# Patient Record
Sex: Female | Born: 1960 | Race: Black or African American | Hispanic: No | Marital: Married | Smoking: Never smoker
Health system: Southern US, Community
[De-identification: ages and names within clinical notes are randomized; demographics above are authoritative.]

## PROBLEM LIST (undated history)

## (undated) DIAGNOSIS — I10 Essential (primary) hypertension: Secondary | ICD-10-CM

## (undated) DIAGNOSIS — Z86010 Personal history of colonic polyps: Secondary | ICD-10-CM

## (undated) DIAGNOSIS — IMO0002 Reserved for concepts with insufficient information to code with codable children: Secondary | ICD-10-CM

## (undated) DIAGNOSIS — G43909 Migraine, unspecified, not intractable, without status migrainosus: Secondary | ICD-10-CM

## (undated) DIAGNOSIS — F419 Anxiety disorder, unspecified: Secondary | ICD-10-CM

## (undated) DIAGNOSIS — D649 Anemia, unspecified: Secondary | ICD-10-CM

## (undated) DIAGNOSIS — I499 Cardiac arrhythmia, unspecified: Secondary | ICD-10-CM

## (undated) DIAGNOSIS — R87619 Unspecified abnormal cytological findings in specimens from cervix uteri: Secondary | ICD-10-CM

## (undated) DIAGNOSIS — E785 Hyperlipidemia, unspecified: Secondary | ICD-10-CM

## (undated) DIAGNOSIS — T7840XA Allergy, unspecified, initial encounter: Secondary | ICD-10-CM

## (undated) DIAGNOSIS — Z8619 Personal history of other infectious and parasitic diseases: Secondary | ICD-10-CM

## (undated) DIAGNOSIS — Z9889 Other specified postprocedural states: Secondary | ICD-10-CM

## (undated) DIAGNOSIS — G629 Polyneuropathy, unspecified: Secondary | ICD-10-CM

## (undated) DIAGNOSIS — K219 Gastro-esophageal reflux disease without esophagitis: Secondary | ICD-10-CM

## (undated) DIAGNOSIS — M545 Low back pain, unspecified: Secondary | ICD-10-CM

## (undated) DIAGNOSIS — I341 Nonrheumatic mitral (valve) prolapse: Secondary | ICD-10-CM

## (undated) DIAGNOSIS — Z9289 Personal history of other medical treatment: Secondary | ICD-10-CM

## (undated) DIAGNOSIS — R112 Nausea with vomiting, unspecified: Secondary | ICD-10-CM

## (undated) DIAGNOSIS — I2510 Atherosclerotic heart disease of native coronary artery without angina pectoris: Secondary | ICD-10-CM

## (undated) HISTORY — DX: Personal history of other infectious and parasitic diseases: Z86.19

## (undated) HISTORY — PX: COLONOSCOPY: SHX174

## (undated) HISTORY — DX: Low back pain: M54.5

## (undated) HISTORY — DX: Nonrheumatic mitral (valve) prolapse: I34.1

## (undated) HISTORY — DX: Anemia, unspecified: D64.9

## (undated) HISTORY — DX: Gastro-esophageal reflux disease without esophagitis: K21.9

## (undated) HISTORY — DX: Unspecified abnormal cytological findings in specimens from cervix uteri: R87.619

## (undated) HISTORY — PX: CRYOABLATION: SHX1415

## (undated) HISTORY — DX: Other specified postprocedural states: R11.2

## (undated) HISTORY — DX: Atherosclerotic heart disease of native coronary artery without angina pectoris: I25.10

## (undated) HISTORY — PX: WRIST SURGERY: SHX841

## (undated) HISTORY — DX: Anxiety disorder, unspecified: F41.9

## (undated) HISTORY — PX: CATARACT EXTRACTION, BILATERAL: SHX1313

## (undated) HISTORY — DX: Other specified postprocedural states: Z98.890

## (undated) HISTORY — DX: Polyneuropathy, unspecified: G62.9

## (undated) HISTORY — DX: Essential (primary) hypertension: I10

## (undated) HISTORY — DX: Personal history of other medical treatment: Z92.89

## (undated) HISTORY — DX: Nausea with vomiting, unspecified: R11.2

## (undated) HISTORY — DX: Low back pain, unspecified: M54.50

## (undated) HISTORY — DX: Reserved for concepts with insufficient information to code with codable children: IMO0002

## (undated) HISTORY — PX: ESOPHAGOGASTRODUODENOSCOPY: SHX1529

## (undated) HISTORY — DX: Migraine, unspecified, not intractable, without status migrainosus: G43.909

## (undated) HISTORY — DX: Hyperlipidemia, unspecified: E78.5

## (undated) HISTORY — DX: Allergy, unspecified, initial encounter: T78.40XA

## (undated) HISTORY — DX: Personal history of colonic polyps: Z86.010

## (undated) HISTORY — PX: HYSTEROSCOPY: SHX211

---

## 1997-11-30 ENCOUNTER — Other Ambulatory Visit: Admission: RE | Admit: 1997-11-30 | Discharge: 1997-11-30 | Payer: Self-pay | Admitting: Obstetrics and Gynecology

## 2004-07-02 ENCOUNTER — Ambulatory Visit: Payer: Self-pay | Admitting: Pulmonary Disease

## 2005-03-31 ENCOUNTER — Ambulatory Visit: Payer: Self-pay | Admitting: Pulmonary Disease

## 2007-04-29 DIAGNOSIS — Z9289 Personal history of other medical treatment: Secondary | ICD-10-CM

## 2007-04-29 HISTORY — DX: Personal history of other medical treatment: Z92.89

## 2008-01-24 ENCOUNTER — Telehealth: Payer: Self-pay | Admitting: Pulmonary Disease

## 2008-01-24 DIAGNOSIS — T7840XA Allergy, unspecified, initial encounter: Secondary | ICD-10-CM | POA: Insufficient documentation

## 2008-01-24 DIAGNOSIS — M545 Low back pain: Secondary | ICD-10-CM

## 2008-01-24 DIAGNOSIS — E7800 Pure hypercholesterolemia, unspecified: Secondary | ICD-10-CM

## 2008-01-24 DIAGNOSIS — I10 Essential (primary) hypertension: Secondary | ICD-10-CM | POA: Insufficient documentation

## 2008-01-24 DIAGNOSIS — K219 Gastro-esophageal reflux disease without esophagitis: Secondary | ICD-10-CM | POA: Insufficient documentation

## 2008-01-25 ENCOUNTER — Ambulatory Visit: Payer: Self-pay | Admitting: Pulmonary Disease

## 2008-01-25 DIAGNOSIS — J329 Chronic sinusitis, unspecified: Secondary | ICD-10-CM | POA: Insufficient documentation

## 2008-01-25 DIAGNOSIS — J0100 Acute maxillary sinusitis, unspecified: Secondary | ICD-10-CM

## 2008-01-25 DIAGNOSIS — J019 Acute sinusitis, unspecified: Secondary | ICD-10-CM | POA: Insufficient documentation

## 2008-01-25 DIAGNOSIS — F411 Generalized anxiety disorder: Secondary | ICD-10-CM | POA: Insufficient documentation

## 2008-01-25 DIAGNOSIS — I059 Rheumatic mitral valve disease, unspecified: Secondary | ICD-10-CM | POA: Insufficient documentation

## 2008-04-11 ENCOUNTER — Telehealth (INDEPENDENT_AMBULATORY_CARE_PROVIDER_SITE_OTHER): Payer: Self-pay | Admitting: *Deleted

## 2008-04-27 ENCOUNTER — Telehealth (INDEPENDENT_AMBULATORY_CARE_PROVIDER_SITE_OTHER): Payer: Self-pay | Admitting: *Deleted

## 2008-06-23 ENCOUNTER — Telehealth (INDEPENDENT_AMBULATORY_CARE_PROVIDER_SITE_OTHER): Payer: Self-pay | Admitting: *Deleted

## 2008-06-26 ENCOUNTER — Telehealth (INDEPENDENT_AMBULATORY_CARE_PROVIDER_SITE_OTHER): Payer: Self-pay | Admitting: *Deleted

## 2008-08-04 ENCOUNTER — Encounter: Payer: Self-pay | Admitting: Pulmonary Disease

## 2009-01-04 ENCOUNTER — Ambulatory Visit: Payer: Self-pay | Admitting: Pulmonary Disease

## 2009-01-04 DIAGNOSIS — R0609 Other forms of dyspnea: Secondary | ICD-10-CM

## 2009-01-04 DIAGNOSIS — R0989 Other specified symptoms and signs involving the circulatory and respiratory systems: Secondary | ICD-10-CM

## 2009-01-05 ENCOUNTER — Telehealth: Payer: Self-pay | Admitting: Pulmonary Disease

## 2009-01-05 ENCOUNTER — Telehealth (INDEPENDENT_AMBULATORY_CARE_PROVIDER_SITE_OTHER): Payer: Self-pay | Admitting: *Deleted

## 2009-01-05 DIAGNOSIS — J309 Allergic rhinitis, unspecified: Secondary | ICD-10-CM | POA: Insufficient documentation

## 2009-01-12 ENCOUNTER — Telehealth: Payer: Self-pay | Admitting: Adult Health

## 2009-01-12 ENCOUNTER — Ambulatory Visit: Payer: Self-pay | Admitting: Pulmonary Disease

## 2009-01-12 ENCOUNTER — Encounter: Payer: Self-pay | Admitting: Adult Health

## 2009-02-19 ENCOUNTER — Telehealth (INDEPENDENT_AMBULATORY_CARE_PROVIDER_SITE_OTHER): Payer: Self-pay | Admitting: *Deleted

## 2009-04-19 ENCOUNTER — Telehealth: Payer: Self-pay | Admitting: Pulmonary Disease

## 2009-05-15 ENCOUNTER — Ambulatory Visit: Payer: Self-pay | Admitting: Pulmonary Disease

## 2009-07-27 ENCOUNTER — Telehealth (INDEPENDENT_AMBULATORY_CARE_PROVIDER_SITE_OTHER): Payer: Self-pay | Admitting: *Deleted

## 2009-08-02 ENCOUNTER — Telehealth: Payer: Self-pay | Admitting: Pulmonary Disease

## 2009-08-07 ENCOUNTER — Encounter: Payer: Self-pay | Admitting: Pulmonary Disease

## 2009-08-22 ENCOUNTER — Encounter: Payer: Self-pay | Admitting: Pulmonary Disease

## 2009-08-28 ENCOUNTER — Encounter: Payer: Self-pay | Admitting: Pulmonary Disease

## 2009-08-31 ENCOUNTER — Encounter: Payer: Self-pay | Admitting: Pulmonary Disease

## 2010-02-19 ENCOUNTER — Encounter (INDEPENDENT_AMBULATORY_CARE_PROVIDER_SITE_OTHER): Payer: Self-pay | Admitting: *Deleted

## 2010-05-28 NOTE — Letter (Signed)
Summary: Pre Visit Letter Revised  [facility]  [address] [place], [place] [address]   Phone: [phone]  Fax: [phone]        02/19/2010 MRN: [id number] [facility] [address] [place], [place]  [address]             Procedure Date:  04/02/2010   Welcome to the Gastroenterology Division at [facility].    You are scheduled to see a nurse for your pre-procedure visit on 03/26/2010 at 8:00AM on the 3rd floor at [facility], [address].  We ask that you try to arrive at our office 15 minutes prior to your appointment time to allow for check-in.  Please take a minute to review the attached form.  If you answer "Yes" to one or more of the questions on the first page, we ask that you call the person listed at your earliest opportunity.  If you answer "No" to all of the questions, please complete the rest of the form and bring it to your appointment.    Your nurse visit will consist of discussing your medical and surgical history, your immediate family medical history, and your medications.   If you are unable to list all of your medications on the form, please bring the medication bottles to your appointment and we will list them.  We will need to be aware of both prescribed and over the counter drugs.  We will need to know exact dosage information as well.    Please be prepared to read and sign documents such as consent forms, a financial agreement, and acknowledgement forms.  If necessary, and with your consent, a friend or relative is welcome to sit-in on the nurse visit with you.  Please bring your insurance card so that we may make a copy of it.  If your insurance requires a referral to see a specialist, please bring your referral form from your primary care physician.  No co-pay is required for this nurse visit.     If you cannot keep your appointment, please call [phone] to cancel or reschedule prior to your appointment date.  This  allows [name] the opportunity to schedule an appointment for another patient in need of care.    Thank you for choosing [facility] for your medical needs.  We appreciate the opportunity to care for you.  Please visit [name] at our website  to learn more about our practice.  Sincerely, The Gastroenterology Division

## 2010-05-28 NOTE — Assessment & Plan Note (Signed)
Summary: ROV ///kp   Primary Care Provider:  Dr. Alroy Dust  CC:  16 month ROV & review of mult medical problems....  History of Present Illness: 50 y/o BF here for a follow up visit... she has hx of HBP & Hyperchol followed regularly by [facility] and she says that he does all her lab work... she also has Allergies (DrSharma), and GERD/ LER prev eval by DrByers on Protonix...   ~  January 25, 2008:  she presents today after a 2.5 yr hiatus because her new insurance plan requires a $62 co-pay for the specialist, and I am her primary... c/o sinus infection w/ headache, facial pain and pressure, congestion, drainage, sl sore throat... she denies fever, cough, discolored phlegm... she states she knows it's her allergies because she is forced to walk past the smokers outside at work and this always bothers her allergies...    ~  May 15, 2009:  returns for routine ROV w/ no new complaints or concerns... she needs new Rx for her Allegra & Protonix... she continues to see [facility] yearly for her MVP, HBP, dyslipidemia, & +FamHx CAD (last seen 4/10)- she tells me that he does all of her blood work & she doesn't want to have any labs repeated here... she saw TP 9/10 for work-in appt due to allergic rhinitis requiring Pred & Nasacort to resolve...  DrMcPhail is her GYN... she will be 49 this month & wants to proceed w/ colonoscopy.    Current Problem List:  ALLERGY (ICD-995.3) - followed by DrSharma on ALLEGRA 180mg /d... hx pos allergy testing over the yrs w/ + reactions to pollen, tress, grasses, ragweed, molds, dust, dogs/cats, & some foods peanuts & spinach were the worst for her... she also uses NASACORT AQ Prn.  HYPERTENSION (ICD-401.9) - followed by Howell Rucks on TENORMIN 50mg /d...  BP= 122/68 here & similar at home, takes med regularly 7 tol well... denies HA, visual changes, CP, palipit, dizziness, syncope, dyspnea, edema, etc...   ~  2DEcho 2/06 showed norm LVwall thickness & LVF, borderline MVP/  LAE/ tr MR...  ~  Cardiolite 12/07 showed norm perfusion and no ischemia...  Hx of MITRAL VALVE PROLAPSE (ICD-424.0) - followed by Howell Rucks.  HYPERCHOLESTEROLEMIA (ICD-272.0) - also followed by [facility] on ZOCOR 40mg /d... he does all her lab work...  ~  4/10: note from drKelly indicates FLP- TChol 169, TG 66, HDL 79, LDL 83  GERD (ICD-530.81) & LER - prev eval by DrByers for ENT and well controlled on PROTONIX 40mg /d (she prefers this to Nexium)...  BACK PAIN, LUMBAR (ICD-724.2) - she treats this w/ OTC Advil/ Tylenol which works well...  ANXIETY (ICD-300.00) - on XANAX 0.5mg  Prn... she's been under mod stress (works for a [employer])...  Health Maintenance - she sees [facility]Phail in [facility] for [facility]YN (last 10/10 & she reports all neg)... Mammograms at [facility]... she will be 49 this month & wants to proceed w/ her routine colonoscopy...    Allergies: 1)  ! Sulfa  Comments:  Nurse/Medical Assistant: The patient's medications and allergies were reviewed with the patient and were updated in the Medication and Allergy Lists.  Past History:  Past Medical History:  ALLERGIC RHINITIS (ICD-477.9) HYPERTENSION (ICD-401.9) MITRAL VALVE PROLAPSE (ICD-424.0) HYPERCHOLESTEROLEMIA (ICD-272.0) GERD (ICD-530.81) BACK PAIN, LUMBAR (ICD-724.2) ANXIETY (ICD-300.00)  Past Surgical History: Hysteroscopy Wrist surgery C-Section x 2  Family History: Reviewed history from 01/25/2008 and no changes required. Mother passed at age 13 from breast ca, hx of heart dz & DM. Father has  prostate cancer Sister has DM  Social History: Reviewed history from 01/25/2008 and no changes required. Never smoker No ETOH Married 2 Investment banker, operational  Review of Systems      See HPI  The patient denies anorexia, fever, weight loss, weight gain, vision loss, decreased hearing, hoarseness, chest pain, syncope, dyspnea on exertion, peripheral edema, prolonged cough, headaches, hemoptysis,  abdominal pain, melena, hematochezia, severe indigestion/heartburn, hematuria, incontinence, muscle weakness, suspicious skin lesions, transient blindness, difficulty walking, depression, unusual weight change, abnormal bleeding, enlarged lymph nodes, and angioedema.    Vital Signs:  Patient profile:   50 year old female Height:      63 inches Weight:      149 pounds BMI:     26.49 O2 Sat:      100 % on Room air Temp:     97.3 degrees F oral Pulse rate:   58 / minute BP sitting:   122 / 68  (left arm) Cuff size:   regular  Vitals Entered By: Randell Loop CMA (May 15, 2009 3:01 PM)  O2 Sat at Rest %:  100 O2 Flow:  Room air CC: 16 month ROV & review of mult medical problems... Is Patient Diabetic? No Pain Assessment Patient in pain? no      Comments meds updated today   Physical Exam  Additional Exam:  WD, WN, 50 y/o BF in NAD... GENERAL:  Alert & oriented; pleasant & cooperative... HEENT:  /AT, EOM-wnl, PERRLA, EACs-clear, TMs-wnl, NOSE-clear, THROAT-clear & wnl. NECK:  Supple w/ full ROM; no JVD; normal carotid impulses w/o bruits; no thyromegaly or nodules palpated; no lymphadenopathy. CHEST:  Clear to P & A; no wheezes, rales, or rhonchi heard... HEART:  Regular Rhythm; without murmurs/ rubs/ or gallops detected. ABDOMEN:  Soft & nontender; normal bowel sounds; no organomegaly or masses palpated. EXT: without deformities or arthritic changes; no varicose veins/ venous insuffic/ or edema. NEURO:  CN's intact; no focal neuro deficits... DERM:  No lesions noted; no rash etc...     Impression & Recommendations:  Problem # 1:  ALLERGIC RHINITIS (ICD-477.9) Chr problem... she takes allegra daily & requests refill... OK. Her updated medication list for this problem includes:    Allegra 180 Mg Tabs (Fexofenadine hcl) .[name]... Take 1 tablet by mouth once a day  Problem # 2:  HYPERTENSION (ICD-401.9) Controlled-  same Rx. Her updated medication list for this problem  includes:    Tenormin 50 Mg Tabs (Atenolol) .[name]... 1 once daily  Problem # 3:  MITRAL VALVE PROLAPSE (ICD-424.0) Stable-  she is currently asymptomatic... followed by Howell Rucks who does all her labs. Her updated medication list for this problem includes:    Tenormin 50 Mg Tabs (Atenolol) .[name]... 1 once daily  Problem # 4:  HYPERCHOLESTEROLEMIA (ICD-272.0) Followed by [facility] & he writes the Zocor... Her updated medication list for this problem includes:    Zocor 40 Mg Tabs (Simvastatin) .[name]... Take 1 tablet by mouth once a day  Problem # 5:  GERD (ICD-530.81) Stable on the Protonix-  refilled today. Her updated medication list for this problem includes:    Protonix 40 Mg Tbec (Pantoprazole sodium) .[name]... Take 1 tab by mouth once daily (30 min before the 1st meal of the day)  Orders: Gastroenterology Referral (GI)  Problem # 6:  ANXIETY (ICD-300.00) Stable-  she uses the Xanax sparingly & manages well... Her updated medication list for this problem includes:    Xanax 0.5 Mg Tabs (Alprazolam) .[name]... 1/2 to  1 by mouth three times a day as needed for nerves  Complete Medication List: 1)  Allegra 180 Mg Tabs (Fexofenadine hcl) .... Take 1 tablet by mouth once a day 2)  Tenormin 50 Mg Tabs (Atenolol) .[name].. 1 once daily 3)  Zocor 40 Mg Tabs (Simvastatin) .... Take 1 tablet by mouth once a day 4)  Protonix 40 Mg Tbec (Pantoprazole sodium) .... Take 1 tab by mouth once daily (30 min before the 1st meal of the day) 5)  Bcp  .... As directed 6)  Xanax 0.5 Mg Tabs (Alprazolam) .... 1/2 to 1 by mouth three times a day as needed for nerves  Other Orders: Prescription Created Electronically [id number])  Patient Instructions: 1)  Today we updated your med list- see below.... 2)  We refilled your Allegra & Protonix as requested... 3)  We will set up a referral to out GI Dept for a routine colonoscopy to be sched at your convenience... 4)  Call for any problems.Marland KitchenMarland Kitchen 5)  Please schedule a follow-up  appointment in 1 year, sooner as needed. Prescriptions: PROTONIX 40 MG TBEC (PANTOPRAZOLE SODIUM) take 1 tab by mouth once daily (30 min before the 1st meal of the day)  #30 x prn   Entered and Authorized by:   Michele Mcalpine MD   Signed by:   Michele Mcalpine MD on 05/15/2009   Method used:   Print then Give to Patient   RxID:   [id number] ALLEGRA 180 MG TABS (FEXOFENADINE HCL) Take 1 tablet by mouth once a day  #30 x prn   Entered and Authorized by:   Michele Mcalpine MD   Signed by:   Michele Mcalpine MD on 05/15/2009   Method used:   Print then Give to Patient   RxID:   [id number]

## 2010-05-28 NOTE — Progress Notes (Signed)
Summary: wants stonger meds  Phone Note Call from Patient Call back at [phone]   Caller: Patient Call For: nadel Summary of Call: needs dr Kirstie Mirza nurse to call patient. was given nasonex but its not working. patient wants something stonger for allergies  Initial call taken by: Valinda Hoar,  July 27, 2009 12:22 PM  Follow-up for Phone Call        Pt is requesting another nasal spray, she states nasonex is not helping with her allergies at all. Pt is also taking allegra daily. Please advise.Carron Curie CMA  July 27, 2009 1:43 PM [facility]  Additional Follow-up for Phone Call Additional follow up Details #1::        per SN---needs allergy tests....change nasonex to veramist  2 sprays each nostril at bedtime .  thanks Randell Loop CMA  July 27, 2009 2:17 PM     Additional Follow-up for Phone Call Additional follow up Details #2::    Spoke with pt and advised of SN's recs.  Pt states that she already sees an allergist and she refuses to have allergy testing due to cost.  I advised that she should at least see her allergist to discuss allergy issues and that we sent rx for veramyst to pharm.  Pt verbalized understanding. Follow-up by: Vernie Murders,  July 27, 2009 2:34 PM  New/Updated Medications: VERAMYST 27.5 MCG/SPRAY SUSP (FLUTICASONE FUROATE) 2 sprays each nostril at bedtime Prescriptions: VERAMYST 27.5 MCG/SPRAY SUSP (FLUTICASONE FUROATE) 2 sprays each nostril at bedtime  #1 x 6   Entered by:   Vernie Murders   Authorized by:   Michele Mcalpine MD   Signed by:   Vernie Murders on 07/27/2009   Method used:   Electron[facility] to        [facility]  [phone]* (retail)       [address] [place], [place]  [address]       Ph: [phone] or [phone]       Fax: [phone]   RxID:   [id number]

## 2010-05-28 NOTE — Letter (Signed)
Summary: [facility]   Imported By: Sherian Rein 09/10/2009 11:35:38  _____________________________________________________________________  External Attachment:    Type:   Image     Comment:   External Document

## 2010-05-28 NOTE — Medication Information (Signed)
Summary: Tax adviser   Imported By: Lehman Prom 08/07/2009 16:28:58  _____________________________________________________________________  External Attachment:    Type:   Image     Comment:   External Document

## 2010-05-28 NOTE — Progress Notes (Signed)
SummaryMadilyn Hook PA  Phone Note Outgoing Call   Call placed by: Vernie Murders,  August 02, 2009 3:21 PM Call placed to: Insurer Summary of Call: Spoke with [employer] to initiate PA for veramyst.  Will await fax to be sent to triage fax. Initial call taken by: Vernie Murders,  August 02, 2009 3:21 PM  Follow-up for Phone Call        received approval for veramyst 08/07/09-05/02/2012. pharmacy, [facility],  and pt aware.  Carron Curie CMA  August 06, 2009 5:27 PM

## 2010-07-05 ENCOUNTER — Encounter (INDEPENDENT_AMBULATORY_CARE_PROVIDER_SITE_OTHER): Payer: Self-pay | Admitting: *Deleted

## 2010-07-09 NOTE — Letter (Signed)
Summary: Pre Visit Letter Revised  [facility]  [address] [place], [place] [address]   Phone: [phone]  Fax: [phone]        07/05/2010 MRN: [id number] [facility] [address] [place], [place]  [address]             Procedure Date:  August 15, 2010   dir col Dr Leone Payor   Welcome to the Gastroenterology Division at [facility].    You are scheduled to see a nurse for your pre-procedure visit on August 02, 2010 at 10:00am on the 3rd floor at [facility], [address].  We ask that you try to arrive at our office 15 minutes prior to your appointment time to allow for check-in.  Please take a minute to review the attached form.  If you answer "Yes" to one or more of the questions on the first page, we ask that you call the person listed at your earliest opportunity.  If you answer "No" to all of the questions, please complete the rest of the form and bring it to your appointment.    Your nurse visit will consist of discussing your medical and surgical history, your immediate family medical history, and your medications.   If you are unable to list all of your medications on the form, please bring the medication bottles to your appointment and we will list them.  We will need to be aware of both prescribed and over the counter drugs.  We will need to know exact dosage information as well.    Please be prepared to read and sign documents such as consent forms, a financial agreement, and acknowledgement forms.  If necessary, and with your consent, a friend or relative is welcome to sit-in on the nurse visit with you.  Please bring your insurance card so that we may make a copy of it.  If your insurance requires a referral to see a specialist, please bring your referral form from your primary care physician.  No co-pay is required for this nurse visit.     If you cannot keep your appointment, please call [phone] to cancel or reschedule prior to  your appointment date.  This allows [name] the opportunity to schedule an appointment for another patient in need of care.    Thank you for choosing [facility] for your medical needs.  We appreciate the opportunity to care for you.  Please visit [name] at our website  to learn more about our practice.  Sincerely, The Gastroenterology Division

## 2010-08-01 ENCOUNTER — Ambulatory Visit (AMBULATORY_SURGERY_CENTER): Payer: BC Managed Care – PPO | Admitting: *Deleted

## 2010-08-01 VITALS — Ht 63.0 in | Wt 155.1 lb

## 2010-08-01 DIAGNOSIS — Z1211 Encounter for screening for malignant neoplasm of colon: Secondary | ICD-10-CM

## 2010-08-01 MED ORDER — PEG-KCL-NACL-NASULF-NA ASC-C 100 G PO SOLR: 1.0000 | Freq: Once | ORAL | Status: AC

## 2010-08-01 NOTE — Progress Notes (Signed)
Pt says that she gets nauseated w/ anesthesia. Ezra Sites

## 2010-08-14 ENCOUNTER — Encounter: Payer: Self-pay | Admitting: Internal Medicine

## 2010-08-15 ENCOUNTER — Encounter: Payer: Self-pay | Admitting: Internal Medicine

## 2010-08-15 ENCOUNTER — Ambulatory Visit (AMBULATORY_SURGERY_CENTER): Payer: BC Managed Care – PPO | Admitting: Internal Medicine

## 2010-08-15 VITALS — BP 154/78 | HR 103 | Temp 96.9°F | Resp 20 | Ht 63.0 in | Wt 155.0 lb

## 2010-08-15 DIAGNOSIS — Z860100 Personal history of colon polyps, unspecified: Secondary | ICD-10-CM

## 2010-08-15 DIAGNOSIS — D126 Benign neoplasm of colon, unspecified: Secondary | ICD-10-CM

## 2010-08-15 DIAGNOSIS — Z1211 Encounter for screening for malignant neoplasm of colon: Secondary | ICD-10-CM

## 2010-08-15 HISTORY — DX: Personal history of colon polyps, unspecified: Z86.0100

## 2010-08-15 HISTORY — DX: Personal history of colonic polyps: Z86.010

## 2010-08-15 MED ORDER — SODIUM CHLORIDE 0.9 % IV SOLN: 500.0000 mL | INTRAVENOUS | Status: DC

## 2010-08-15 NOTE — Patient Instructions (Signed)
Discharged  instructions given with verbal understanding. Handout on polyps given. No aspirin or aspirin products for two weeks.

## 2010-08-16 ENCOUNTER — Telehealth: Payer: Self-pay

## 2010-08-16 NOTE — Telephone Encounter (Signed)
No ID on  Answering machine. Unable to leave message.

## 2010-08-20 NOTE — Progress Notes (Signed)
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

## 2010-08-28 ENCOUNTER — Telehealth: Payer: Self-pay | Admitting: Internal Medicine

## 2010-08-28 NOTE — Telephone Encounter (Signed)
Ok Maybe not Should not be an issue until 3 years from now - it can be up to her then Letter on way benign polyp 3 year repeat

## 2010-08-28 NOTE — Telephone Encounter (Signed)
Patient advised.

## 2010-08-28 NOTE — Telephone Encounter (Signed)
Patient doesn't feel that she does need deeper sedation.  She doesn't remember a thing about the procedure.  She says her husband told her that in the recovery room sh told him she felt the entire procedure.  Patient states "I don't remember a thing".

## 2010-09-16 ENCOUNTER — Ambulatory Visit (INDEPENDENT_AMBULATORY_CARE_PROVIDER_SITE_OTHER): Payer: BC Managed Care – PPO | Admitting: Adult Health

## 2010-09-16 ENCOUNTER — Encounter: Payer: Self-pay | Admitting: Adult Health

## 2010-09-16 DIAGNOSIS — J069 Acute upper respiratory infection, unspecified: Secondary | ICD-10-CM | POA: Insufficient documentation

## 2010-09-16 MED ORDER — AZITHROMYCIN 250 MG PO TABS: 250.0000 mg | ORAL_TABLET | Freq: Every day | ORAL | Status: DC

## 2010-09-16 MED ORDER — ALBUTEROL SULFATE (2.5 MG/3ML) 0.083% IN NEBU
2.5000 mg | INHALATION_SOLUTION | Freq: Once | RESPIRATORY_TRACT | Status: AC
  Administered 2010-09-16: 2.5 mg via RESPIRATORY_TRACT

## 2010-09-16 NOTE — Assessment & Plan Note (Signed)
Zpack take as directed.  Mucinex DM Twice daily  As needed  Cough/congestion Fluids and rest.  Saline nasal rinses As needed   Afrin 2 puffs Twice daily  For 3 days Please contact office for sooner follow up if symptoms do not improve or worsen or seek emergency care  follow up Dr. Nadel  In 3 months  

## 2010-09-16 NOTE — Progress Notes (Signed)
  Subjective:    Patient ID: Grace George, female    DOB: [birth year], 50 y.o.   MRN: [id number]  HPI 50 y/o AAF with known hx of HBP & Hyperchol followed regularly by [facility] and she says that he does all her lab work... she also has Allergies (DrSharma), and GERD/ LER prev eval by DrByers on Protonix...  09/16/10 Acute OV Presents for work in visit. Complains of sinus pressure/congestion, yellow nasal drainage, PND w/ prod cough, tightness in chest, sneezing, chills x 4 days. She was on cruise last week. Exposed to smoke (cigarettes ) that have aggravated her allergies.  Has used advil without much help. Lots of congestion and drainage. Is getting worse since yesterday.     Review of Systems Constitutional:   No  weight loss, night sweats,  Fevers, chills, fatigue, or  lassitude.  HEENT:   No headaches,  Difficulty swallowing,  Tooth/dental problems, or  Sore throat,             + sneezing, itching, ear ache, nasal congestion, post nasal drip,   CV:  No chest pain,  Orthopnea, PND, swelling in lower extremities, anasarca, dizziness, palpitations, syncope.   GI  No heartburn, indigestion, abdominal pain, nausea, vomiting, diarrhea, change in bowel habits, loss of appetite, bloody stools.   Resp: No shortness of breath with exertion or at rest.   +non-productive cough,  No coughing up of blood.  + change in color of mucus.  No wheezing.  No chest wall deformity  Skin: no rash or lesions.  GU: no dysuria, change in color of urine, no urgency or frequency.  No flank pain, no hematuria   MS:  No joint pain or swelling.  No decreased range of motion.  No back pain.  Psych:  No change in mood or affect. No depression or anxiety.  No memory loss.         Objective:   Physical Exam        Assessment & Plan:

## 2010-09-16 NOTE — Progress Notes (Signed)
Addended by: Gweneth Dimitri on: 09/16/2010 12:02 PM   Modules accepted: Orders

## 2010-09-16 NOTE — Patient Instructions (Signed)
Zpack take as directed.  Mucinex DM Twice daily  As needed  Cough/congestion Fluids and rest.  Saline nasal rinses As needed   Afrin 2 puffs Twice daily  For 3 days Please contact office for sooner follow up if symptoms do not improve or worsen or seek emergency care  follow up Dr. Kriste Basque  In 3 months

## 2010-12-17 ENCOUNTER — Ambulatory Visit: Payer: BC Managed Care – PPO | Admitting: Pulmonary Disease

## 2011-06-27 DIAGNOSIS — Z9289 Personal history of other medical treatment: Secondary | ICD-10-CM

## 2011-06-27 HISTORY — DX: Personal history of other medical treatment: Z92.89

## 2011-09-01 ENCOUNTER — Ambulatory Visit (INDEPENDENT_AMBULATORY_CARE_PROVIDER_SITE_OTHER): Payer: BC Managed Care – PPO | Admitting: Adult Health

## 2011-09-01 ENCOUNTER — Encounter: Payer: Self-pay | Admitting: Adult Health

## 2011-09-01 VITALS — BP 128/80 | HR 54 | Temp 98.0°F | Ht 63.0 in | Wt 157.8 lb

## 2011-09-01 DIAGNOSIS — J069 Acute upper respiratory infection, unspecified: Secondary | ICD-10-CM

## 2011-09-01 MED ORDER — AZITHROMYCIN 250 MG PO TABS: ORAL_TABLET | ORAL | Status: AC

## 2011-09-01 MED ORDER — PANTOPRAZOLE SODIUM 40 MG PO TBEC: 40.0000 mg | DELAYED_RELEASE_TABLET | ORAL | Status: DC | PRN

## 2011-09-01 MED ORDER — HYDROCODONE-HOMATROPINE 5-1.5 MG/5ML PO SYRP: 5.0000 mL | ORAL_SOLUTION | Freq: Four times a day (QID) | ORAL | Status: AC | PRN

## 2011-09-01 NOTE — Assessment & Plan Note (Signed)
Zpack take as directed. -to have on hold if symptoms worsen with discolored mucus .  Mucinex DM Twice daily  As needed  Cough/congestion Fluids and rest.  Saline nasal rinses As needed   Hydromet 1-2 tsp every 4-6 hr As needed  Cough- may make you sleepy.  Afrin 2 puffs Twice daily  For 3 days Please contact office for sooner follow up if symptoms do not improve or worsen or seek emergency care  follow up Dr. Kriste Basque  In 3 months

## 2011-09-01 NOTE — Progress Notes (Signed)
  Subjective:    Patient ID: Grace George, female    DOB: 27-Nov-1960, 51 y.o.   MRN: [id number]  HPI 51 y/o AAF with known hx of HBP & Hyperchol followed regularly by [facility] and she says that he does all her lab work... she also has Allergies (DrSharma), and GERD/ LER prev eval by DrByers on Protonix...  09/01/2011 Acute OV  Complains of sore throat, dry cough, chest congestion, esp at ngiht  x2 days . Taking otc cold meds without much relief. Husband has same symptoms. Has a lot of drainage despite taking Allegra . No fever or chest pain . Has not seen Dr. Kriste Basque  In > 2years . Had colonoscopy last year. Advised to make routine follow up with Dr. Kriste Basque     Review of Systems Constitutional:   No  weight loss, night sweats,  Fevers, chills, fatigue, or  lassitude.  HEENT:   No headaches,  Difficulty swallowing,  Tooth/dental problems, or  Sore throat,             + sneezing, itching, ear ache, nasal congestion, post nasal drip,   CV:  No chest pain,  Orthopnea, PND, swelling in lower extremities, anasarca, dizziness, palpitations, syncope.   GI  No heartburn, indigestion, abdominal pain, nausea, vomiting, diarrhea, change in bowel habits, loss of appetite, bloody stools.   Resp: No shortness of breath with exertion or at rest.   +non-productive cough,  No coughing up of blood.  + change in color of mucus.  No wheezing.  No chest wall deformity  Skin: no rash or lesions.  GU: no dysuria, change in color of urine, no urgency or frequency.  No flank pain, no hematuria   MS:  No joint pain or swelling.  No decreased range of motion.  No back pain.  Psych:  No change in mood or affect. No depression or anxiety.  No memory loss.         Objective:   Physical Exam GEN: A/Ox3; pleasant , NAD, well nourished   HEENT:  [place]/AT,  EACs-clear, TMs-wnl, NOSE-clear drainage , THROAT-clear, no lesions, no postnasal drip or exudate noted.   NECK:  Supple w/ fair ROM; no JVD; normal carotid  impulses w/o bruits; no thyromegaly or nodules palpated; no lymphadenopathy.  RESP  Clear  P & A; w/o, wheezes/ rales/ or rhonchi.no accessory muscle use, no dullness to percussion  CARD:  RRR, no m/r/g  , no peripheral edema, pulses intact, no cyanosis or clubbing.  GI:   Soft & nt; nml bowel sounds; no organomegaly or masses detected.  Musco: Warm bil, no deformities or joint swelling noted.   Neuro: alert, no focal deficits noted.    Skin: Warm, no lesions or rashes         Assessment & Plan:

## 2011-09-01 NOTE — Patient Instructions (Addendum)
Zpack take as directed. -to have on hold if symptoms worsen with discolored mucus .  Mucinex DM Twice daily  As needed  Cough/congestion Fluids and rest.  Saline nasal rinses As needed   Hydromet 1-2 tsp every 4-6 hr As needed  Cough- may make you sleepy.  Afrin 2 puffs Twice daily  For 3 days Please contact office for sooner follow up if symptoms do not improve or worsen or seek emergency care  follow up Dr. Nadel  In 3 months   

## 2011-09-01 NOTE — Progress Notes (Deleted)
  Subjective:    Patient ID: Grace George, female    DOB: 06-11-1960, 51 y.o.   MRN: [id number]  HPI    Review of Systems     Objective:   Physical Exam        Assessment & Plan:

## 2011-11-27 ENCOUNTER — Ambulatory Visit: Payer: BC Managed Care – PPO | Admitting: Pulmonary Disease

## 2011-12-02 ENCOUNTER — Ambulatory Visit: Payer: BC Managed Care – PPO | Admitting: Pulmonary Disease

## 2011-12-10 ENCOUNTER — Encounter: Payer: Self-pay | Admitting: Pulmonary Disease

## 2011-12-10 ENCOUNTER — Ambulatory Visit (INDEPENDENT_AMBULATORY_CARE_PROVIDER_SITE_OTHER)
Admission: RE | Admit: 2011-12-10 | Discharge: 2011-12-10 | Disposition: A | Discharge status: Home / Self Care | Payer: BC Managed Care – PPO | Source: Ambulatory Visit | Attending: Pulmonary Disease | Admitting: Pulmonary Disease

## 2011-12-10 ENCOUNTER — Ambulatory Visit (INDEPENDENT_AMBULATORY_CARE_PROVIDER_SITE_OTHER): Payer: BC Managed Care – PPO | Admitting: Pulmonary Disease

## 2011-12-10 VITALS — BP 150/80 | HR 56 | Temp 97.5°F | Ht 63.0 in | Wt 157.2 lb

## 2011-12-10 DIAGNOSIS — I059 Rheumatic mitral valve disease, unspecified: Secondary | ICD-10-CM

## 2011-12-10 DIAGNOSIS — K219 Gastro-esophageal reflux disease without esophagitis: Secondary | ICD-10-CM

## 2011-12-10 DIAGNOSIS — Z0000 Encounter for general adult medical examination without abnormal findings: Secondary | ICD-10-CM

## 2011-12-10 DIAGNOSIS — E7800 Pure hypercholesterolemia, unspecified: Secondary | ICD-10-CM

## 2011-12-10 DIAGNOSIS — J309 Allergic rhinitis, unspecified: Secondary | ICD-10-CM

## 2011-12-10 DIAGNOSIS — F411 Generalized anxiety disorder: Secondary | ICD-10-CM

## 2011-12-10 DIAGNOSIS — I10 Essential (primary) hypertension: Secondary | ICD-10-CM

## 2011-12-10 MED ORDER — ALPRAZOLAM 0.5 MG PO TABS: ORAL_TABLET | ORAL | Status: DC

## 2011-12-10 NOTE — Patient Instructions (Addendum)
Today we updated your med list in our EPIC system...    Continue your current medications the same...    We refilled your Alprazolam per request...  Today we did a routine baseline CXR & we will call you w/ the results...  If possible, please request Howell Rucks to send a copy of your 2013 blood work to [name] to review...  Call for any problems.Marland KitchenMarland Kitchen

## 2011-12-10 NOTE — Progress Notes (Signed)
Subjective:     Patient ID: Grace George, female   DOB: 1960-05-17, 51 y.o.   MRN: [id number]  HPI 51 y/o BF here for a follow up visit... she has hx of HBP & Hyperchol followed regularly by [facility] and she says that he does all her lab work... she also has Allergies (DrSharma), and GERD/ LER prev eval by DrByers on Protonix...  ~  January 25, 2008:  she presents today after a 2.5 yr hiatus because her new insurance plan requires a $32 co-pay for the specialist, and I am her primary... c/o sinus infection w/ headache, facial pain and pressure, congestion, drainage, sl sore throat... she denies fever, cough, discolored phlegm... she states she knows it's her allergies because she is forced to walk past the smokers outside at work and this always bothers her allergies...   ~  May 15, 2009:  returns for routine ROV w/ no new complaints or concerns... she needs new Rx for her Allegra & Protonix... she continues to see [facility] yearly for her MVP, HBP, dyslipidemia, & +FamHx CAD (last seen 4/10)- she tells me that he does all of her blood work & she doesn't want to have any labs repeated here... she saw TP 9/10 for work-in appt due to allergic rhinitis requiring Pred & Nasacort to resolve...  DrMcPhail is her GYN... she will be 49 this month & wants to proceed w/ colonoscopy.  ~  December 10, 2011:  7mo ROV & CPX> she has been followed regularly by Howell Rucks, [facility] & she tells me that he does her yearly labs (we have 2012 results but not 2013 values);  Feeling well overall, DrKelly evaluated pt 3/13 w/ atypCP & she had neg Myoview;  She saw TP here 5/13 w/ bronchitis- treated w/ ZPak & resolved;  Currently feeling well- no new complaints or concerns> denies CP, palpit, SOB, cough, phlegm, hemoptysis, etc...     We reviewed prob list, meds, xrays and labs> see below for updates >>   Problem List:    ALLERGY (ICD-995.3) - followed by DrSharma on ALLEGRA 180mg /d... hx pos allergy testing over the yrs w/ +  reactions to pollen, tress, grasses, ragweed, molds, dust, dogs/cats, & some foods peanuts & spinach were the worst for her... she also uses NASACORT AQ (or similar) Prn. ~  She had a bronchitic infection 5/13 treated by TP w/ ZPak, Mucinex, Hydromet & improved... ~  CXR 8/13 showed normal heart size, mild atx otherw neg, NAD...  HYPERTENSION (ICD-401.9) - followed by Howell Rucks on TENORMIN 50mg /d...  ~  2DEcho 2/06 showed norm LVwall thickness & LVF, borderline MVP/ LAE/ tr MR... ~  Cardiolite 12/07 showed norm perfusion and no ischemia... ~  1/11:  BP= 122/68 here & similar at home, takes med regularly 7 tol well... denies HA, visual changes, CP, palipit, dizziness, syncope, dyspnea, edema, etc...  ~  Myoview 3/13 showed breast attenuation but no ischemia & no infarct; freq PVCs & PACs, not gated due to ectopy... ~  8/13:  BP= 150/80 & she currently denies CP, palpit, SOB, edema...  Hx of MITRAL VALVE PROLAPSE (ICD-424.0) - followed by Howell Rucks, [facility]... ~  3/13:  She saw Howell Rucks, [facility]> HxCP & Palpit, neg Myoview in 2007, incr symptoms w/ stress, EKG w/ NSSTTWA, they did labs she says but we don't have results, they did Myoview=> neg x breast attenuation...  HYPERCHOLESTEROLEMIA (ICD-272.0) - also followed by [facility] on ZOCOR 40mg /d... he does all her lab work... ~  4/10: note from  drKelly indicates FLP- TChol 169, TG 66, HDL 79, LDL 83 ~  5/12: labs from [facility] showed TChol 136, TG 51, HDL 65, DL 61  LOW TSH >> this has been followed by [facility] w/ low TSH but normal FreeT3 & FreeT4... ~  Labs 5/12 by Howell Rucks showed TSH= 0.14... She remains clinically euthyroid w/o over- or underactive symptoms...  GERD (ICD-530.81) & LER - prev eval by DrByers for ENT and well controlled on PROTONIX 40mg /d (she prefers this to Nexium)...  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  BACK PAIN, LUMBAR (ICD-724.2) - she treats  this w/ OTC Advil/ Tylenol which works well...  OSTEOPENIA & VITAMIN D DEFICIENCY >> ~  ?Baseline BMD testing by Gyn, we don't have results, she tells me she takes VitD 50K every other week...  ANXIETY (ICD-300.00) - on XANAX 0.5mg  Prn... she's been under mod stress (works for a [employer])...  Health Maintenance >>  ~  GI:  Followed by DrGessner w/ colonoscopy 4/12 showing an adenomatous polyp 7 f/u due 5 yrs. ~  GYN:  Prev followed by DrMcPhail in [employer] for GYN (last 10/10 & she reports all neg); [facility] & saw MrsLeonard,PA; M[facility]ograms [facility] & she says up to date... ~  Immuniz:  She is advised to get the Flu shot yearly;  She had Tetanus vaccine 2012...   Past Medical History  Diagnosis Date  . Allergy   . GERD (gastroesophageal reflux disease)   . MVP (mitral valve prolapse)   . Hyperlipidemia   . CAD (coronary artery disease)   . Allergic rhinitis   . HTN (hypertension)   . Lumbar back pain   . Anxiety     Past Surgical History  Procedure Date  . Cesarean section     twice  . Wrist surgery     right  . Hysteroscopy     Outpatient Encounter Prescriptions as of 12/10/2011  Medication Sig Dispense Refill  . ALPRAZolam (XANAX) 0.5 MG tablet 1/2 -1 tab by mouth three times daily as needed for nerves       . atenolol (TENORMIN) 50 MG tablet Take 50 mg by mouth daily.        . ergocalciferol (VITAMIN D2) 50000 UNITS capsule Take 50,000 Units by mouth once a week.      . fexofenadine (ALLEGRA) 180 MG tablet Take 180 mg by mouth daily.        . pantoprazole (PROTONIX) 40 MG tablet Take 1 tablet (40 mg total) by mouth as needed. Reflux symptoms  90 tablet  1  . simvastatin (ZOCOR) 40 MG tablet Take 40 mg by mouth at bedtime.          Allergies  Allergen Reactions  . Sulfonamide Derivatives     REACTION: rash and itching    Current Medications, Allergies, Past Medical History, Past Surgical History, Family History, and Social  History were reviewed in [employer] record.   Review of Systems        See HPI - all other systems neg except as noted... The patient denies anorexia, fever, weight loss, weight gain, vision loss, decreased hearing, hoarseness, chest pain, syncope, dyspnea on exertion, peripheral edema, prolonged cough, headaches, hemoptysis, abdominal pain, melena, hematochezia, severe indigestion/heartburn, hematuria, incontinence, muscle weakness, suspicious skin lesions, transient blindness, difficulty walking, depression, unusual weight change, abnormal bleeding, enlarged lymph nodes, and  angioedema.     Objective:   Physical Exam    WD, WN, 51 y/o BF in NAD... GENERAL:  Alert & oriented; pleasant & cooperative... HEENT:  [place]/AT, EOM-wnl, PERRLA, EACs-clear, TMs-wnl, NOSE-clear, THROAT-clear & wnl. NECK:  Supple w/ full ROM; no JVD; normal carotid impulses w/o bruits; no thyromegaly or nodules palpated; no lymphadenopathy. CHEST:  Clear to P & A; no wheezes, rales, or rhonchi heard... HEART:  Regular Rhythm; without murmurs/ rubs/ or gallops detected. ABDOMEN:  Soft & nontender; normal bowel sounds; no organomegaly or masses palpated. EXT: without deformities or arthritic changes; no varicose veins/ venous insuffic/ or edema. NEURO:  CN's intact; no focal neuro deficits... DERM:  No lesions noted; no rash etc...  RADIOLOGY DATA:  Reviewed in the EPIC EMR & discussed w/ the patient...  LABORATORY DATA:  Reviewed in the EPIC EMR & discussed w/ the patient...   Assessment:     CPX>>  AR/ Hx bronchitis>  Treated 5/13 w/ ZPak & resolved; uses Allegra & Nasal steroid prn...  HBP>  Controlled on Aten50, continue same, low sodium, etc...  Hx MVP>  Followed by Howell Rucks, she has palpit, some atypCP, knows to avoid caffeine etc...  Chol>  On Simva40 but FLP followed by [facility]...  GI> GERD on Protonix; s/p colonoscopy by DrGessner 4/12 w/ polyp removed...  Anxiety>  On Xanax  for prn use...     Plan:     Patient's Medications  New Prescriptions   No medications on file  Previous Medications   ATENOLOL (TENORMIN) 50 MG TABLET    Take 50 mg by mouth daily.     ERGOCALCIFEROL (VITAMIN D2) 50000 UNITS CAPSULE    Take 50,000 Units by mouth once a week.   FEXOFENADINE (ALLEGRA) 180 MG TABLET    Take 180 mg by mouth daily.     PANTOPRAZOLE (PROTONIX) 40 MG TABLET    Take 1 tablet (40 mg total) by mouth as needed. Reflux symptoms   SIMVASTATIN (ZOCOR) 40 MG TABLET    Take 40 mg by mouth at bedtime.    Modified Medications   Modified Medication Previous Medication   ALPRAZOLAM (XANAX) 0.5 MG TABLET ALPRAZolam (XANAX) 0.5 MG tablet      1/2 -1 tab by mouth three times daily as needed for nerves    1/2 -1 tab by mouth three times daily as needed for nerves   Discontinued Medications   No medications on file

## 2011-12-11 ENCOUNTER — Telehealth: Payer: Self-pay | Admitting: Pulmonary Disease

## 2011-12-11 NOTE — Telephone Encounter (Signed)
Called and spoke with pt about her cxr results.  Pt voiced her understanding of these results and nothing further is needed. 

## 2012-04-23 ENCOUNTER — Telehealth: Payer: Self-pay | Admitting: Pulmonary Disease

## 2012-04-23 MED ORDER — AZITHROMYCIN 250 MG PO TABS: ORAL_TABLET | ORAL | Status: DC

## 2012-04-23 NOTE — Telephone Encounter (Signed)
zpak and mucinex dm

## 2012-04-23 NOTE — Telephone Encounter (Signed)
Patient aware of recs as listed below per Dr. Sherene Sires. Rx sent in and nothing further needed at this time.

## 2012-04-23 NOTE — Telephone Encounter (Signed)
Spoke with patient, c/o "bad" cough, blowing out think yellow mucus from her nose, pnd, and some chills and aches.  Patient denies any fever.  Patient states she did not want to come into office, just wants something called into pharmacy.  Patient states she has only taken Advil.  Pt aware Dr. Kriste Basque is out of office and Dr. Sherene Sires will address.  Dr. Sherene Sires, please advise, thank you  [facility]  Last OV: 12/10/11  Allergies  Allergen Reactions  . Sulfonamide Derivatives     REACTION: rash and itching

## 2012-10-18 ENCOUNTER — Other Ambulatory Visit: Payer: Self-pay | Admitting: Cardiovascular Disease

## 2012-10-19 NOTE — Telephone Encounter (Signed)
Rx was sent to pharmacy electronically. 

## 2012-12-24 ENCOUNTER — Telehealth: Payer: Self-pay | Admitting: Certified Nurse Midwife

## 2012-12-24 NOTE — Telephone Encounter (Signed)
Spoke with pt to advise she would need an OV to discuss hot flashes with DL. Med options and risks and benefits for HRT would be discussed. Pt agreeable. Sched consult 12-29-12 at 1:15 with DL.

## 2012-12-24 NOTE — Telephone Encounter (Signed)
Patient is having severe hot flashes and requests an RX to help manage please.  [phone]

## 2012-12-28 ENCOUNTER — Encounter: Payer: Self-pay | Admitting: Certified Nurse Midwife

## 2012-12-29 ENCOUNTER — Encounter: Payer: Self-pay | Admitting: Certified Nurse Midwife

## 2012-12-29 ENCOUNTER — Ambulatory Visit (INDEPENDENT_AMBULATORY_CARE_PROVIDER_SITE_OTHER): Payer: BC Managed Care – PPO | Admitting: Certified Nurse Midwife

## 2012-12-29 VITALS — BP 120/64 | HR 64 | Resp 16 | Ht 63.0 in

## 2012-12-29 DIAGNOSIS — F329 Major depressive disorder, single episode, unspecified: Secondary | ICD-10-CM

## 2012-12-29 DIAGNOSIS — F411 Generalized anxiety disorder: Secondary | ICD-10-CM

## 2012-12-29 MED ORDER — ESCITALOPRAM OXALATE 10 MG PO TABS: 10.0000 mg | ORAL_TABLET | Freq: Every day | ORAL | Status: DC

## 2012-12-29 NOTE — Progress Notes (Signed)
52 y.o.MarriedAfrican Americanfemale presents with symptoms of anxiety, depression, hot flashes, insomnia.  Pt reports symptoms have been for 2 of years.  The symptoms have moderate affected her activities of daily living.  She reports restless, nightime awakenings.  She also reports Moderate anxiety, but denies panic attacks or thoughts of self harm or others.  The patient feelings of depression of and on. I have abandoned all my friends but have no problems when I am working..  The patient states 03/28/2010. The patient  is sexually active and is usingno method, menopausal. for contraception.  ROS: Pertinent items are noted in HPI.  Physical exam:  General appearance: alert, cooperative and appears stated age  Assessment: Anxiety/Depression Menopausal  Plan:  Discussed with patient the pros and cons of  Hormone replacement to relieve symptoms.Discussed the finding of the WHI and Nurses study.  Discussed ACOG's statement regarding the use of hormones.  Patient will not use HRT, due to history of heart changes. Discussed risks/benefits/ expectations with medication use for anxiety and depression. Patient would like trial and also will return to counseling. Questions addressed at length. Stressed family and spouse support. Rx Lexapro see order.  Rv 2 week to assess status.   34 minutes spent with patient with >50% of time spent in face to face counseling.

## 2013-01-03 NOTE — Progress Notes (Signed)
Note reviewed, agree with plan.  Mikyla Schachter, MD  

## 2013-01-17 ENCOUNTER — Encounter: Payer: Self-pay | Admitting: Certified Nurse Midwife

## 2013-01-17 ENCOUNTER — Ambulatory Visit (INDEPENDENT_AMBULATORY_CARE_PROVIDER_SITE_OTHER): Payer: BC Managed Care – PPO | Admitting: Certified Nurse Midwife

## 2013-01-17 VITALS — BP 110/70 | HR 60 | Resp 16 | Ht 63.0 in | Wt 159.0 lb

## 2013-01-17 DIAGNOSIS — F329 Major depressive disorder, single episode, unspecified: Secondary | ICD-10-CM

## 2013-01-17 DIAGNOSIS — F411 Generalized anxiety disorder: Secondary | ICD-10-CM

## 2013-01-17 DIAGNOSIS — N951 Menopausal and female climacteric states: Secondary | ICD-10-CM

## 2013-01-17 MED ORDER — ESCITALOPRAM OXALATE 10 MG PO TABS: 10.0000 mg | ORAL_TABLET | Freq: Every day | ORAL | Status: DC

## 2013-01-17 NOTE — Progress Notes (Signed)
52 y.o.Married [place] American female [phone] here for follow-up of anxiety disorder and depression being treated with  Lexapro 10 mg,.initiated September3,2014.  Patient taking medication as instructed AM.  Denies nausea, headache or other medication side effects. Reports no crying, no panic attacks, no insomnia, still some fatigue but is sleeping better, with some wakeful times, not nightly as before. No thoughts of self harm or others. Hot flashes are now 2/day instead of 2 every hour,which really increased the anxiety.  Feelings of anxiety and depression "are less". Has not seen counselor, has tried before and "can just not talk about myself to someone". Plans to start regular exercise to help with overall well being. Patient denies problems at work, but not open with friends at this point yet. "I am glad I started the medication:          O:Healthy WD,WN female, appropriately dressed   Affect : Appropriate and well dressed    A:1-Anxiety/ depression responding to Lexapro 2-Menopausal with decrease in menopausal symptoms  P:1- Continue medication as prescribed, encouraged daily multivitamin, and start on regular exercise.  Encouraged to consider using a journal to express feelings she can't discuss.  Patient will consider. 2-RX Lexapro 10 mg see order 3-RV one month 4-Instructed if thoughts of self harm or others seek immediate help 911 or emergency room.  Questions addressed.     Rv 1 month, prn     38 minutes spent with patient with >50% of time spent in face to face counseling.

## 2013-01-20 NOTE — Progress Notes (Signed)
Note reviewed, agree with plan.  Nysa Sarin, MD  

## 2013-02-14 ENCOUNTER — Ambulatory Visit (INDEPENDENT_AMBULATORY_CARE_PROVIDER_SITE_OTHER): Payer: BC Managed Care – PPO | Admitting: Certified Nurse Midwife

## 2013-02-14 VITALS — BP 120/70 | HR 72 | Resp 16 | Ht 63.0 in | Wt 159.0 lb

## 2013-02-14 DIAGNOSIS — F411 Generalized anxiety disorder: Secondary | ICD-10-CM

## 2013-02-14 MED ORDER — ESCITALOPRAM OXALATE 10 MG PO TABS: 10.0000 mg | ORAL_TABLET | Freq: Every day | ORAL | Status: DC

## 2013-02-14 NOTE — Progress Notes (Signed)
52 y.o.Married [place] American female [phone] here for follow-up of anxiety disorder and depression being treated with  Lexapro 10 mg  Initiated December 29, 2012. Patient taking medication as instructed AM.  Denies nausea, headache or other medication side effects no. Reports no crying,  panic attacks,insomnia,fatigue, thoughts of self harm or others . Feelings of anxiety seem better. Patient had a "bad 3-4 days" with hot flashes and then subsided. Sleeping well, no issues. "Feel calmer and more at peace with friends and family", no issues with work. Experiencing hip issues with walking and feels this contributes to some of the stress now. Plans to make appointment for evaluation.       O:Healthy WD,WN female, appropriately dressed     Affect : Appropriate and orientation x 3    A:1-Anxiety responding to Lexapro 2-Hip issues decreasing exercise, which helps with anxiety  P:1- Continue medication as prescribed Rx Lexpro 10 mg with 2 refills see order Instructed if thoughts of self harm or others seek immediate help 911 or emergency room. Discussed importance of other activity to help with stress, suggested trying beginner Yoga. Patient will consider Questions addressed.        2-Proceed with hip evaluation.   Rv. 2 months, prn      22 minutes spent with patient with >50% of time spent in face to face counseling.

## 2013-02-16 NOTE — Progress Notes (Signed)
Note reviewed, agree with plan.  Stefanee Mckell, MD  

## 2013-03-03 ENCOUNTER — Other Ambulatory Visit: Payer: Self-pay

## 2013-04-04 ENCOUNTER — Other Ambulatory Visit: Payer: Self-pay | Admitting: Adult Health

## 2013-04-06 ENCOUNTER — Telehealth: Payer: Self-pay | Admitting: Adult Health

## 2013-04-06 MED ORDER — PANTOPRAZOLE SODIUM 40 MG PO TBEC: 40.0000 mg | DELAYED_RELEASE_TABLET | Freq: Every day | ORAL | Status: DC

## 2013-04-06 NOTE — Telephone Encounter (Signed)
RX has been sent. Nothing further needed 

## 2013-04-18 ENCOUNTER — Ambulatory Visit: Payer: BC Managed Care – PPO | Admitting: Certified Nurse Midwife

## 2013-04-18 ENCOUNTER — Telehealth: Payer: Self-pay | Admitting: Certified Nurse Midwife

## 2013-04-18 NOTE — Telephone Encounter (Signed)
Patient canceled her 2 mo reck appointment due to illness. Patient rescheduled to 04/26/13 @ 12:45 with DL.

## 2013-04-26 ENCOUNTER — Encounter: Payer: Self-pay | Admitting: Certified Nurse Midwife

## 2013-04-26 ENCOUNTER — Ambulatory Visit (INDEPENDENT_AMBULATORY_CARE_PROVIDER_SITE_OTHER): Payer: BC Managed Care – PPO | Admitting: Certified Nurse Midwife

## 2013-04-26 VITALS — BP 120/64 | HR 68 | Resp 16 | Ht 63.0 in | Wt 160.0 lb

## 2013-04-26 DIAGNOSIS — F411 Generalized anxiety disorder: Secondary | ICD-10-CM

## 2013-04-26 MED ORDER — ESCITALOPRAM OXALATE 10 MG PO TABS: 10.0000 mg | ORAL_TABLET | Freq: Every day | ORAL | Status: DC

## 2013-04-26 NOTE — Progress Notes (Signed)
Reviewed personally.  M. Suzanne Kalai Baca, MD.  

## 2013-04-26 NOTE — Progress Notes (Signed)
52 y.o.MarriedAfrican [id number] for follow-up of anxiety/depression being treated with  Lexapro 10 mg.   Initiated 01/08/13.  Patient taking medication as instructed PM.  Denies nausea, headache or other medication side effects.  Reports no crying,panic attacks, insomnia or thoughts of self harm or others. Feelings of anxiety and depression "greatly improved". Patient sleeping great, but feeling too drowsy at the end of the day. Denies any hot flashes or night sweats now! Christmas was wonderful,without any anxious times with family and friends. Patient still has not had her hip evaluated that was causing some pain at last visit in 10/14. Plans to have evaluated soon. Patient happy with progress she has made and would like to continue medication.     O:Healthy WD,WN female, appropriately dressed    Weight: 159 no weight change Affect : Appropriate and orientation x 3. Smiling.    A:1-Anxiety/depression responding to Lexapro 2-Drowsiness with medication taken in pm  P:1- Continue medication as prescribed, but start taking in am upon arising with food. Advise if no change after one week. Patient agreeable. 2-RX Lexapro 10 mg refills updated see order 3-RV  aex 2/15 4-Instructed if thoughts of self harm or others seek immediate help 911 or emergency room.  Questions addressed.     38 minutes spent with patient with >50% of time spent in face to face counseling.

## 2013-04-26 NOTE — Patient Instructions (Signed)

## 2013-04-28 DIAGNOSIS — Z8619 Personal history of other infectious and parasitic diseases: Secondary | ICD-10-CM

## 2013-04-28 HISTORY — DX: Personal history of other infectious and parasitic diseases: Z86.19

## 2013-05-11 ENCOUNTER — Encounter: Payer: Self-pay | Admitting: Certified Nurse Midwife

## 2013-05-28 ENCOUNTER — Other Ambulatory Visit: Payer: Self-pay | Admitting: Pulmonary Disease

## 2013-06-07 ENCOUNTER — Telehealth: Payer: Self-pay | Admitting: Certified Nurse Midwife

## 2013-06-07 NOTE — Telephone Encounter (Signed)
[place], [place] for Vit D to be checked?

## 2013-06-07 NOTE — Telephone Encounter (Signed)
Pt would like an order to have her vit d checked.

## 2013-06-07 NOTE — Telephone Encounter (Signed)
When is she due for aex, can do then if soon.

## 2013-06-08 NOTE — Telephone Encounter (Signed)
Will order when she comes in

## 2013-06-08 NOTE — Telephone Encounter (Signed)
Next Aex appt 06-22-13 at 10:30.  LM on pt's VM that she will have a Vit D level checked at her upcoming Aex appt. Pt to call back with any questions.  Debbie, can you enter order for Vit D?

## 2013-06-21 ENCOUNTER — Ambulatory Visit: Payer: BC Managed Care – PPO | Admitting: Certified Nurse Midwife

## 2013-06-22 ENCOUNTER — Ambulatory Visit (INDEPENDENT_AMBULATORY_CARE_PROVIDER_SITE_OTHER): Payer: BC Managed Care – PPO | Admitting: Certified Nurse Midwife

## 2013-06-22 ENCOUNTER — Encounter: Payer: Self-pay | Admitting: Certified Nurse Midwife

## 2013-06-22 VITALS — BP 114/70 | HR 74 | Resp 20 | Ht 63.0 in | Wt 160.0 lb

## 2013-06-22 DIAGNOSIS — Z0000 Encounter for general adult medical examination without abnormal findings: Secondary | ICD-10-CM

## 2013-06-22 DIAGNOSIS — F411 Generalized anxiety disorder: Secondary | ICD-10-CM

## 2013-06-22 DIAGNOSIS — Z01419 Encounter for gynecological examination (general) (routine) without abnormal findings: Secondary | ICD-10-CM

## 2013-06-22 LAB — HEMOGLOBIN, FINGERSTICK: HEMOGLOBIN, FINGERSTICK: 13.5 g/dL (ref 12.0–16.0)

## 2013-06-22 LAB — POCT URINALYSIS DIPSTICK
Bilirubin, UA: NEGATIVE
Blood, UA: NEGATIVE
Glucose, UA: NEGATIVE
KETONES UA: NEGATIVE
LEUKOCYTES UA: NEGATIVE
Nitrite, UA: NEGATIVE
PROTEIN UA: NEGATIVE
UROBILINOGEN UA: NEGATIVE
pH, UA: 5

## 2013-06-22 LAB — TSH: TSH: 1.947 u[IU]/mL (ref 0.350–4.500)

## 2013-06-22 MED ORDER — ESCITALOPRAM OXALATE 10 MG PO TABS: 10.0000 mg | ORAL_TABLET | Freq: Every day | ORAL | Status: DC

## 2013-06-22 NOTE — Progress Notes (Signed)
53 y.o. [id number] Married African American Fe here for annual exam. Menopausal no vaginal bleeding or vaginal dryness. Lexapro working well for anxiety, will need refill. Drowsy times have totally resolved. No social issues now with anxiety. Patient does have periods of fatigue, but works 8-9 hours daily and cares for family. She has her 6 month check up with cardiology soon, will discuss also then. No other health issues. Sees PCP prn. Cardiology does all labs.    Patient's last menstrual period was 03/28/2010.          Sexually active: no  The current method of family planning is abstinence and post menopausal status.    Exercising: no  exercise Smoker:  no  Health Maintenance: Pap:  06-17-12 neg HPV HR neg MMG:  10/06/12 normal Colonoscopy:  2012 BMD:   none TDaP:  05-22-11 Labs: Poct urine-neg, Hgb-13.5 Self breast exam:done monthly   reports that she has never smoked. She does not have any smokeless tobacco history on file. She reports that she does not drink alcohol or use illicit drugs.  Past Medical History  Diagnosis Date  . Allergy   . GERD (gastroesophageal reflux disease)   . MVP (mitral valve prolapse)   . Hyperlipidemia     per pt NO  . CAD (coronary artery disease)   . Allergic rhinitis   . HTN (hypertension)   . Lumbar back pain   . Anxiety   . Migraines   . Abnormal Pap smear   . Anemia   . History of shingles 1/15    Past Surgical History  Procedure Laterality Date  . Cesarean section      twice  . Wrist surgery      right  . Hysteroscopy    . Cryoablation      times 2 for abn paps    Current Outpatient Prescriptions  Medication Sig Dispense Refill  . atenolol (TENORMIN) 50 MG tablet Take 50 mg by mouth daily.        . ergocalciferol (VITAMIN D2) 50000 UNITS capsule Take 50,000 Units by mouth once a week.      . escitalopram (LEXAPRO) 10 MG tablet Take 1 tablet (10 mg total) by mouth daily.  30 tablet  6  . fexofenadine (ALLEGRA) 180 MG tablet Take  180 mg by mouth daily.        . pantoprazole (PROTONIX) 40 MG tablet Take 40 mg by mouth as needed. Reflux symptoms      . simvastatin (ZOCOR) 40 MG tablet TAKE 1 TABLET BY MOUTH EVERY DAY  90 tablet  3   No current facility-administered medications for this visit.    Family History  Problem Relation Age of Onset  . Breast cancer Mother   . Heart disease Mother   . Diabetes Mother   . Hypertension Mother   . Prostate cancer Father   . Diabetes Sister   . Hypertension Sister   . Heart disease Brother   . Other Brother     CHF, had heart transplant    ROS:  Pertinent items are noted in HPI.  Otherwise, a comprehensive ROS was negative.  Exam:   BP 114/70  Pulse 74  Resp 20  Ht 5\' 3"  (1.6 m)  Wt 160 lb (72.576 kg)  BMI 28.35 kg/m2  LMP 03/28/2010 Height: 5\' 3"  (160 cm)  Ht Readings from Last 3 Encounters:  06/22/13 5\' 3"  (1.6 m)  04/26/13 5\' 3"  (1.6 m)  02/14/13 5\' 3"  (1.6 m)  General appearance: alert, cooperative and appears stated age Head: Normocephalic, without obvious abnormality, atraumatic Neck: no adenopathy, supple, symmetrical, trachea midline and thyroid normal to inspection and palpation Lungs: clear to auscultation bilaterally Breasts: normal appearance, no masses or tenderness, No nipple retraction or dimpling, No nipple discharge or bleeding, No axillary or supraclavicular adenopathy Heart: regular rate and rhythm Abdomen: soft, non-tender; no masses,  no organomegaly Extremities: extremities normal, atraumatic, no cyanosis or edema Skin: Skin color, texture, turgor normal. No rashes or lesions Lymph nodes: Cervical, supraclavicular, and axillary nodes normal. No abnormal inguinal nodes palpated Neurologic: Grossly normal   Pelvic: External genitalia:  no lesions              Urethra:  normal appearing urethra with no masses, tenderness or lesions              Bartholin's and Skene's: normal                 Vagina: normal appearing vagina with  normal color and discharge, no lesions              Cervix: normal, non tender, slight adherence to posterior vaginal wall              Pap taken: no Bimanual Exam:  Uterus:  normal size, contour, position, consistency, mobility, non-tender and anteflexed              Adnexa: normal adnexa and no mass, fullness, tenderness               Rectovaginal: Confirms               Anus:  normal sphincter tone, no lesions  A:  Well Woman with normal exam  Menopausal no HRT  Anxiety Lexpro working well  History of abnormal pap with cryo age 71, etiology unknown  History of MVP with cardiology management  Fatigue ?    P:   Reviewed health and wellness pertinent to exam  Aware of need to advise if vaginal bleeding  Patient and provider discussed any more frequent visit with anxiety and feel together she is doing well and will call in at 6 months with status and visit prn .  Rx Lexparo see order  Continue follow up as indicated  Discussed fatigue and feel some is related to time of year and activity. Will check labs and patient to discuss also with cardiology  UXN:ATFTDDU D, TSH  Pap smear as per guidelines   Mammogram yearly pap smear not taken today counseled on breast self exam, mammography screening, menopause, adequate intake of calcium and vitamin D, diet and exercise, Colonoscopy due, patient has scheduled due to polyps. return annually or prn  An After Visit Summary was printed and given to the patient.

## 2013-06-22 NOTE — Patient Instructions (Signed)

## 2013-06-23 LAB — VITAMIN D 25 HYDROXY (VIT D DEFICIENCY, FRACTURES): Vit D, 25-Hydroxy: 53 ng/mL (ref 30–89)

## 2013-06-23 NOTE — Progress Notes (Signed)
Reviewed personally.  M. Suzanne Jelicia Nantz, MD.  

## 2013-07-05 ENCOUNTER — Telehealth: Payer: Self-pay | Admitting: Cardiovascular Disease

## 2013-07-05 DIAGNOSIS — R5381 Other malaise: Secondary | ICD-10-CM

## 2013-07-05 DIAGNOSIS — R5383 Other fatigue: Secondary | ICD-10-CM

## 2013-07-05 DIAGNOSIS — E782 Mixed hyperlipidemia: Secondary | ICD-10-CM

## 2013-07-05 DIAGNOSIS — Z79899 Other long term (current) drug therapy: Secondary | ICD-10-CM

## 2013-07-05 NOTE — Telephone Encounter (Signed)
Lab(s) ordered and Lab slip mailed.  

## 2013-07-05 NOTE — Telephone Encounter (Signed)
Please send her a lab order.She usually have her lab work before she see Dr [name],

## 2013-07-12 ENCOUNTER — Encounter: Payer: Self-pay | Admitting: Internal Medicine

## 2013-07-12 ENCOUNTER — Other Ambulatory Visit: Payer: Self-pay | Admitting: *Deleted

## 2013-07-12 MED ORDER — ATENOLOL 50 MG PO TABS: 50.0000 mg | ORAL_TABLET | Freq: Every day | ORAL | Status: DC

## 2013-07-13 ENCOUNTER — Other Ambulatory Visit: Payer: Self-pay

## 2013-07-13 MED ORDER — ATENOLOL 50 MG PO TABS: 50.0000 mg | ORAL_TABLET | Freq: Every day | ORAL | Status: DC

## 2013-07-13 NOTE — Telephone Encounter (Signed)
Rx was sent to pharmacy electronically. 

## 2013-07-26 ENCOUNTER — Other Ambulatory Visit: Payer: Self-pay | Admitting: *Deleted

## 2013-08-09 ENCOUNTER — Encounter: Payer: Self-pay | Admitting: *Deleted

## 2013-08-10 LAB — TSH: TSH: 2.717 u[IU]/mL (ref 0.350–4.500)

## 2013-08-10 LAB — COMPREHENSIVE METABOLIC PANEL
ALBUMIN: 4.5 g/dL (ref 3.5–5.2)
ALK PHOS: 86 U/L (ref 39–117)
ALT: 13 U/L (ref 0–35)
AST: 22 U/L (ref 0–37)
BUN: 5 mg/dL — AB (ref 6–23)
CO2: 27 mEq/L (ref 19–32)
Calcium: 9.8 mg/dL (ref 8.4–10.5)
Chloride: 106 mEq/L (ref 96–112)
Creat: 0.72 mg/dL (ref 0.50–1.10)
Glucose, Bld: 89 mg/dL (ref 70–99)
POTASSIUM: 4.7 meq/L (ref 3.5–5.3)
Sodium: 142 mEq/L (ref 135–145)
Total Bilirubin: 0.6 mg/dL (ref 0.2–1.2)
Total Protein: 7.2 g/dL (ref 6.0–8.3)

## 2013-08-10 LAB — CBC
HEMATOCRIT: 40.3 % (ref 36.0–46.0)
Hemoglobin: 13.4 g/dL (ref 12.0–15.0)
MCH: 29.1 pg (ref 26.0–34.0)
MCHC: 33.3 g/dL (ref 30.0–36.0)
MCV: 87.6 fL (ref 78.0–100.0)
PLATELETS: 188 10*3/uL (ref 150–400)
RBC: 4.6 MIL/uL (ref 3.87–5.11)
RDW: 14.3 % (ref 11.5–15.5)
WBC: 4.1 10*3/uL (ref 4.0–10.5)

## 2013-08-10 LAB — LIPID PANEL
Cholesterol: 143 mg/dL (ref 0–200)
HDL: 76 mg/dL (ref >39)
LDL CALC: 59 mg/dL (ref 0–99)
Total CHOL/HDL Ratio: 1.9 Ratio
Triglycerides: 40 mg/dL (ref <150)
VLDL: 8 mg/dL (ref 0–40)

## 2013-08-15 ENCOUNTER — Encounter: Payer: Self-pay | Admitting: Cardiovascular Disease

## 2013-08-15 ENCOUNTER — Ambulatory Visit (INDEPENDENT_AMBULATORY_CARE_PROVIDER_SITE_OTHER): Payer: BC Managed Care – PPO | Admitting: Cardiovascular Disease

## 2013-08-15 VITALS — BP 140/82 | HR 72 | Ht 63.0 in | Wt 162.0 lb

## 2013-08-15 DIAGNOSIS — E7800 Pure hypercholesterolemia, unspecified: Secondary | ICD-10-CM

## 2013-08-15 DIAGNOSIS — I10 Essential (primary) hypertension: Secondary | ICD-10-CM

## 2013-08-15 DIAGNOSIS — K219 Gastro-esophageal reflux disease without esophagitis: Secondary | ICD-10-CM

## 2013-08-15 DIAGNOSIS — I491 Atrial premature depolarization: Secondary | ICD-10-CM | POA: Insufficient documentation

## 2013-08-15 DIAGNOSIS — I059 Rheumatic mitral valve disease, unspecified: Secondary | ICD-10-CM

## 2013-08-15 NOTE — Progress Notes (Signed)
Patient ID: Grace George, female   DOB: Aug 11, 1960, 53 y.o.   MRN: [id number]     HPI: Grace George is a 53 y.o. female who presents to the office for one year cardiology evaluation.  Grace George is a 53 year old African American female, who has a history of hypertension, hyperlipidemia, as well as palpitations for which he has been treated with beta blocker therapy.  An echo Doppler study in 2009, showed normal systolic and diastolic function and she did have borderline mitral valve prolapse.  Over the past year, she feels that she has done well.  She has been on atenolol 50 mg daily for her palpitations.  She also has been on simvastatin 40 mg for hyperlipidemia.  She recently had laboratory on 08/10/2013.  Hemoglobin and hematocrit were normal t 0.4 and 40.3, respectively.  Electrolytes were normal.  2 normal liver function studies.  Lipid parameters were excellent with a total cholesterol 143, triglycerides 40, HDL 76, and LDL cholesterol 59.  She had normal thyroid function.  She denies any overdose of recurrent palpitations.  She does not routinely exercise.  She presents for one-year evaluation  Past Medical History  Diagnosis Date  . Allergy   . GERD (gastroesophageal reflux disease)   . MVP (mitral valve prolapse)   . Hyperlipidemia     per pt NO  . CAD (coronary artery disease)   . Allergic rhinitis   . HTN (hypertension)   . Lumbar back pain   . Anxiety   . Migraines   . Abnormal Pap smear   . Anemia   . History of shingles 1/15  . Hypertension   . Hx of echocardiogram 2009    Showed normal systolic and diastolic function. she does have borderline mitral valve prolapse.  [name] History of stress test 06/2011    Abnormal Myocadial perfusion scan demomstrating an attenuation defect in the anterior region of the myocardium, No ischemia or infaract/scar is seenin the remaining myocardium. Excercise capacity 12 METS.    Past Surgical History  Procedure Laterality Date  .  Cesarean section      twice  . Wrist surgery      right  . Hysteroscopy    . Cryoablation      times 2 for abn paps    Allergies  Allergen Reactions  . Sulfa Antibiotics     Rash & itching    Current Outpatient Prescriptions  Medication Sig Dispense Refill  . atenolol (TENORMIN) 50 MG tablet Take 1 tablet (50 mg total) by mouth daily.  30 tablet  0  . ergocalciferol (VITAMIN D2) 50000 UNITS capsule Take 50,000 Units by mouth once a week.      . escitalopram (LEXAPRO) 10 MG tablet Take 1 tablet (10 mg total) by mouth daily.  30 tablet  6  . fexofenadine (ALLEGRA) 180 MG tablet Take 180 mg by mouth daily.        . pantoprazole (PROTONIX) 40 MG tablet Take 40 mg by mouth as needed. Reflux symptoms      . simvastatin (ZOCOR) 40 MG tablet TAKE 1 TABLET BY MOUTH EVERY DAY  90 tablet  3   No current facility-administered medications for this visit.    History   Social History  . Marital Status: Married    Spouse Name: N/A    Number of Children: 2  . Years of Education: N/A   Occupational History  . mortgage loan specialist    Social History Main Topics  .  Smoking status: Never Smoker   . Smokeless tobacco: Not on file  . Alcohol Use: No  . Drug Use: No  . Sexual Activity: Not Currently    Partners: Male    Birth Control/ Protection: Abstinence   Other Topics Concern  . Not on file   Social History Narrative  . No narrative on file   Socially, she is married and has 2 children, currently age 74 and 59.  She does not yet have grandchildren.  There is no tobacco or alcohol use.  Of note, her brother underwent  cardiac transplantation .  A year and a half ago and is doing well.  Family History  Problem Relation Age of Onset  . Breast cancer Mother   . Heart disease Mother   . Diabetes Mother   . Hypertension Mother   . Prostate cancer Father   . Diabetes Sister   . Hypertension Sister   . Heart disease Brother   . Other Brother     CHF, had heart transplant     ROS is negative for fevers, chills or night sweats.  She denies change in weight.  She denies change in vision or hearing.  There is no lymphadenopathy.  She's unaware of wheezing.  She denies shortness of breath.  She denies presyncope.  She is unaware of palpitations.  There is no chest pressure.  She does have mild GERD, for which she takes Protonix on an as-needed basis. She denies nausea, vomiting, or diarrhea.  There is no claudication.  She denies myalgias.  She denies edema.  There is no diabetes.  There is no hypothyroidism.  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  She denies issues with sleep Other comprehensive 14 point system review is negative.  PE BP 140/82  Pulse 72  Ht 5\' 3"  (1.6 m)  Wt 162 lb (73.483 kg)  BMI 28.70 kg/m2  LMP 03/28/2010  General: Alert, oriented, no distress.  Skin: normal turgor, no rashes HEENT: Normocephalic, atraumatic. Pupils round and reactive; sclera anicteric;no lid lag. Extraocular muscles intact;; no xanthelasmas. Nose without nasal septal hypertrophy Mouth/Parynx benign; Mallinpatti scale 2 Neck: No JVD, no carotid bruits; normal carotid upstroke Lungs: clear to ausculatation and percussion; no wheezing or rales Chest wall: no tenderness to palpitation Heart: RRR, s1 s2 normal; faint systolic murmur; no diastolic murmur, rub thrills or heaves Abdomen: soft, nontender; no hepatosplenomehaly, BS+; abdominal aorta nontender and not dilated by palpation. Back: no CVA tenderness Pulses 2+ Extremities: no clubbing cyanosis or edema, Homan's sign negative  Neurologic: grossly nonfocal; cranial nerves grossly normal. Psychologic: normal affect and mood.  ECG (independently read by me): Sinus rhythm at 72 beats per minute with mild RV conduction delay.  She is previously noted T-wave inversion in leads V1 through V3.  She also has occasional PACs with different morphology to the P wave in these premature complexes.  QTc  interval 47 ms.  PR interval 188 ms.  LABS:  BMET    Component Value Date/Time   NA 142 08/10/2013 0809   K 4.7 08/10/2013 0809   CL 106 08/10/2013 0809   CO2 27 08/10/2013 0809   GLUCOSE 89 08/10/2013 0809   BUN 5* 08/10/2013 0809   CREATININE 0.72 08/10/2013 0809   CALCIUM 9.8 08/10/2013 0809     Hepatic Function Panel     Component Value Date/Time   PROT 7.2 08/10/2013 0809   ALBUMIN 4.5 08/10/2013 0809   AST 22 08/10/2013 0809  ALT 13 08/10/2013 0809   ALKPHOS 86 08/10/2013 0809   BILITOT 0.6 08/10/2013 0809     CBC    Component Value Date/Time   WBC 4.1 08/10/2013 0809   RBC 4.60 08/10/2013 0809   HGB 13.4 08/10/2013 0809   HCT 40.3 08/10/2013 0809   PLT 188 08/10/2013 0809   MCV 87.6 08/10/2013 0809   MCH 29.1 08/10/2013 0809   MCHC 33.3 08/10/2013 0809   RDW 14.3 08/10/2013 0809     BNP No results found for this basename: probnp    Lipid Panel     Component Value Date/Time   CHOL 143 08/10/2013 0809   TRIG 40 08/10/2013 0809   HDL 76 08/10/2013 0809   CHOLHDL 1.9 08/10/2013 0809   VLDL 8 08/10/2013 0809   LDLCALC 59 08/10/2013 0809     RADIOLOGY: No results found.    ASSESSMENT AND PLAN:  Grace George  is a very pleasant 53 year old female who has a history of hypertension, hyperlipidemia, and palpitations.  Her blood pressure today is well controlled and on repeat by me was 124/78.  She does have occasional PACs noted on her ECG and has previously noted T-wave inversion in leads with her mild RV conduction abnormality.  She's asymptomatic with reference to this.  Previous echo Doppler  assessment has shown normal systolic as well as diastolic function.  I extensively reviewed her laboratory.  Lipid studies are excellent.  TSH is normal.  She will continue current therapy as prescribed.  If she does become symptomatic with palpitations she can increase her atenolol as needed and take the next 25 mg on a when necessary basis.  I have recommended she increase her  activity level.  I will see her in one year for followup evaluation.    Troy Sine, MD, [facility]  08/15/2013 9:07 AM

## 2013-08-15 NOTE — Patient Instructions (Signed)
Your physician recommends that you schedule a follow-up appointment in: 1 year  

## 2013-09-06 ENCOUNTER — Ambulatory Visit (AMBULATORY_SURGERY_CENTER): Payer: Self-pay

## 2013-09-06 VITALS — Ht 63.0 in | Wt 162.4 lb

## 2013-09-06 DIAGNOSIS — Z86010 Personal history of colonic polyps: Secondary | ICD-10-CM

## 2013-09-06 MED ORDER — NA SULFATE-K SULFATE-MG SULF 17.5-3.13-1.6 GM/177ML PO SOLN: ORAL | Status: DC

## 2013-09-06 NOTE — Progress Notes (Signed)
Per pt, eggs cause a rash if she eats a lot! Some sedations cause nausea and vomiting. She is not taking any weight loss meds and no O2 at home.

## 2013-09-12 ENCOUNTER — Other Ambulatory Visit: Payer: Self-pay | Admitting: Certified Nurse Midwife

## 2013-09-12 ENCOUNTER — Encounter: Payer: Self-pay | Admitting: Internal Medicine

## 2013-09-13 NOTE — Telephone Encounter (Signed)
Continue w/ Rx Vitamin D - Per Dr. Ammie Ferrier Lab note 06/22/13 Rx sent to pharmacy.

## 2013-09-20 ENCOUNTER — Encounter: Payer: Self-pay | Admitting: Internal Medicine

## 2013-09-20 ENCOUNTER — Ambulatory Visit (AMBULATORY_SURGERY_CENTER): Payer: BC Managed Care – PPO | Admitting: Internal Medicine

## 2013-09-20 VITALS — BP 143/74 | HR 58 | Temp 96.3°F | Resp 13 | Ht 63.0 in | Wt 162.0 lb

## 2013-09-20 DIAGNOSIS — Z86010 Personal history of colonic polyps: Secondary | ICD-10-CM

## 2013-09-20 MED ORDER — SODIUM CHLORIDE 0.9 % IV SOLN: 500.0000 mL | INTRAVENOUS | Status: DC

## 2013-09-20 NOTE — Op Note (Signed)
[facility]  [address] [place], [address]   COLONOSCOPY PROCEDURE REPORT  PATIENT: Grace, George  MR#: [id number] BIRTHDATE: [birth year] , 53  yrs. old GENDER: Female ENDOSCOPIST: Gatha Mayer, MD, [facility] PROCEDURE DATE:  09/20/2013 PROCEDURE:   Colonoscopy, surveillance First Screening Colonoscopy - Avg.  risk and is 50 yrs.  old or older - No.  Prior Negative Screening - Now for repeat screening. N/A  History of Adenoma - Now for follow-up colonoscopy & has been > or = to 3 yrs.  Yes hx of adenoma.  Has been 3 or more years since last colonoscopy.  Polyps Removed Today? No.  Recommend repeat exam, <10 yrs? Yes.  High risk (family or personal hx). ASA CLASS:   Class II INDICATIONS:Patient's personal history of adenomatous colon polyps and Last colonoscopy performed 3 years ago. MEDICATIONS: propofol (Diprivan) 200mg  IV, MAC sedation, administered by CRNA, These medications were titrated to patient response per physician's verbal order, and Benadryl 50 mg IV  DESCRIPTION OF PROCEDURE:   After the risks benefits and alternatives of the procedure were thoroughly explained, informed consent was obtained.  A digital rectal exam revealed no abnormalities of the rectum.   The LB [id number]  endoscope was introduced through the anus and advanced to the cecum, which was identified by both the appendix and ileocecal valve. No adverse events experienced.   The quality of the prep was excellent using Suprep  The instrument was then slowly withdrawn as the colon was fully examined.      COLON FINDINGS: A normal appearing cecum, ileocecal valve, and appendiceal orifice were identified.  The ascending, hepatic flexure, transverse, splenic flexure, descending, sigmoid colon and rectum appeared unremarkable.  No polyps or cancers were seen. Retroflexed views revealed no abnormalities. The time to cecum=5 minutes 0 seconds.  Withdrawal time=6 minutes 45 seconds.   The scope was withdrawn and the procedure completed. COMPLICATIONS: There were no complications.  ENDOSCOPIC IMPRESSION: Normal colonoscopy - excellent prep - hx TV adenoma 2012  RECOMMENDATIONS: Repeat Colonoscopy in 5 years - 2020   eSigned:  Gatha Mayer, MD, [facility] 09/20/2013 8:38 AM   cc: The Patient

## 2013-09-20 NOTE — Patient Instructions (Addendum)
No polyps today. Next routine colonoscopy in 5 years - 2020  I appreciate the opportunity to care for you. Bernal Luhman E. Jedediah Noda, MD, FACG  YOU HAD AN ENDOSCOPIC PROCEDURE TODAY AT THE [facility]: Refer to the procedure report that was given to you for any specific questions about what was found during the examination.  If the procedure report does not answer your questions, please call your gastroenterologist to clarify.  If you requested that your care partner not be given the details of your procedure findings, then the procedure report has been included in a sealed envelope for you to review at your convenience later.  YOU SHOULD EXPECT: Some feelings of bloating in the abdomen. Passage of more gas than usual.  Walking can help get rid of the air that was put into your GI tract during the procedure and reduce the bloating. If you had a lower endoscopy (such as a colonoscopy or flexible sigmoidoscopy) you may notice spotting of blood in your stool or on the toilet paper. If you underwent a bowel prep for your procedure, then you may not have a normal bowel movement for a few days.  DIET: Your first meal following the procedure should be a light meal and then it is ok to progress to your normal diet.  A half-sandwich or bowl of soup is an example of a good first meal.  Heavy or fried foods are harder to digest and may make you feel nauseous or bloated.  Likewise meals heavy in dairy and vegetables can cause extra gas to form and this can also increase the bloating.  Drink plenty of fluids but you should avoid alcoholic beverages for 24 hours.  ACTIVITY: Your care partner should take you home directly after the procedure.  You should plan to take it easy, moving slowly for the rest of the day.  You can resume normal activity the day after the procedure however you should NOT DRIVE or use heavy machinery for 24 hours (because of the sedation medicines used during the test).    SYMPTOMS TO  REPORT IMMEDIATELY: A gastroenterologist can be reached at any hour.  During normal business hours, 8:30 AM to 5:00 PM Monday through Friday, call [phone].  After hours and on weekends, please call the GI answering service at [phone] who will take a message and have the physician on call contact you.   Following lower endoscopy (colonoscopy or flexible sigmoidoscopy):  Excessive amounts of blood in the stool  Significant tenderness or worsening of abdominal pains  Swelling of the abdomen that is new, acute  Fever of 100F or higher    FOLLOW UP: If any biopsies were taken you will be contacted by phone or by letter within the next 1-3 weeks.  Call your gastroenterologist if you have not heard about the biopsies in 3 weeks.  Our staff will call the home number listed on your records the next business day following your procedure to check on you and address any questions or concerns that you may have at that time regarding the information given to you following your procedure. This is a courtesy call and so if there is no answer at the home number and we have not heard from you through the emergency physician on call, we will assume that you have returned to your regular daily activities without incident.  SIGNATURES/CONFIDENTIALITY: You and/or your care partner have signed paperwork which will be entered into your electronic medical record.  These signatures   attest to the fact that that the information above on your After Visit Summary has been reviewed and is understood.  Full responsibility of the confidentiality of this discharge information lies with you and/or your care-partner.

## 2013-09-21 ENCOUNTER — Telehealth: Payer: Self-pay | Admitting: *Deleted

## 2013-09-21 NOTE — Telephone Encounter (Signed)
  Follow up Call-  Call back number 09/20/2013  Post procedure Call Back phone  # [phone]  Permission to leave phone message Yes     Patient questions:  Do you have a fever, pain , or abdominal swelling? yes Pain Score  2 *  Have you tolerated food without any problems? yes  Have you been able to return to your normal activities? yes  Do you have any questions about your discharge instructions: Diet   no Medications  no Follow up visit  no  Do you have questions or concerns about your Care? no  Actions: * If pain score is 4 or above: No action needed, pain <4. Patient complains of gas pains, stating she did eat heavy foods yesterday. Advised to drink warm fluids, keep food intake light today and use Gas x if needed. Patient to call if worsens or any questions.

## 2013-10-03 ENCOUNTER — Encounter: Payer: Self-pay | Admitting: Cardiovascular Disease

## 2013-10-05 ENCOUNTER — Telehealth: Payer: Self-pay | Admitting: Certified Nurse Midwife

## 2013-10-05 ENCOUNTER — Telehealth: Payer: Self-pay | Admitting: Cardiovascular Disease

## 2013-10-05 DIAGNOSIS — E782 Mixed hyperlipidemia: Secondary | ICD-10-CM

## 2013-10-05 DIAGNOSIS — Z79899 Other long term (current) drug therapy: Secondary | ICD-10-CM

## 2013-10-05 NOTE — Telephone Encounter (Signed)
It can. Consider a 4-6 week "holiday" off the drug to see if symptoms resolve.

## 2013-10-05 NOTE — Telephone Encounter (Signed)
Lab order placed for lipids for 6 weeks out per Dr. [name].  Mailed to patient with instructions to fast.

## 2013-10-05 NOTE — Telephone Encounter (Signed)
I recommend cutting the Lexapro 10 mg pill in half and taking it daily for a least one month before stopping the prescription.  Please schedule an appointment for the patient to see Evalee Mutton to discuss treatment of menopausal symptoms.

## 2013-10-05 NOTE — Telephone Encounter (Signed)
Told to go on a statin "holiday" and let us know if symptoms resolve after 4-6 weeks.  Voiced understanding. She requested we let Dr. [name] know what was going on.  Message sent to Dr. [name].

## 2013-10-05 NOTE — Telephone Encounter (Signed)
Acknowledged; check lipids in 6 weeks off treatment

## 2013-10-05 NOTE — Telephone Encounter (Signed)
Spoke with patient. Patient states that she was placed on Lexapro for her hot flashes because she is unable to take medication with estrogen in it. Patient would like to stop taking Lexapro at this time. Patient states that she has gained weight since starting the medication and she is tired all the time. Patient would like to know how she should stop taking this medication as she was advised not to stop it all at once. Advised we will have patient wean off medication but would speak with provider for further details. Patient states that the Lexapro helped with the hot flashes but she does not want to take it any longer. Patient would like to try something else for hot flashes if there are any other options. Advised patient she would try Archie or Primose to help with menopause symptoms. Patient agreeable but would like to know if there are any other options as well. Advised would send a message to the provider and give patient a call back with further instructions and recommendations. Patient agreeable.  Routing to Dr.Silva as covering CC: Grace George CNM

## 2013-10-05 NOTE — Telephone Encounter (Signed)
Patient is inquiring about Zocor---can it cause hip and leg pain?

## 2013-10-05 NOTE — Telephone Encounter (Signed)
Patient wants to talk to a nurse regarding her Lexapro medication.

## 2013-10-05 NOTE — Telephone Encounter (Signed)
Spoke with patient. Advised of message from [place] as seen below. Patient agreeable and verbalizes understanding. Patient would like to try OTC menopause relief for now and will call back if she would like to come in to discuss medication further with Regina Eck CNM if symptoms are uncomfortable.  Routing to Dr. Quincy Simmonds as covering CC: Regina Eck CNM

## 2013-10-12 ENCOUNTER — Telehealth: Payer: Self-pay | Admitting: Certified Nurse Midwife

## 2013-10-12 NOTE — Telephone Encounter (Signed)
Patient is calling to let debbi know that she changed her mind and has decided to stay on the lexapro

## 2013-10-12 NOTE — Telephone Encounter (Signed)
Patient called in on 6/10 wanting to wean off of Lexapro due to fatigue and weight gain. Patient was instructed to take half of 10mg  tablet for one month and then stop medication per Dr.Silva. Patient is calling to let Regina Eck CNM that she has changed her mind and would like to stay on the Lexapro for hot flashes. Prescription was sent in for #30 with 6RF on 06/22/2013.  To Regina Eck CNM as FYI  Routing to provider for final review. Patient agreeable to disposition. Will close encounter.

## 2013-10-19 ENCOUNTER — Other Ambulatory Visit: Payer: Self-pay

## 2013-10-19 MED ORDER — ATENOLOL 50 MG PO TABS: 50.0000 mg | ORAL_TABLET | Freq: Every day | ORAL | Status: DC

## 2013-10-19 NOTE — Telephone Encounter (Signed)
Rx was sent to pharmacy electronically. 

## 2013-11-24 LAB — LIPID PANEL
CHOLESTEROL: 169 mg/dL (ref 0–200)
HDL: 72 mg/dL (ref >39)
LDL Cholesterol: 85 mg/dL (ref 0–99)
Total CHOL/HDL Ratio: 2.3 Ratio
Triglycerides: 60 mg/dL (ref <150)
VLDL: 12 mg/dL (ref 0–40)

## 2013-11-25 ENCOUNTER — Telehealth: Payer: Self-pay | Admitting: Cardiovascular Disease

## 2013-11-25 NOTE — Telephone Encounter (Signed)
Pt would like her lab results from yesterday,she needs to know what to do about her Zocor.

## 2013-11-25 NOTE — Telephone Encounter (Signed)
Spoke with pt, aware of lab results. She had stopped the simvastatin due to leg pain but it did not improve off the zocor. Okay given for pt to restart simvastatin at previous dosage.

## 2013-12-08 ENCOUNTER — Telehealth: Payer: Self-pay | Admitting: Certified Nurse Midwife

## 2013-12-08 NOTE — Telephone Encounter (Signed)
agree

## 2013-12-08 NOTE — Telephone Encounter (Signed)
  Advised patient to cut in half to 5 mg daily and take for 30 days. Then can stop taking after that. This was the instruction that was given to her prior when she wanted to stop before. Patient states that she has been gaining a lot of weight and feels it is r/t lexapro. She will try to decrease dose, but if has hot flashes that are troublesome she will call back.  Routing to provider for final review. Patient agreeable to disposition. Will close encounter

## 2013-12-08 NOTE — Telephone Encounter (Signed)
Patient wants to speak with nurse about "how to taper off of Lexapro."

## 2014-01-12 ENCOUNTER — Telehealth: Payer: Self-pay | Admitting: Certified Nurse Midwife

## 2014-01-12 ENCOUNTER — Telehealth: Payer: Self-pay | Admitting: Pulmonary Disease

## 2014-01-12 NOTE — Telephone Encounter (Signed)
Return call to patient, LMTCB. Ask for triage.

## 2014-01-12 NOTE — Telephone Encounter (Signed)
Symptoms may be due to discontinuation of the Lexapro.  But, evaluation with her PCP to rule out other sources is very reasonable.

## 2014-01-12 NOTE — Telephone Encounter (Signed)
Call to patient and notified of Dr Elza Rafter review of call. PCP unable to see her until 02-02-14 so advised if persists or worsens to seek evaluation at urgent care.  Encounter closed.

## 2014-01-12 NOTE — Telephone Encounter (Signed)
Patient calling to speak with nurse about any side effects of coming off of Lexapro that may cause the dizziness she is experiencing.

## 2014-01-12 NOTE — Telephone Encounter (Signed)
Patient returned call. States  Weaned off Lexapro. Decreased to half pill for one month and then discontinues. Has been completely off for one week.  Complains of dizziness that started two about two days after stopping Lexapro. Has persisted for last 5 days. Feels like dizziness is getting worse. It is intermittent throughout the day, unable to describe it, states "just dizzy" Dizziness is independent of activity. Position changes does not seem to make it better or worse. Denies history of BP issues. Went to drug store and checked BP at 130/68. Went off Lexapro due to weight gain. States hot flashes are much worse since off Lexapro. Does not want to restart med. Wants to know if this would be related to discontinuing medication. Advised that since dizziness getting worse the longer she is off Lexapro, symptoms more likely to be related to something else and recommend she contact PCP.  She acknowledges she has had some dietary changes due to weight concerns and has had some sinus issues.  Advised MD would review call and if has additional recommendations i will call her but recommend she proceed with calling PCP for evaluation.  Agree with call?

## 2014-01-12 NOTE — Telephone Encounter (Signed)
Called spoke with pt. Aware she does need to find new PCP. Pt has not been seen since 2013. Nothing further needed

## 2014-01-16 ENCOUNTER — Encounter: Payer: Self-pay | Admitting: Family Medicine

## 2014-01-16 ENCOUNTER — Ambulatory Visit (INDEPENDENT_AMBULATORY_CARE_PROVIDER_SITE_OTHER): Payer: BC Managed Care – PPO | Admitting: Family Medicine

## 2014-01-16 VITALS — BP 142/76 | HR 56 | Temp 98.0°F | Ht 63.0 in | Wt 171.5 lb

## 2014-01-16 DIAGNOSIS — J329 Chronic sinusitis, unspecified: Secondary | ICD-10-CM

## 2014-01-16 MED ORDER — AMOXICILLIN-POT CLAVULANATE 875-125 MG PO TABS: 1.0000 | ORAL_TABLET | Freq: Two times a day (BID) | ORAL | Status: DC

## 2014-01-16 NOTE — Patient Instructions (Signed)
-  allegra daily  -nasocort daily   -As we discussed, we have prescribed a new medication (Augmentin) for you at this appointment. We discussed the common and serious potential adverse effects of this medication and you can review these and more with the pharmacist when you pick up your medication.  Please follow the instructions for use carefully and notify us immediately if you have any problems taking this medication.  -follow up with your new doctor as scheduled

## 2014-01-16 NOTE — Progress Notes (Signed)
Pre visit review using our clinic review tool, if applicable. No additional management support is needed unless otherwise documented below in the visit note. 

## 2014-01-16 NOTE — Progress Notes (Signed)
No chief complaint on file.   HPI:  Acute visit for sinus congestion: -PCP, Dr. Lenna Gilford not available -started: white nasal congestion, sinus pain, maxillary tooth pain dizzy at times when turns head suddenly, drainage throat, pressure in ears -symptoms:nasal congestion, sore throat, cough -denies:fever, SOB, NVD,  -has tried: allegra daily year round, saline spray -sick contacts/travel/risks: denies flu exposure, tick exposure or or Ebola risks -Hx of: allergies, sees allergist - reports has severe allergies -has history of vertigo - usually with sinus issues, hx of sinus infection  ROS: See pertinent positives and negatives per HPI.  Past Medical History  Diagnosis Date  . Allergy   . GERD (gastroesophageal reflux disease)   . MVP (mitral valve prolapse)   . Hyperlipidemia     per pt NO  . CAD (coronary artery disease)   . Allergic rhinitis   . HTN (hypertension)   . Lumbar back pain   . Anxiety   . Migraines   . Abnormal Pap smear   . Anemia   . History of shingles 1/15  . Hx of echocardiogram 2009    Showed normal systolic and diastolic function. she does have borderline mitral valve prolapse.  [name] History of stress test 06/2011    Abnormal Myocadial perfusion scan demomstrating an attenuation defect in the anterior region of the myocardium, No ischemia or infaract/scar is seenin the remaining myocardium. Excercise capacity 12 METS.  [name] Post-operative nausea and vomiting   . 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 Cesarean section      twice  . Wrist surgery      right  . Hysteroscopy    . Cryoablation      times 2 for abn paps    Family History  Problem Relation Age of Onset  . Breast cancer Mother   . Heart disease Mother   . Diabetes Mother   . Hypertension Mother   . Prostate cancer Father   . Diabetes Sister   . Hypertension Sister   . Heart disease Brother   . Other  Brother     CHF, had heart transplant    History   Social History  . Marital Status: Married    Spouse Name: N/A    Number of Children: 2  . Years of Education: N/A   Occupational History  . mortgage loan specialist    Social History Main Topics  . Smoking status: Never Smoker   . Smokeless tobacco: Never Used  . Alcohol Use: No  . Drug Use: No  . Sexual Activity: Not Currently    Partners: Male    Birth Control/ Protection: Abstinence   Other Topics Concern  . None   Social History Narrative  . None    Current outpatient prescriptions:atenolol (TENORMIN) 50 MG tablet, Take 1 tablet (50 mg total) by mouth daily., Disp: 30 tablet, Rfl: 10;  fexofenadine (ALLEGRA) 180 MG tablet, Take 180 mg by mouth daily.  , Disp: , Rfl: ;  Multiple Vitamins-Calcium (ONE-A-DAY WOMENS PO), Take by mouth. Alive Womens One  A Day Chewables-Take 2 daily, Disp: , Rfl: ;  pantoprazole (PROTONIX) 40 MG tablet, Take 40 mg by mouth as needed. Reflux symptoms, Disp: , Rfl:  simvastatin (ZOCOR) 40 MG tablet, TAKE 1 TABLET BY MOUTH EVERY DAY, Disp: 90 tablet, Rfl: 3;  Vitamin D, Ergocalciferol, (DRISDOL) 50000 UNITS CAPS capsule, TAKE 1 CAPSULE BY MOUTH ONCE WEEKLY, Disp: 26 capsule,  Rfl: 2;  amoxicillin-clavulanate (AUGMENTIN) 875-125 MG per tablet, Take 1 tablet by mouth 2 (two) times daily., Disp: 20 tablet, Rfl: 0  EXAM:  Filed Vitals:   01/16/14 1527  BP: 142/76  Pulse: 56  Temp: 98 F (36.7 C)    Body mass index is 30.39 kg/(m^2).  GENERAL: vitals reviewed and listed above, alert, oriented, appears well hydrated and in no acute distress  HEENT: atraumatic, conjunttiva clear, no obvious abnormalities on inspection of external nose and ears, normal appearance of ear canals and TMs, clear nasal congestion, mild post oropharyngeal erythema with PND, no tonsillar edema or exudate, no sinus TTP  NECK: no obvious masses on inspection  LUNGS: clear to auscultation bilaterally, no wheezes, rales  or rhonchi, good air movement  CV: HRRR, no peripheral edema  MS: moves all extremities without noticeable abnormality  PSYCH: pleasant and cooperative, no obvious depression or anxiety  NEURO: [place] II-XII grossly intact, finger to nose normal  ASSESSMENT AND PLAN:  Discussed the following assessment and plan:  Rhinosinusitis - Plan: amoxicillin-clavulanate (AUGMENTIN) 875-125 MG per tablet  -given HPI and exam findings today, suspect allergic rhinitis with possible overlying bacterial sinusitis. -We discussed treatment side effects, likely course, antibiotic misuse, transmission, and signs of developing a serious illness. -her vertigo is likely related to this as she reports always get this when allergies or sinuses flare up. Advised of other causes of vertigo and return precautions discussed. -of course, we advised to return or notify a doctor immediately if symptoms worsen or persist or new concerns arise.    Patient Instructions  -allegra daily  -nasocort daily   -As we discussed, we have prescribed a new medication (Augmentin) for you at this appointment. We discussed the common and serious potential adverse effects of this medication and you can review these and more with the pharmacist when you pick up your medication.  Please follow the instructions for use carefully and notify us immediately if you have any problems taking this medication.  -follow up with your new doctor as scheduled      Colin Benton R.

## 2014-01-19 ENCOUNTER — Telehealth: Payer: Self-pay | Admitting: Cardiovascular Disease

## 2014-01-19 MED ORDER — ATENOLOL 50 MG PO TABS: 50.0000 mg | ORAL_TABLET | Freq: Every day | ORAL | Status: DC

## 2014-01-19 NOTE — Telephone Encounter (Signed)
Pt need her Atenolol changed to #90 instead of #30. Please call to [phone].

## 2014-01-19 NOTE — Telephone Encounter (Signed)
Spoke with pt, aware new script sent into the pharm

## 2014-02-02 ENCOUNTER — Ambulatory Visit (INDEPENDENT_AMBULATORY_CARE_PROVIDER_SITE_OTHER): Payer: BC Managed Care – PPO | Admitting: Internal Medicine

## 2014-02-02 ENCOUNTER — Encounter: Payer: Self-pay | Admitting: Internal Medicine

## 2014-02-02 VITALS — BP 142/72 | HR 57 | Temp 97.3°F | Resp 18 | Ht 63.0 in | Wt 165.8 lb

## 2014-02-02 DIAGNOSIS — E7800 Pure hypercholesterolemia, unspecified: Secondary | ICD-10-CM

## 2014-02-02 DIAGNOSIS — K219 Gastro-esophageal reflux disease without esophagitis: Secondary | ICD-10-CM

## 2014-02-02 DIAGNOSIS — I491 Atrial premature depolarization: Secondary | ICD-10-CM

## 2014-02-02 DIAGNOSIS — J302 Other seasonal allergic rhinitis: Secondary | ICD-10-CM

## 2014-02-02 DIAGNOSIS — Z0000 Encounter for general adult medical examination without abnormal findings: Secondary | ICD-10-CM

## 2014-02-02 MED ORDER — LEVOCETIRIZINE DIHYDROCHLORIDE 5 MG PO TABS: 5.0000 mg | ORAL_TABLET | Freq: Every evening | ORAL | Status: DC

## 2014-02-02 MED ORDER — PANTOPRAZOLE SODIUM 40 MG PO TBEC: 40.0000 mg | DELAYED_RELEASE_TABLET | Freq: Every day | ORAL | Status: DC

## 2014-02-02 NOTE — Assessment & Plan Note (Signed)
Recent lipid panel good on zocor 40 mg daily

## 2014-02-02 NOTE — Progress Notes (Signed)
   Subjective:    Patient ID: Grace George, female    DOB: 09/19/1960, 53 y.o.   MRN: [id number]  HPI The patient is a 53 YO female who is coming in to establish care. She has PMH of mitral valve prolapse, HTN, hyperlipidemia, GERD. She does have a lot of problems with allergies and was recently taking amoxicillin for them. She has finished the course and is still having some troubles with congestion. She denies sore throat or cough. She is having nasal congestion and ear fullness. She denies problems with her balance or joints. She denies chest pain, abdominal pain, nausea, constipation, diarrhea.   Review of Systems  Constitutional: Negative for fever, chills, activity change, appetite change and fatigue.  HENT: Positive for congestion, ear discharge, ear pain, postnasal drip, rhinorrhea and sinus pressure. Negative for sore throat.   Respiratory: Negative for cough, chest tightness, shortness of breath and wheezing.   Cardiovascular: Negative for chest pain, palpitations and leg swelling.  Gastrointestinal: Negative for abdominal pain, diarrhea, constipation and abdominal distention.  Genitourinary: Negative.   Musculoskeletal: Negative.   Skin: Negative.   Neurological: Negative for dizziness, weakness, light-headedness and headaches.      Objective:   Physical Exam  Vitals reviewed. Constitutional: She is oriented to person, place, and time. She appears well-developed and well-nourished.  HENT:  Head: Normocephalic and atraumatic.  Bilateral ears with redness in the canals, TMs clear. Oropharynx with some erythema but no drainage or pus.   Eyes: EOM are normal.  Neck: Normal range of motion.  Cardiovascular: Normal rate and regular rhythm.   Pulmonary/Chest: Effort normal and breath sounds normal. No respiratory distress. She has no wheezes. She has no rales.  Abdominal: Soft. Bowel sounds are normal. She exhibits no distension. There is no tenderness.  Neurological: She is alert  and oriented to person, place, and time. Coordination normal.  Skin: Skin is warm and dry.   Filed Vitals:   02/02/14 0928  BP: 142/72  Pulse: 57  Temp: 97.3 F (36.3 C)  TempSrc: Oral  Resp: 18  Height: 5\' 3"  (1.6 m)  Weight: 165 lb 12.8 oz (75.206 kg)  SpO2: 97%      Assessment & Plan:

## 2014-02-02 NOTE — Assessment & Plan Note (Signed)
Has been out of protonix for some time and would like to get back on since she has noticed some acid reflux since stopping.

## 2014-02-02 NOTE — Assessment & Plan Note (Signed)
Up to date on screening. She has egg allergy and declines flu shot although we have egg free flu shot.

## 2014-02-02 NOTE — Patient Instructions (Signed)
We will have you stop using the allegra and start a stronger allergy medicine called xyzal (also called levocetirizine). Take 1 pill per day.   We will have you double up on the nasocort and use 2 puffs in each nostril twice a day until your allergies are better then go back to using it once a day. We do not think that you need more antibiotics at this time.  The other thing that may help is to go back to using the netty pot to help drain out and clear the sinuses.   Come back in about 1 year for a check up if everything is going well. We have sent in your refills and if you have problems or questions before then please feel free to call our office.

## 2014-02-02 NOTE — Progress Notes (Signed)
Pre visit review using our clinic review tool, if applicable. No additional management support is needed unless otherwise documented below in the visit note. 

## 2014-02-02 NOTE — Assessment & Plan Note (Signed)
She is currently taking allegra, will stop and rx xyzal. She is using nasocort once daily and advised her to increase to BID while sinuses are bad. She has used netty pot in the past with good success and encouraged her to use this again as it will help a lot. No further antibiotics needed at this time.

## 2014-02-02 NOTE — Assessment & Plan Note (Signed)
She takes atenolol 50 mg daily. HR today 57.

## 2014-02-21 ENCOUNTER — Other Ambulatory Visit: Payer: Self-pay

## 2014-02-21 MED ORDER — SIMVASTATIN 40 MG PO TABS: 40.0000 mg | ORAL_TABLET | Freq: Every day | ORAL | Status: DC

## 2014-02-27 ENCOUNTER — Encounter: Payer: Self-pay | Admitting: Internal Medicine

## 2014-03-20 ENCOUNTER — Telehealth: Payer: Self-pay | Admitting: Pulmonary Disease

## 2014-03-20 ENCOUNTER — Telehealth: Payer: Self-pay | Admitting: Internal Medicine

## 2014-03-20 MED ORDER — INFLUENZA VAC TISS-CULT SUBUNT 0.5 ML IM SUSY: 0.5000 mL | PREFILLED_SYRINGE | Freq: Once | INTRAMUSCULAR | Status: DC

## 2014-03-20 NOTE — Telephone Encounter (Signed)
Pt requesting order/prescription for flu shot (she is allergic to eggs). [facility]

## 2014-03-20 NOTE — Telephone Encounter (Signed)
Notified [facility]

## 2014-03-22 ENCOUNTER — Ambulatory Visit (INDEPENDENT_AMBULATORY_CARE_PROVIDER_SITE_OTHER): Payer: BC Managed Care – PPO | Admitting: Geriatric Medicine

## 2014-03-22 DIAGNOSIS — Z23 Encounter for immunization: Secondary | ICD-10-CM

## 2014-03-22 DIAGNOSIS — Z418 Encounter for other procedures for purposes other than remedying health state: Secondary | ICD-10-CM

## 2014-03-22 DIAGNOSIS — Z299 Encounter for prophylactic measures, unspecified: Secondary | ICD-10-CM

## 2014-06-22 ENCOUNTER — Telehealth: Payer: Self-pay | Admitting: Certified Nurse Midwife

## 2014-06-22 NOTE — Telephone Encounter (Signed)
Patient decreased Lexapro to 5 mg for one month and came completely off Sept 10th of 2015.

## 2014-06-22 NOTE — Telephone Encounter (Signed)
When did she stop Lexapro, since we started last year and I saw her, I will consider an Rx until visit, but will need to keep appointment

## 2014-06-22 NOTE — Telephone Encounter (Signed)
Left message to call Zaylin Pistilli at [phone]. 

## 2014-06-22 NOTE — Telephone Encounter (Signed)
Patient is asking for a new prescription. Patient says she needs something for her nerves. Confirmed pharmacy on file with patient. Last seen 06/22/13.

## 2014-06-22 NOTE — Telephone Encounter (Signed)
Spoke with patient. Patient states that she wants to get restarted on "something for my nerves." Patient was previously on Lexapro 10mg  daily but came off due to weight gain. "I have an annual exam in March with Debbi but I do not think I can wait that long. I was hoping she could give me something else to start until then. Like xanax or something to help me calm down." Advised patient this is often a prescription we are unable to prescribe and will speak with Regina Eck CNM regarding alternatives and recommendations. Patient is agreeable.  Regina Eck CNM is there an alternative medication she could try to Lexapro until aex? Or should patient see PCP?

## 2014-06-23 NOTE — Telephone Encounter (Signed)
Patient requesting start on different medication due to weight gain on Lexapro. Please advise.

## 2014-06-23 NOTE — Telephone Encounter (Signed)
Ok to restart Lexapro 5mg  initially  And see if this manages her symptoms she will need to advise if no change

## 2014-06-24 NOTE — Telephone Encounter (Signed)
Lexapro worked well, I feel this best choice

## 2014-06-26 NOTE — Telephone Encounter (Signed)
Left message to call Heidi Maclin at [phone]. 

## 2014-06-26 NOTE — Telephone Encounter (Signed)
Spoke with patient. Advised patient of message as seen below from Regina Eck CNM. Patient states "I really do not want to do that. I really did not like that medicine." Advised Regina Eck CNM feels this is the best choice at this time for patient. Patient declines. Advised will let Regina Eck CNM know and if she needs anything before annual exam to give our office a call. Patient is agreeable.  Routing to provider for final review. Patient agreeable to disposition. Will close encounter

## 2014-06-28 ENCOUNTER — Ambulatory Visit: Payer: BC Managed Care – PPO | Admitting: Certified Nurse Midwife

## 2014-07-20 ENCOUNTER — Encounter: Payer: Self-pay | Admitting: Certified Nurse Midwife

## 2014-07-20 ENCOUNTER — Ambulatory Visit (INDEPENDENT_AMBULATORY_CARE_PROVIDER_SITE_OTHER): Payer: BC Managed Care – PPO | Admitting: Certified Nurse Midwife

## 2014-07-20 VITALS — BP 120/70 | HR 72 | Resp 16 | Ht 63.25 in | Wt 164.0 lb

## 2014-07-20 DIAGNOSIS — Z124 Encounter for screening for malignant neoplasm of cervix: Secondary | ICD-10-CM

## 2014-07-20 DIAGNOSIS — F419 Anxiety disorder, unspecified: Secondary | ICD-10-CM

## 2014-07-20 DIAGNOSIS — F418 Other specified anxiety disorders: Secondary | ICD-10-CM | POA: Diagnosis not present

## 2014-07-20 DIAGNOSIS — Z01419 Encounter for gynecological examination (general) (routine) without abnormal findings: Secondary | ICD-10-CM

## 2014-07-20 DIAGNOSIS — Z0000 Encounter for general adult medical examination without abnormal findings: Secondary | ICD-10-CM | POA: Diagnosis not present

## 2014-07-20 DIAGNOSIS — F32A Depression, unspecified: Secondary | ICD-10-CM

## 2014-07-20 DIAGNOSIS — F329 Major depressive disorder, single episode, unspecified: Secondary | ICD-10-CM

## 2014-07-20 LAB — POCT URINALYSIS DIPSTICK
BILIRUBIN UA: NEGATIVE
Glucose, UA: NEGATIVE
KETONES UA: NEGATIVE
LEUKOCYTES UA: NEGATIVE
Nitrite, UA: NEGATIVE
PH UA: 5
PROTEIN UA: NEGATIVE
RBC UA: NEGATIVE
Urobilinogen, UA: NEGATIVE

## 2014-07-20 NOTE — Patient Instructions (Signed)

## 2014-07-20 NOTE — Progress Notes (Signed)
54 y.o. [id number] Married  African American Fe here for annual exam.  Menopausal no HRT. Denies vaginal bleeding or vaginal dryness. Sees [facility] for cholesterol management, aex/ labs. Continues with anxiety/depression at times. Work is great, no issues, but feels very isolated. Patient is trying to get outside and exercise now. Previous years ago use of Xanax frequently for these symptoms. No other health issues today.  Patient's last menstrual period was 03/28/2010.          Sexually active: No.  The current method of family planning is post menopausal status.    Exercising: No.  exercise Smoker:  no  Health Maintenance: Pap: 06-17-12 neg HPV HR neg MMG: 10-11-13 category c density,birads 1:neg Colonoscopy:  2015 neg per patient BMD:   none TDaP:  03-22-14 Labs: Poct urine-neg, Hgb-13.2 Self breast exam: done monthly   reports that she has never smoked. She has never used smokeless tobacco. She reports that she does not drink alcohol or use illicit drugs.  Past Medical History  Diagnosis Date  . Allergy   . GERD (gastroesophageal reflux disease)   . MVP (mitral valve prolapse)   . Hyperlipidemia     per pt NO  . CAD (coronary artery disease)   . Allergic rhinitis   . HTN (hypertension)   . Lumbar back pain   . Anxiety   . Migraines   . Abnormal Pap smear   . Anemia   . History of shingles 1/15  . Hx of echocardiogram 2009    Showed normal systolic and diastolic function. she does have borderline mitral valve prolapse.  [name] History of stress test 06/2011    Abnormal Myocadial perfusion scan demomstrating an attenuation defect in the anterior region of the myocardium, No ischemia or infaract/scar is seenin the remaining myocardium. Excercise capacity 12 METS.  [name] Post-operative nausea and vomiting   . 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 Cesarean section      twice  . Wrist surgery     right  . Hysteroscopy    . Cryoablation      times 2 for abn paps    Current Outpatient Prescriptions  Medication Sig Dispense Refill  . atenolol (TENORMIN) 50 MG tablet Take 1 tablet (50 mg total) by mouth daily. 90 tablet 2  . levocetirizine (XYZAL) 5 MG tablet Take 1 tablet (5 mg total) by mouth every evening. 30 tablet 6  . Multiple Vitamins-Calcium (ONE-A-DAY WOMENS PO) Take by mouth. Alive Womens One  A Day Chewables-Take 2 daily    . simvastatin (ZOCOR) 40 MG tablet Take 1 tablet (40 mg total) by mouth daily. 90 tablet 1  . Vitamin D, Ergocalciferol, (DRISDOL) 50000 UNITS CAPS capsule TAKE 1 CAPSULE BY MOUTH ONCE WEEKLY (Patient taking differently: every other week) 26 capsule 2  . pantoprazole (PROTONIX) 40 MG tablet Take 1 tablet (40 mg total) by mouth daily. Reflux symptoms 30 tablet 6   No current facility-administered medications for this visit.    Family History  Problem Relation Age of Onset  . Breast cancer Mother   . Heart disease Mother   . Diabetes Mother   . Hypertension Mother   . Prostate cancer Father   . Diabetes Sister   . Hypertension Sister   . Heart disease Brother   . Other Brother     CHF, had heart transplant    ROS:  Pertinent items are noted in  HPI.  Otherwise, a comprehensive ROS was negative.  Exam:   BP 120/70 mmHg  Pulse 72  Resp 16  Ht 5' 3.25" (1.607 m)  Wt 164 lb (74.39 kg)  BMI 28.81 kg/m2  LMP 03/28/2010 Height: 5' 3.25" (160.7 cm) Ht Readings from Last 3 Encounters:  07/20/14 5' 3.25" (1.607 m)  02/02/14 5\' 3"  (1.6 m)  01/16/14 5\' 3"  (1.6 m)    General appearance: alert, cooperative and appears stated age Head: Normocephalic, without obvious abnormality, atraumatic Neck: no adenopathy, supple, symmetrical, trachea midline and thyroid normal to inspection and palpation Lungs: clear to auscultation bilaterally Breasts: normal appearance, no masses or tenderness, No nipple retraction or dimpling, No nipple discharge or  bleeding, No axillary or supraclavicular adenopathy Heart: regular rate and rhythm Abdomen: soft, non-tender; no masses,  no organomegaly Extremities: extremities normal, atraumatic, no cyanosis or edema Skin: Skin color, texture, turgor normal. No rashes or lesions Lymph nodes: Cervical, supraclavicular, and axillary nodes normal. No abnormal inguinal nodes palpated Neurologic: Grossly normal   Pelvic: External genitalia:  no lesions              Urethra:  normal appearing urethra with no masses, tenderness or lesions              Bartholin's and Skene's: normal                 Vagina: normal appearing vagina with normal color and discharge, no lesions              Cervix: normal, non tender, no lesions              Pap taken: Yes.   Bimanual Exam:  Uterus:  normal size, contour, position, consistency, mobility, non-tender              Adnexa: normal adnexa and no mass, fullness, tenderness               Rectovaginal: Confirms               Anus:  normal sphincter tone, no lesions  Chaperone present: Yes  A:  Well Woman with normal exam  Menopausal no HRT  Anxiety/Depression continues to occur, declines medication or counseling or Psychiatry.  Cholesterol management with cardiology  History of abnormal pap with cryo x 2 normal pap smear now  P:   Reviewed health and wellness pertinent to exam  Patient aware to advise if vaginal bleeding  Discussed patient to work on exercise as her therapy she declines medication or other referral. Try to talk with sisters for support. Patient will advise if she needs assistance. Denies thought of self or other harm. Advised if she did have these thoughts to see ER help.  Labs TSH, Vitamin D  Continue follow up as indicated with MD  Pap smear taken today with HPVHR   counseled on breast self exam, mammography screening, adequate intake of calcium and vitamin D, diet and exercise, Kegel's exercises  return annually or prn  An After Visit  Summary was printed and given to the patient.  20 minutes spent with patient in discussion of depression/anxiety management.

## 2014-07-21 LAB — VITAMIN D 25 HYDROXY (VIT D DEFICIENCY, FRACTURES): Vit D, 25-Hydroxy: 35 ng/mL (ref 30–100)

## 2014-07-21 LAB — IPS PAP TEST WITH REFLEX TO HPV

## 2014-07-21 LAB — TSH: TSH: 2.857 u[IU]/mL (ref 0.350–4.500)

## 2014-07-24 LAB — HEMOGLOBIN, FINGERSTICK: Hemoglobin, fingerstick: 13.2 g/dL (ref 12.0–16.0)

## 2014-07-26 NOTE — Progress Notes (Signed)
Reviewed personally.  M. Suzanne Zyshawn Bohnenkamp, MD.  

## 2014-07-31 ENCOUNTER — Encounter: Payer: Self-pay | Admitting: Cardiovascular Disease

## 2014-08-01 ENCOUNTER — Telehealth: Payer: Self-pay | Admitting: Cardiovascular Disease

## 2014-08-01 NOTE — Telephone Encounter (Signed)
If she is concerned about CP, should be seen in ER, otherwise can be seen by Dr [name] as scheduled.  Kirk Ruths

## 2014-08-01 NOTE — Telephone Encounter (Signed)
Pt is calling in stating that for the past couple of days she has been having some chest pain and pain across her shoulder blades and in her left arm. She would like to be seen if possible. Please call back  Thanks

## 2014-08-01 NOTE — Telephone Encounter (Signed)
Pt c/o chest & arm pain 2 nights ago. Last night had epigastric & shoulder blade pain. No other symptoms noted.  Reports no pain now. She'd had pain this morning still, took Phazyme OTC & this resolved her pain.  She would like to be seen in office. Advised her I would defer to [facility] & see if same-day add-in appropriate.  Has appt to be seen 4/14 by Dr. [name].

## 2014-08-01 NOTE — Telephone Encounter (Signed)
Advised pt on Dr. Jacalyn Lefevre recommendations, she voiced understanding.

## 2014-08-02 ENCOUNTER — Other Ambulatory Visit: Payer: Self-pay | Admitting: *Deleted

## 2014-08-02 DIAGNOSIS — E7800 Pure hypercholesterolemia, unspecified: Secondary | ICD-10-CM

## 2014-08-02 DIAGNOSIS — Z79899 Other long term (current) drug therapy: Secondary | ICD-10-CM

## 2014-08-02 DIAGNOSIS — I10 Essential (primary) hypertension: Secondary | ICD-10-CM

## 2014-08-02 DIAGNOSIS — I25810 Atherosclerosis of coronary artery bypass graft(s) without angina pectoris: Secondary | ICD-10-CM

## 2014-08-04 LAB — TSH: TSH: 3.892 u[IU]/mL (ref 0.350–4.500)

## 2014-08-04 LAB — COMPREHENSIVE METABOLIC PANEL
ALBUMIN: 4.3 g/dL (ref 3.5–5.2)
ALT: 14 U/L (ref 0–35)
AST: 20 U/L (ref 0–37)
Alkaline Phosphatase: 82 U/L (ref 39–117)
BUN: 9 mg/dL (ref 6–23)
CO2: 27 meq/L (ref 19–32)
CREATININE: 0.78 mg/dL (ref 0.50–1.10)
Calcium: 9.5 mg/dL (ref 8.4–10.5)
Chloride: 106 mEq/L (ref 96–112)
GLUCOSE: 85 mg/dL (ref 70–99)
POTASSIUM: 4.7 meq/L (ref 3.5–5.3)
Sodium: 142 mEq/L (ref 135–145)
Total Bilirubin: 0.7 mg/dL (ref 0.2–1.2)
Total Protein: 6.9 g/dL (ref 6.0–8.3)

## 2014-08-04 LAB — LIPID PANEL
CHOL/HDL RATIO: 2.2 ratio
Cholesterol: 133 mg/dL (ref 0–200)
HDL: 60 mg/dL (ref >=46)
LDL CALC: 61 mg/dL (ref 0–99)
TRIGLYCERIDES: 60 mg/dL (ref <150)
VLDL: 12 mg/dL (ref 0–40)

## 2014-08-05 LAB — CBC
HCT: 39.9 % (ref 36.0–46.0)
HEMOGLOBIN: 13.1 g/dL (ref 12.0–15.0)
MCH: 29.3 pg (ref 26.0–34.0)
MCHC: 32.8 g/dL (ref 30.0–36.0)
MCV: 89.3 fL (ref 78.0–100.0)
MPV: 12.4 fL (ref 8.6–12.4)
PLATELETS: 181 10*3/uL (ref 150–400)
RBC: 4.47 MIL/uL (ref 3.87–5.11)
RDW: 14 % (ref 11.5–15.5)
WBC: 3.9 10*3/uL — ABNORMAL LOW (ref 4.0–10.5)

## 2014-08-10 ENCOUNTER — Ambulatory Visit (INDEPENDENT_AMBULATORY_CARE_PROVIDER_SITE_OTHER): Payer: BC Managed Care – PPO | Admitting: Cardiovascular Disease

## 2014-08-10 ENCOUNTER — Encounter: Payer: Self-pay | Admitting: Cardiovascular Disease

## 2014-08-10 VITALS — BP 118/76 | HR 60 | Ht 63.0 in | Wt 162.3 lb

## 2014-08-10 DIAGNOSIS — E7800 Pure hypercholesterolemia, unspecified: Secondary | ICD-10-CM

## 2014-08-10 DIAGNOSIS — I491 Atrial premature depolarization: Secondary | ICD-10-CM

## 2014-08-10 DIAGNOSIS — I10 Essential (primary) hypertension: Secondary | ICD-10-CM

## 2014-08-10 DIAGNOSIS — K219 Gastro-esophageal reflux disease without esophagitis: Secondary | ICD-10-CM | POA: Diagnosis not present

## 2014-08-10 DIAGNOSIS — I059 Rheumatic mitral valve disease, unspecified: Secondary | ICD-10-CM

## 2014-08-10 NOTE — Patient Instructions (Signed)
Your physician wants you to follow-up in: 1 year or sooner if needed. You will receive a reminder letter in the mail two months in advance. If you don't receive a letter, please call our office to schedule the follow-up appointment.  

## 2014-08-10 NOTE — Progress Notes (Signed)
Patient ID: Grace George, female   DOB: 07-10-1960, 54 y.o.   MRN: [id number]     HPI: Grace George is a 54 y.o. female who presents to the office for one year cardiology evaluation.  Grace George has a history of hypertension, hyperlipidemia, and palpitations and has been treated with beta blocker therapy.  An echo Doppler study in 2009, showed normal systolic and diastolic function and she did have borderline mitral valve prolapse.  Over the past year, she feels that she has done well.  She now sees Dr. Doug Sou in place of Dr. Lenna Gilford who is retiring.  She has been on atenolol 50 mg daily for her palpitations.  She also has been on simvastatin 40 mg for hyperlipidemia.  She underwent recent laboratory 8 days ago on 08/02/2014 which revealed a hemoglobin of 13.1, hematocrit 39.9.  A competent metabolic panel was entirely normal.  She had normal renal function and LFTs.  Lipid studies were excellent with a total cholesterol 133, triglycerides 60, HDL 60, and LDL 61.  TSH was normal.  She denies any chest pain, or change in exercise tolerance.  She is unaware of any recurrent palpitations.  She does not routinely exercise.  She presents for one-year evaluation  Past Medical History  Diagnosis Date  . Allergy   . GERD (gastroesophageal reflux disease)   . MVP (mitral valve prolapse)   . Hyperlipidemia     per pt NO  . CAD (coronary artery disease)   . Allergic rhinitis   . HTN (hypertension)   . Lumbar back pain   . Anxiety   . Migraines   . Abnormal Pap smear   . Anemia   . History of shingles 1/15  . Hx of echocardiogram 2009    Showed normal systolic and diastolic function. she does have borderline mitral valve prolapse.  [name] History of stress test 06/2011    Abnormal Myocadial perfusion scan demomstrating an attenuation defect in the anterior region of the myocardium, No ischemia or infaract/scar is seenin the remaining myocardium. Excercise capacity 12 METS.  [name] Post-operative nausea  and vomiting   . 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 Cesarean section      twice  . Wrist surgery      right  . Hysteroscopy    . Cryoablation      times 2 for abn paps    Allergies  Allergen Reactions  . Eggs Or Egg-Derived Products     Causes a rash, if she eats a lot!  . Sulfa Antibiotics     Rash & itching    Current Outpatient Prescriptions  Medication Sig Dispense Refill  . atenolol (TENORMIN) 50 MG tablet Take 1 tablet (50 mg total) by mouth daily. 90 tablet 2  . levocetirizine (XYZAL) 5 MG tablet Take 1 tablet (5 mg total) by mouth every evening. 30 tablet 6  . Multiple Vitamins-Calcium (ONE-A-DAY WOMENS PO) Take by mouth. Alive Womens One  A Day Chewables-Take 2 daily    . pantoprazole (PROTONIX) 40 MG tablet Take 1 tablet (40 mg total) by mouth daily. Reflux symptoms 30 tablet 6  . simvastatin (ZOCOR) 40 MG tablet Take 1 tablet (40 mg total) by mouth daily. 90 tablet 1  . Vitamin D, Ergocalciferol, (DRISDOL) 50000 UNITS CAPS capsule TAKE 1 CAPSULE BY MOUTH ONCE WEEKLY (Patient taking differently: every other week) 26 capsule 2   No current  facility-administered medications for this visit.    History   Social History  . Marital Status: Married    Spouse Name: N/A  . Number of Children: 2  . Years of Education: N/A   Occupational History  . mortgage loan specialist    Social History Main Topics  . Smoking status: Never Smoker   . Smokeless tobacco: Never Used  . Alcohol Use: No  . Drug Use: No  . Sexual Activity:    Partners: Male    Birth Control/ Protection: Post-menopausal   Other Topics Concern  . Not on file   Social History Narrative   Socially, she is married and has 2 children, currently age 69 and 3.  She does not yet have grandchildren.  There is no tobacco or alcohol use.  Of note, her brother underwent  cardiac transplantation .  A year and a half ago  and is doing well.  Family History  Problem Relation Age of Onset  . Breast cancer Mother   . Heart disease Mother   . Diabetes Mother   . Hypertension Mother   . Prostate cancer Father   . Diabetes Sister   . Hypertension Sister   . Heart disease Brother   . Other Brother     CHF, had heart transplant    ROS General: Negative; No fevers, chills, or night sweats;  HEENT: Negative; No changes in vision or hearing, sinus congestion, difficulty swallowing Pulmonary: Negative; No cough, wheezing, shortness of breath, hemoptysis Cardiovascular: Negative; No chest pain, presyncope, syncope, palpitations GI: History of GERD. GU: Negative; No dysuria, hematuria, or difficulty voiding Immunologic: Seasonal allergies Musculoskeletal: Negative; no myalgias, joint pain, or weakness Hematologic/Oncology: Negative; no easy bruising, bleeding Endocrine: Negative; no heat/cold intolerance; no diabetes Neuro: Negative; no changes in balance, headaches Skin: Negative; No rashes or skin lesions Psychiatric: Negative; No behavioral problems, depression Sleep: Negative; No snoring, daytime sleepiness, hypersomnolence, bruxism, restless legs, hypnogognic hallucinations, no cataplexy Other comprehensive 14 point system review is negative.   PE BP 118/76 mmHg  Pulse 60  Ht '5\' 3"'  (1.6 m)  Wt 162 lb 4.8 oz (73.619 kg)  BMI 28.76 kg/m2  LMP 03/28/2010  General: Alert, oriented, no distress.  Skin: normal turgor, no rashes HEENT: Normocephalic, atraumatic. Pupils round and reactive; sclera anicteric;no lid lag. Extraocular muscles intact;; no xanthelasmas. Nose without nasal septal hypertrophy Mouth/Parynx benign; Mallinpatti scale 2 Neck: No JVD, no carotid bruits; normal carotid upstroke Lungs: clear to ausculatation and percussion; no wheezing or rales Chest wall: no tenderness to palpitation Heart: RRR, s1 s2 normal; faint 1/6 systolic murmur; no diastolic murmur, rub thrills or  heaves Abdomen: soft, nontender; no hepatosplenomehaly, BS+; abdominal aorta nontender and not dilated by palpation. Back: no CVA tenderness Pulses 2+ Extremities: no clubbing cyanosis or edema, Homan's sign negative  Neurologic: grossly nonfocal; cranial nerves grossly normal. Psychologic: normal affect and mood.  ECG (independently read by me): Normal sinus rhythm.  Mild RV conduction delay with T-wave inversion V1 and V2, isolated PAC.  Borderline first-degree AV block with a PR interval at 22 ms.  April 2015 ECG (independently read by me): Sinus rhythm at 72 beats per minute with mild RV conduction delay.  She is previously noted T-wave inversion in leads V1 through V3.  She also has occasional PACs with different morphology to the P wave in these premature complexes.  QTc interval 47 ms.  PR interval 188 ms.  LABS:  BMET  BMP Latest Ref Rng 08/02/2014 08/10/2013  Glucose 70 - 99 mg/dL 85 89  BUN 6 - 23 mg/dL 9 5(L)  Creatinine 0.50 - 1.10 mg/dL 0.78 0.72  Sodium 135 - 145 mEq/L 142 142  Potassium 3.5 - 5.3 mEq/L 4.7 4.7  Chloride 96 - 112 mEq/L 106 106  CO2 19 - 32 mEq/L 27 27  Calcium 8.4 - 10.5 mg/dL 9.5 9.8     Hepatic Function Panel   Hepatic Function Latest Ref Rng 08/02/2014 08/10/2013  Total Protein 6.0 - 8.3 g/dL 6.9 7.2  Albumin 3.5 - 5.2 g/dL 4.3 4.5  AST 0 - 37 U/L 20 22  ALT 0 - 35 U/L 14 13  Alk Phosphatase 39 - 117 U/L 82 86  Total Bilirubin 0.2 - 1.2 mg/dL 0.7 0.6    CBC  CBC Latest Ref Rng 08/02/2014 08/10/2013  WBC 4.0 - 10.5 K/uL 3.9(L) 4.1  Hemoglobin 12.0 - 15.0 g/dL 13.1 13.4  Hematocrit 36.0 - 46.0 % 39.9 40.3  Platelets 150 - 400 K/uL 181 188   Lab Results  Component Value Date   TSH 3.892 08/02/2014    BNP No results found for: PROBNP  Lipid Panel     Component Value Date/Time   CHOL 133 08/02/2014 0802   TRIG 60 08/02/2014 0802   HDL 60 08/02/2014 0802   CHOLHDL 2.2 08/02/2014 0802   VLDL 12 08/02/2014 0802   LDLCALC 61 08/02/2014  0802     RADIOLOGY: No results found.    ASSESSMENT AND PLAN:  Grace George is a very pleasant 55 year old female who has a history of hypertension, hyperlipidemia, and palpitations.  She is currently on atenolol 50 mg daily and denies any awareness of recurrent arrhythmia or palpitations.  Her ECG today shows sinus rhythm at 60 bpm with one isolated PAC.  She has previously noted T-wave changes in V1 and V2, which I suspect is due to her mild RV conduction delay, but she does not meet right bundle branch block criteria.  Her blood pressure today is controlled.  Her lipid studies are excellent on her current dose of simvastatin 40 mg.  She continues to take Protonix 40 mg daily for GERD and this is stable.  She does have seasonal allergies.  I have recommended exercise at least 5 days a week for 30 minutes of moderate intensity.  As long as she remains stable, I will see her in one year for reevaluation or sooner if problems arise.  Time spent: 25 minutes  Troy Sine, MD, [facility]  08/10/2014 8:42 AM

## 2014-08-15 ENCOUNTER — Encounter: Payer: Self-pay | Admitting: Internal Medicine

## 2014-08-15 ENCOUNTER — Ambulatory Visit (INDEPENDENT_AMBULATORY_CARE_PROVIDER_SITE_OTHER): Payer: BC Managed Care – PPO | Admitting: Internal Medicine

## 2014-08-15 VITALS — BP 122/68 | HR 68 | Temp 98.5°F | Resp 16 | Wt 163.0 lb

## 2014-08-15 DIAGNOSIS — F418 Other specified anxiety disorders: Secondary | ICD-10-CM | POA: Insufficient documentation

## 2014-08-15 DIAGNOSIS — J302 Other seasonal allergic rhinitis: Secondary | ICD-10-CM

## 2014-08-15 MED ORDER — ALPRAZOLAM 0.5 MG PO TABS: 0.5000 mg | ORAL_TABLET | Freq: Two times a day (BID) | ORAL | Status: DC | PRN

## 2014-08-15 MED ORDER — LEVOCETIRIZINE DIHYDROCHLORIDE 5 MG PO TABS: 5.0000 mg | ORAL_TABLET | Freq: Every evening | ORAL | Status: DC

## 2014-08-15 MED ORDER — FLUTICASONE PROPIONATE 50 MCG/ACT NA SUSP: 2.0000 | Freq: Every day | NASAL | Status: DC

## 2014-08-15 NOTE — Assessment & Plan Note (Signed)
Much better overall but in exacerbation with the pollen. Will add flonase for during pollen season and continue with xyzal overall.

## 2014-08-15 NOTE — Progress Notes (Signed)
   Subjective:    Patient ID: Grace George, female    DOB: July 17, 1960, 54 y.o.   MRN: [id number]  HPI The patient is a 54 YO female coming in to follow up on her allergies. The xyzal was started last time and is doing well overall. She is struggling with the pollen right now and wants something extra for her increase drainage. She denies sinus pressure, fevers, chills, cough, SOB. Her new complaint is anxiety that is worse in the last 1-2 months. Her daughter-in-law is denying her access to her grandchildren (twins) who used to give her a lot of pleasure in life. She is finding it hard as she has no one to talk to about this. She is going on a trip soon and is worried about flying. She has had to use xanax in the past for brief periods of time successfully. Has not taken it in a long time.   Review of Systems  Constitutional: Negative for fever, chills, activity change, appetite change and fatigue.  HENT: Positive for congestion, postnasal drip and rhinorrhea. Negative for ear discharge, ear pain, sinus pressure and sore throat.   Respiratory: Negative for cough, chest tightness, shortness of breath and wheezing.   Cardiovascular: Negative for chest pain, palpitations and leg swelling.  Gastrointestinal: Negative for abdominal pain, diarrhea, constipation and abdominal distention.  Neurological: Negative for dizziness, weakness, light-headedness and headaches.  Psychiatric/Behavioral: Positive for dysphoric mood and decreased concentration. The patient is nervous/anxious.       Objective:   Physical Exam  Constitutional: She is oriented to person, place, and time. She appears well-developed and well-nourished.  HENT:  Head: Normocephalic and atraumatic.  Right Ear: External ear normal.  Left Ear: External ear normal.  Oropharynx with some erythema but no drainage or pus.   Eyes: EOM are normal.  Neck: Normal range of motion.  Cardiovascular: Normal rate and regular rhythm.     Pulmonary/Chest: Effort normal and breath sounds normal. No respiratory distress. She has no wheezes. She has no rales.  Abdominal: Soft. Bowel sounds are normal. She exhibits no distension. There is no tenderness.  Neurological: She is alert and oriented to person, place, and time. Coordination normal.  Skin: Skin is warm and dry.  Vitals reviewed.  Filed Vitals:   08/15/14 0806  BP: 122/68  Pulse: 68  Temp: 98.5 F (36.9 C)  TempSrc: Oral  Resp: 16  Weight: 163 lb (73.936 kg)  SpO2: 97%      Assessment & Plan:

## 2014-08-15 NOTE — Patient Instructions (Signed)
We will give you some xanax that you can use to help with anxiety up to twice a day. We will have you call us back to check in with [name] in several months and let us know if you are still having lots of anxiety. If so it may be worthwhile to get you on a daily anxiety medicine to help keep you from having to use the xanax.   Other strategies are to work on exercise and other activities to funnel the anxiety and help get rid of it.   We have sent in the flonase for the sinuses and the refill of the xyzal. Use the flonase 2 puffs in each nostril once a day until your allergy season is better.   Stress and Stress Management Stress is a normal reaction to life events. It is what you feel when life demands more than you are used to or more than you can handle. Some stress can be useful. For example, the stress reaction can help you catch the last bus of the day, study for a test, or meet a deadline at work. But stress that occurs too often or for too long can cause problems. It can affect your emotional health and interfere with relationships and normal daily activities. Too much stress can weaken your immune system and increase your risk for physical illness. If you already have a medical problem, stress can make it worse. CAUSES  All sorts of life events may cause stress. An event that causes stress for one person may not be stressful for another person. Major life events commonly cause stress. These may be positive or negative. Examples include losing your job, moving into a new home, getting married, having a baby, or losing a loved one. Less obvious life events may also cause stress, especially if they occur day after day or in combination. Examples include working long hours, driving in traffic, caring for children, being in debt, or being in a difficult relationship. SIGNS AND SYMPTOMS Stress may cause emotional symptoms including, the following:  Anxiety. This is feeling worried, afraid, on edge,  overwhelmed, or out of control.  Anger. This is feeling irritated or impatient.  Depression. This is feeling sad, down, helpless, or guilty.  Difficulty focusing, remembering, or making decisions. Stress may cause physical symptoms, including the following:   Aches and pains. These may affect your head, neck, back, stomach, or other areas of your body.  Tight muscles or clenched jaw.  Low energy or trouble sleeping. Stress may cause unhealthy behaviors, including the following:   Eating to feel better (overeating) or skipping meals.  Sleeping too little, too much, or both.  Working too much or putting off tasks (procrastination).  Smoking, drinking alcohol, or using drugs to feel better. DIAGNOSIS  Stress is diagnosed through an assessment by your health care provider. Your health care provider will ask questions about your symptoms and any stressful life events.Your health care provider will also ask about your medical history and may order blood tests or other tests. Certain medical conditions and medicine can cause physical symptoms similar to stress. Mental illness can cause emotional symptoms and unhealthy behaviors similar to stress. Your health care provider may refer you to a mental health professional for further evaluation.  TREATMENT  Stress management is the recommended treatment for stress.The goals of stress management are reducing stressful life events and coping with stress in healthy ways.  Techniques for reducing stressful life events include the following:  Stress identification. Self-monitor  for stress and identify what causes stress for you. These skills may help you to avoid some stressful events.  Time management. Set your priorities, keep a calendar of events, and learn to say "no." These tools can help you avoid making too many commitments. Techniques for coping with stress include the following:  Rethinking the problem. Try to think realistically about  stressful events rather than ignoring them or overreacting. Try to find the positives in a stressful situation rather than focusing on the negatives.  Exercise. Physical exercise can release both physical and emotional tension. The key is to find a form of exercise you enjoy and do it regularly.  Relaxation techniques. These relax the body and mind. Examples include yoga, meditation, tai chi, biofeedback, deep breathing, progressive muscle relaxation, listening to music, being out in nature, journaling, and other hobbies. Again, the key is to find one or more that you enjoy and can do regularly.  Healthy lifestyle. Eat a balanced diet, get plenty of sleep, and do not smoke. Avoid using alcohol or drugs to relax.  Strong support network. Spend time with family, friends, or other people you enjoy being around.Express your feelings and talk things over with someone you trust. Counseling or talktherapy with a mental health professional may be helpful if you are having difficulty managing stress on your own. Medicine is typically not recommended for the treatment of stress.Talk to your health care provider if you think you need medicine for symptoms of stress. HOME CARE INSTRUCTIONS  Keep all follow-up visits as directed by your health care provider.  Take all medicines as directed by your health care provider. SEEK MEDICAL CARE IF:  Your symptoms get worse or you start having new symptoms.  You feel overwhelmed by your problems and can no longer manage them on your own. SEEK IMMEDIATE MEDICAL CARE IF:  You feel like hurting yourself or someone else. Document Released: 10/08/2000 Document Revised: 08/29/2013 Document Reviewed: 12/07/2012 [facility] Patient Information 2015 [place], [place]. This information is not intended to replace advice given to you by your health care provider. Make sure you discuss any questions you have with your health care provider.

## 2014-08-15 NOTE — Progress Notes (Signed)
Pre visit review using our clinic review tool, if applicable. No additional management support is needed unless otherwise documented below in the visit note. 

## 2014-08-15 NOTE — Assessment & Plan Note (Signed)
New problem that is causing her a lot of stress and worry. Okay with short term xanax for trip and for stress. Have given her additional list of stress relieving activities. Advised that maybe talking to someone about how to go about solving this problem may make her feel a lot better. If still needing the xanax in 3 months we will start a medicine for control of her anxiety that she takes daily.

## 2014-09-16 ENCOUNTER — Other Ambulatory Visit: Payer: Self-pay | Admitting: Cardiovascular Disease

## 2014-09-18 NOTE — Telephone Encounter (Signed)
Rx(s) sent to pharmacy electronically.  

## 2014-10-24 ENCOUNTER — Other Ambulatory Visit: Payer: Self-pay | Admitting: Cardiovascular Disease

## 2015-02-07 ENCOUNTER — Telehealth: Payer: Self-pay | Admitting: Cardiovascular Disease

## 2015-02-07 NOTE — Telephone Encounter (Signed)
Spoke with patient and provided last cholesterol results for her work.

## 2015-02-07 NOTE — Telephone Encounter (Signed)
Pt need her last Cholesterol results.Please call and let her know.

## 2015-02-08 ENCOUNTER — Other Ambulatory Visit: Payer: Self-pay | Admitting: Obstetrics & Gynecology

## 2015-02-08 ENCOUNTER — Other Ambulatory Visit: Payer: Self-pay | Admitting: Certified Nurse Midwife

## 2015-02-08 NOTE — Telephone Encounter (Addendum)
Medication refill request: vitamin D Last AEX:  07/20/14 DL Next AEX: 07/26/15 DL Last MMG (if hormonal medication request): 10/17/14  Refill authorized: 09/14/14 #[id number]. Today please advise. Rx is expired

## 2015-02-08 NOTE — Telephone Encounter (Signed)
Medication refill request: Vitamin D Last AEX:  07/20/14 DL Next AEX: 07/26/15 DL Last MMG (if hormonal medication request): 10/17/14 [id number] Refill authorized: 09/13/14 #[id number]/ 2R. To [facility]

## 2015-03-01 ENCOUNTER — Ambulatory Visit (INDEPENDENT_AMBULATORY_CARE_PROVIDER_SITE_OTHER): Payer: BC Managed Care – PPO

## 2015-03-01 ENCOUNTER — Ambulatory Visit: Payer: BC Managed Care – PPO

## 2015-03-01 DIAGNOSIS — Z23 Encounter for immunization: Secondary | ICD-10-CM

## 2015-05-18 ENCOUNTER — Telehealth: Payer: Self-pay | Admitting: Cardiovascular Disease

## 2015-05-18 NOTE — Telephone Encounter (Signed)
1. What dental office are you calling from? Dr. Burt Ek    2. What is your office phone and fax number?  Office- [phone]  Fax-[phone]  3. What type of procedure is the patient having performed? Tooth extraction   4. What date is procedure scheduled? Did not state the date   5. What is your question (ex. Antibiotics prior to procedure, holding medication-we need to know how long dentist wants pt to hold med)? Antibiotics prior to procedure? 6.

## 2015-05-18 NOTE — Telephone Encounter (Signed)
Spoke with pt, aware no need for antibiotics

## 2015-05-18 NOTE — Telephone Encounter (Signed)
Pt wants to know if she needs to be pre-medicated before her cleaning and maybe an extraction. Her appointment is Thursday-05-24-15.

## 2015-05-21 NOTE — Telephone Encounter (Signed)
No need for antibiotics pre dental work

## 2015-05-22 ENCOUNTER — Encounter: Payer: Self-pay | Admitting: *Deleted

## 2015-05-22 NOTE — Telephone Encounter (Signed)
Returned call to Dr. Burt Ek, confirmed fax #, letter sent.

## 2015-07-11 ENCOUNTER — Telehealth: Payer: Self-pay | Admitting: Cardiovascular Disease

## 2015-07-11 DIAGNOSIS — I491 Atrial premature depolarization: Secondary | ICD-10-CM

## 2015-07-11 DIAGNOSIS — I10 Essential (primary) hypertension: Secondary | ICD-10-CM

## 2015-07-11 NOTE — Telephone Encounter (Signed)
New message   Pt wans and order for labs before office visit

## 2015-07-11 NOTE — Telephone Encounter (Signed)
Pt called wanting labs before next appt scheduled in May.  Labs ordered (CBC, TSH, CMET, Lipid) Called pt left message to call back.

## 2015-07-12 NOTE — Telephone Encounter (Signed)
Information given to patient. verbalized understanding 

## 2015-07-12 NOTE — Telephone Encounter (Signed)
Left message on phone message - contact office

## 2015-07-24 ENCOUNTER — Other Ambulatory Visit: Payer: Self-pay | Admitting: Cardiovascular Disease

## 2015-07-24 NOTE — Telephone Encounter (Signed)
REFILL 

## 2015-07-26 ENCOUNTER — Ambulatory Visit (INDEPENDENT_AMBULATORY_CARE_PROVIDER_SITE_OTHER): Payer: BC Managed Care – PPO | Admitting: Certified Nurse Midwife

## 2015-07-26 ENCOUNTER — Encounter: Payer: Self-pay | Admitting: Certified Nurse Midwife

## 2015-07-26 VITALS — BP 124/68 | HR 72 | Resp 16 | Ht 63.25 in | Wt 161.0 lb

## 2015-07-26 DIAGNOSIS — Z0000 Encounter for general adult medical examination without abnormal findings: Secondary | ICD-10-CM

## 2015-07-26 DIAGNOSIS — R6882 Decreased libido: Secondary | ICD-10-CM | POA: Diagnosis not present

## 2015-07-26 DIAGNOSIS — Z01419 Encounter for gynecological examination (general) (routine) without abnormal findings: Secondary | ICD-10-CM

## 2015-07-26 LAB — POCT URINALYSIS DIPSTICK
BILIRUBIN UA: NEGATIVE
Blood, UA: NEGATIVE
GLUCOSE UA: NEGATIVE
Ketones, UA: NEGATIVE
Leukocytes, UA: NEGATIVE
NITRITE UA: NEGATIVE
Protein, UA: NEGATIVE
UROBILINOGEN UA: NEGATIVE
pH, UA: 5

## 2015-07-26 LAB — HEPATITIS C ANTIBODY: HCV Ab: NEGATIVE

## 2015-07-26 LAB — HEMOGLOBIN, FINGERSTICK: HEMOGLOBIN, FINGERSTICK: 13.5 g/dL (ref 12.0–16.0)

## 2015-07-26 NOTE — Progress Notes (Signed)
54 y.o. [id number] Married  African American Fe here for annual exam. Menopausal no HRT. Denies vaginal bleeding or vaginal dryness. Sees cardiologist yearly who does all labs exam and manages Hypertension, cholesterol but does not check,  Vitamin D. No interest in being sexually active now, feels related to menopause, but working on relationship changes now. No health issues today. Has twin grandsons!!  Patient's last menstrual period was 03/28/2010.          Sexually active: No.  The current method of family planning is post menopausal status.    Exercising: No.  exercise Smoker:  no  Health Maintenance: Pap:  07-20-14 neg MMG:  10-17-14 category density,birads 1:neg Colonoscopy:  2015 neg per patient BMD:   none TDaP:  2015 Shingles: no Pneumonia: no Hep C and HIV: not done Labs: poct urine-neg, hgb- 13.5 Self breast exam: done monthly   reports that she has never smoked. She has never used smokeless tobacco. She reports that she does not drink alcohol or use illicit drugs.  Past Medical History  Diagnosis Date  . Allergy   . GERD (gastroesophageal reflux disease)   . MVP (mitral valve prolapse)   . Hyperlipidemia     per pt NO  . CAD (coronary artery disease)   . Allergic rhinitis   . HTN (hypertension)   . Lumbar back pain   . Anxiety   . Migraines   . Abnormal Pap smear   . Anemia   . History of shingles 1/15  . Hx of echocardiogram 2009    Showed normal systolic and diastolic function. she does have borderline mitral valve prolapse.  [name] History of stress test 06/2011    Abnormal Myocadial perfusion scan demomstrating an attenuation defect in the anterior region of the myocardium, No ischemia or infaract/scar is seenin the remaining myocardium. Excercise capacity 12 METS.  [name] Post-operative nausea and vomiting   . 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 Cesarean section      twice  .  Wrist surgery      right  . Hysteroscopy    . Cryoablation      times 2 for abn paps    Current Outpatient Prescriptions  Medication Sig Dispense Refill  . atenolol (TENORMIN) 50 MG tablet TAKE 1 TABLET (50 MG TOTAL) BY MOUTH DAILY. 90 tablet 0  . levocetirizine (XYZAL) 5 MG tablet Take 1 tablet (5 mg total) by mouth every evening. 90 tablet 3  . Multiple Vitamins-Calcium (ONE-A-DAY WOMENS PO) Take by mouth. Alive Womens One  A Day Chewables-Take 2 daily    . simvastatin (ZOCOR) 40 MG tablet TAKE 1 TABLET (40 MG TOTAL) BY MOUTH DAILY. 90 tablet 3  . Vitamin D, Ergocalciferol, (DRISDOL) 50000 UNITS CAPS capsule TAKE 1 CAPSULE BY MOUTH ONCE WEEKLY 26 capsule 0  . pantoprazole (PROTONIX) 40 MG tablet Take 1 tablet (40 mg total) by mouth daily. Reflux symptoms (Patient not taking: Reported on 07/26/2015) 30 tablet 6   No current facility-administered medications for this visit.    Family History  Problem Relation Age of Onset  . Breast cancer Mother   . Heart disease Mother   . Diabetes Mother   . Hypertension Mother   . Prostate cancer Father   . Diabetes Sister   . Hypertension Sister   . Heart disease Brother   . Other Brother     CHF, had heart transplant  ROS:  Pertinent items are noted in HPI.  Otherwise, a comprehensive ROS was negative.  Exam:   BP 124/68 mmHg  Pulse 72  Resp 16  Ht 5' 3.25" (1.607 m)  Wt 161 lb (73.029 kg)  BMI 28.28 kg/m2  LMP 03/28/2010 Height: 5' 3.25" (160.7 cm) Ht Readings from Last 3 Encounters:  07/26/15 5' 3.25" (1.607 m)  08/10/14 5\' 3"  (1.6 m)  07/20/14 5' 3.25" (1.607 m)    General appearance: alert, cooperative and appears stated age Head: Normocephalic, without obvious abnormality, atraumatic Neck: no adenopathy, supple, symmetrical, trachea midline and thyroid normal to inspection and palpation Lungs: clear to auscultation bilaterally Breasts: normal appearance, no masses or tenderness, No nipple retraction or dimpling, No  nipple discharge or bleeding, No axillary or supraclavicular adenopathy Heart: regular rate and rhythm Abdomen: soft, non-tender; no masses,  no organomegaly Extremities: extremities normal, atraumatic, no cyanosis or edema Skin: Skin color, texture, turgor normal. No rashes or lesions Lymph nodes: Cervical, supraclavicular, and axillary nodes normal. No abnormal inguinal nodes palpated Neurologic: Grossly normal   Pelvic: External genitalia:  no lesions              Urethra:  normal appearing urethra with no masses, tenderness or lesions              Bartholin's and Skene's: normal                 Vagina: normal appearing vagina with normal color and discharge, no lesions              Cervix: normal,non tender,no lesions              Pap taken: No. Bimanual Exam:  Uterus:  normal size, contour, position, consistency, mobility, non-tender              Adnexa: normal adnexa and no mass, fullness, tenderness               Rectovaginal: Confirms               Anus:  normal sphincter tone, no lesions  Chaperone present: yes  A:  Well Woman with normal exam  Menopausal no HRT  Vitamin D deficiency, recheck today  Decrease libido  Hypertension/cholesterol management with cardiologist    P:   Reviewed health and wellness pertinent to exam  Aware of need to evaluate if vaginal bleeding  Lab Vitamin D  Discussed normal changes with age change and menopause. Discussed working on relationship together and taking a date night. Questions addressed. Patient feels they can work on this together, will advise if concerns.  Continue follow up with MD as indicated.  Pap smear as above not taken today   counseled on breast self exam, mammography screening, adequate intake of calcium and vitamin D, diet and exercise   Rv aex, prn

## 2015-07-26 NOTE — Patient Instructions (Addendum)

## 2015-07-27 LAB — VITAMIN D 25 HYDROXY (VIT D DEFICIENCY, FRACTURES): VIT D 25 HYDROXY: 29 ng/mL — AB (ref 30–100)

## 2015-07-27 NOTE — Progress Notes (Signed)
Encounter reviewed Jill Jertson, MD   

## 2015-07-30 ENCOUNTER — Emergency Department (HOSPITAL_COMMUNITY)
Admission: EM | Admit: 2015-07-30 | Discharge: 2015-07-30 | Disposition: A | Discharge status: Home / Self Care | Payer: BC Managed Care – PPO | Attending: Emergency Medicine | Admitting: Emergency Medicine

## 2015-07-30 ENCOUNTER — Telehealth: Payer: Self-pay | Admitting: Cardiovascular Disease

## 2015-07-30 ENCOUNTER — Encounter (HOSPITAL_COMMUNITY): Payer: Self-pay | Admitting: *Deleted

## 2015-07-30 ENCOUNTER — Emergency Department (HOSPITAL_COMMUNITY): Payer: BC Managed Care – PPO

## 2015-07-30 DIAGNOSIS — I499 Cardiac arrhythmia, unspecified: Secondary | ICD-10-CM | POA: Insufficient documentation

## 2015-07-30 DIAGNOSIS — Z8719 Personal history of other diseases of the digestive system: Secondary | ICD-10-CM | POA: Insufficient documentation

## 2015-07-30 DIAGNOSIS — G43909 Migraine, unspecified, not intractable, without status migrainosus: Secondary | ICD-10-CM | POA: Diagnosis not present

## 2015-07-30 DIAGNOSIS — R0602 Shortness of breath: Secondary | ICD-10-CM | POA: Diagnosis present

## 2015-07-30 DIAGNOSIS — I341 Nonrheumatic mitral (valve) prolapse: Secondary | ICD-10-CM | POA: Insufficient documentation

## 2015-07-30 DIAGNOSIS — Z79899 Other long term (current) drug therapy: Secondary | ICD-10-CM | POA: Diagnosis not present

## 2015-07-30 DIAGNOSIS — E785 Hyperlipidemia, unspecified: Secondary | ICD-10-CM | POA: Diagnosis not present

## 2015-07-30 DIAGNOSIS — F419 Anxiety disorder, unspecified: Secondary | ICD-10-CM | POA: Diagnosis not present

## 2015-07-30 DIAGNOSIS — Z862 Personal history of diseases of the blood and blood-forming organs and certain disorders involving the immune mechanism: Secondary | ICD-10-CM | POA: Diagnosis not present

## 2015-07-30 DIAGNOSIS — Z8619 Personal history of other infectious and parasitic diseases: Secondary | ICD-10-CM | POA: Insufficient documentation

## 2015-07-30 DIAGNOSIS — I2510 Atherosclerotic heart disease of native coronary artery without angina pectoris: Secondary | ICD-10-CM | POA: Insufficient documentation

## 2015-07-30 DIAGNOSIS — I10 Essential (primary) hypertension: Secondary | ICD-10-CM | POA: Insufficient documentation

## 2015-07-30 LAB — BASIC METABOLIC PANEL
ANION GAP: 13 (ref 5–15)
BUN: 10 mg/dL (ref 6–20)
CHLORIDE: 107 mmol/L (ref 101–111)
CO2: 22 mmol/L (ref 22–32)
Calcium: 9.9 mg/dL (ref 8.9–10.3)
Creatinine, Ser: 0.74 mg/dL (ref 0.44–1.00)
GFR calc non Af Amer: >60 mL/min (ref >60)
Glucose, Bld: 94 mg/dL (ref 65–99)
POTASSIUM: 3.9 mmol/L (ref 3.5–5.1)
Sodium: 142 mmol/L (ref 135–145)

## 2015-07-30 LAB — CBC
HEMATOCRIT: 42.5 % (ref 36.0–46.0)
HEMOGLOBIN: 13.6 g/dL (ref 12.0–15.0)
MCH: 28.7 pg (ref 26.0–34.0)
MCHC: 32 g/dL (ref 30.0–36.0)
MCV: 89.7 fL (ref 78.0–100.0)
Platelets: 179 10*3/uL (ref 150–400)
RBC: 4.74 MIL/uL (ref 3.87–5.11)
RDW: 13.2 % (ref 11.5–15.5)
WBC: 5.5 10*3/uL (ref 4.0–10.5)

## 2015-07-30 LAB — I-STAT TROPONIN, ED: Troponin i, poc: 0.05 ng/mL (ref 0.00–0.08)

## 2015-07-30 MED ORDER — METHYLPREDNISOLONE 4 MG PO TBPK: ORAL_TABLET | ORAL | Status: DC

## 2015-07-30 NOTE — Telephone Encounter (Signed)
Returned call. Pt seen by Dr. [name] last April. Notes return visit scheduled for May.  She reports since Saturday, she has had what she describes as shortness of breath and chest "heaviness". She isn't sure if it is worse today than on Saturday. She denies this being a pain. She denies symptoms of radiating arm, face, or neck pain. She describes the dyspnea as ongoing & worse w/ exertion. States her SOB feels similar to when she has had seasonal allergies in past, though she denies upper resp congestion or symptoms.  Given history & ambiguity of symptoms, recommended ED evaluation for patient. She will present to [facility] for w/u.   Trish, Inpatient Coordinator informed of pt status.

## 2015-07-30 NOTE — Discharge Instructions (Signed)
Please see your doctor in next 2 days for a recheck  Please obtain all of your results from medical records or have your doctors office obtain the results - share them with your doctor - you should be seen at your doctors office in the next 2 days. Call today to arrange your follow up. Take the medications as prescribed. Please review all of the medicines and only take them if you do not have an allergy to them. Please be aware that if you are taking birth control pills, taking other prescriptions, ESPECIALLY ANTIBIOTICS may make the birth control ineffective - if this is the case, either do not engage in sexual activity or use alternative methods of birth control such as condoms until you have finished the medicine and your family doctor says it is OK to restart them. If you are on a blood thinner such as COUMADIN, be aware that any other medicine that you take may cause the coumadin to either work too much, or not enough - you should have your coumadin level rechecked in next 7 days if this is the case.  ?  It is also a possibility that you have an allergic reaction to any of the medicines that you have been prescribed - Everybody reacts differently to medications and while MOST people have no trouble with most medicines, you may have a reaction such as nausea, vomiting, rash, swelling, shortness of breath. If this is the case, please stop taking the medicine immediately and contact your physician.  ?  You should return to the ER if you develop severe or worsening symptoms.

## 2015-07-30 NOTE — ED Provider Notes (Signed)
CSN: [id number]     Arrival date & time 07/30/15  1310 History   First MD Initiated Contact with Patient 07/30/15 2013     Chief Complaint  Patient presents with  . Shortness of Breath     (Consider location/radiation/quality/duration/timing/severity/associated sxs/prior Treatment) HPI Comments: The patient is a 55 year old female, she does have a history of arrhythmia not otherwise specified as well as mitral valve prolapse according to her report. She states that she has coronary disease but reports she has never had an abnormal stress test. She also reports that she has a history of seasonal allergies and often gets short of breath at certain times severe requiring a steroid dose. Over the last several days she has had some shortness of breath, she describes a heaviness on her chest, this comes on randomly and leaves randomly, is not exertional and in fact she has been walking on her treadmill over the week at least 35-40 minutes per day and never has any symptoms when she does that. She denies swelling in her legs, history of cancer, estrogen therapy or any other risk factors for pulmonary embolism. She does not smoke cigarettes. Her symptoms are gone at this time. She has no complaints at this time. She denies any sore throat, congestion, earache, blurred vision, coughing, swelling of the legs   Patient is a 55 y.o. female presenting with shortness of breath. The history is provided by the patient.  Shortness of Breath   Past Medical History  Diagnosis Date  . Allergy   . GERD (gastroesophageal reflux disease)   . MVP (mitral valve prolapse)   . Hyperlipidemia     per pt NO  . CAD (coronary artery disease)   . Allergic rhinitis   . HTN (hypertension)   . Lumbar back pain   . Anxiety   . Migraines   . Abnormal Pap smear   . Anemia   . History of shingles 1/15  . Hx of echocardiogram 2009    Showed normal systolic and diastolic function. she does have borderline mitral valve  prolapse.  [name] History of stress test 06/2011    Abnormal Myocadial perfusion scan demomstrating an attenuation defect in the anterior region of the myocardium, No ischemia or infaract/scar is seenin the remaining myocardium. Excercise capacity 12 METS.  [name] Post-operative nausea and vomiting   . 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 Cesarean section      twice  . Wrist surgery      right  . Hysteroscopy    . Cryoablation      times 2 for abn paps   Family History  Problem Relation Age of Onset  . Breast cancer Mother   . Heart disease Mother   . Diabetes Mother   . Hypertension Mother   . Prostate cancer Father   . Diabetes Sister   . Hypertension Sister   . Heart disease Brother   . Other Brother     CHF, had heart transplant   Social History  Substance Use Topics  . Smoking status: Never Smoker   . Smokeless tobacco: Never Used  . Alcohol Use: No   OB History    Gravida Para Term Preterm AB TAB SAB Ectopic Multiple Living   2 2 2       2      Review of Systems  Respiratory: Positive for shortness of breath.   All other systems reviewed  and are negative.     Allergies  Eggs or egg-derived products and Sulfa antibiotics  Home Medications   Prior to Admission medications   Medication Sig Start Date End Date Taking? Authorizing Provider  atenolol (TENORMIN) 50 MG tablet TAKE 1 TABLET (50 MG TOTAL) BY MOUTH DAILY. 07/24/15   Troy Sine, MD  levocetirizine (XYZAL) 5 MG tablet Take 1 tablet (5 mg total) by mouth every evening. 08/15/14   Hoyt Koch, MD  methylPREDNISolone (MEDROL DOSEPAK) 4 MG TBPK tablet Taper over 6 days 07/30/15   Noemi Chapel, MD  Multiple Vitamins-Calcium (ONE-A-DAY WOMENS PO) Take by mouth. Alive Womens One  A Day Chewables-Take 2 daily    Historical Provider, MD  pantoprazole (PROTONIX) 40 MG tablet Take 1 tablet (40 mg total) by mouth daily. Reflux  symptoms Patient not taking: Reported on 07/26/2015 02/02/14   Hoyt Koch, MD  simvastatin (ZOCOR) 40 MG tablet TAKE 1 TABLET (40 MG TOTAL) BY MOUTH DAILY. 09/18/14   Troy Sine, MD  Vitamin D, Ergocalciferol, (DRISDOL) 50000 UNITS CAPS capsule TAKE 1 CAPSULE BY MOUTH ONCE WEEKLY 02/08/15   Regina Eck, CNM   BP 147/70 mmHg  Pulse 57  Temp(Src) 98 F (36.7 C) (Oral)  Resp 16  SpO2 100%  LMP 03/28/2010 Physical Exam  Constitutional: She appears well-developed and well-nourished. No distress.  HENT:  Head: Normocephalic and atraumatic.  Mouth/Throat: Oropharynx is clear and moist. No oropharyngeal exudate.  Eyes: Conjunctivae and EOM are normal. Pupils are equal, round, and reactive to light. Right eye exhibits no discharge. Left eye exhibits no discharge. No scleral icterus.  Neck: Normal range of motion. Neck supple. No JVD present. No thyromegaly present.  Cardiovascular: Normal rate, regular rhythm, normal heart sounds and intact distal pulses.  Exam reveals no gallop and no friction rub.   No murmur heard. Occasional ectopy  Pulmonary/Chest: Effort normal and breath sounds normal. No respiratory distress. She has no wheezes. She has no rales.  Abdominal: Soft. Bowel sounds are normal. She exhibits no distension and no mass. There is no tenderness.  Musculoskeletal: Normal range of motion. She exhibits no edema or tenderness.  Lymphadenopathy:    She has no cervical adenopathy.  Neurological: She is alert. Coordination normal.  Skin: Skin is warm and dry. No rash noted. No erythema.  Psychiatric: She has a normal mood and affect. Her behavior is normal.  Nursing note and vitals reviewed.   ED Course  Procedures (including critical care time) Labs Review Labs Reviewed  BASIC METABOLIC PANEL  CBC  I-STAT [place], ED    Imaging Review Dg Chest 2 View  07/30/2015  CLINICAL DATA:  Shortness of breath for 2 days EXAM: CHEST  2 VIEW COMPARISON:  12/10/2011  FINDINGS: The heart size and mediastinal contours are within normal limits. Both lungs are clear. The visualized skeletal structures are unremarkable. IMPRESSION: No active cardiopulmonary disease. Electronically Signed   By: Rolm Baptise M.D.   On: 07/30/2015 14:50   I have personally reviewed and evaluated these images and lab results as part of my medical decision-making.   EKG Interpretation   Date/Time:  Monday July 30 2015 13:36:41 EDT Ventricular Rate:  74 PR Interval:  200 QRS Duration: 82 QT Interval:  400 QTC Calculation: 444 R Axis:   62 Text Interpretation:  Normal sinus rhythm Normal ECG No old tracing to  compare Confirmed by Garey Alleva  MD, [place] ([id number]) on 07/30/2015 8:14:19 PM      MDM  Final diagnoses:  Shortness of breath    The pt has occasional ectopy - there is no signs of dysfunction - with normal heart rates / sounds and lungs - ECG and labs normal - CXR normal.    VS normal Pt appears stable other than mild htn.  Steroid for possible increased allergies - has allergist and can f/u.  No RF for PE, low risk for ACS  Pt in agreement for d/c for     Noemi Chapel, MD 07/30/15 2035

## 2015-07-30 NOTE — ED Notes (Signed)
Pt reports having sob since Saturday. Denies swelling, denies recent cough. Airway intact at triage, ekg done.

## 2015-07-30 NOTE — Telephone Encounter (Signed)
Pt c/o Shortness Of Breath: STAT if SOB developed within the last 24 hours or pt is noticeably SOB on the phone  1. Are you currently SOB (can you hear that pt is SOB on the phone)? Yes   2. How long have you been experiencing SOB? Sat- 3/8  3. Are you SOB when sitting or when up moving around? Both- mostly moving around  4. Are you currently experiencing any other symptoms? no

## 2015-08-03 ENCOUNTER — Ambulatory Visit (INDEPENDENT_AMBULATORY_CARE_PROVIDER_SITE_OTHER): Payer: BC Managed Care – PPO | Admitting: Internal Medicine

## 2015-08-03 ENCOUNTER — Encounter: Payer: Self-pay | Admitting: Internal Medicine

## 2015-08-03 VITALS — BP 120/80 | HR 52 | Temp 98.0°F | Ht 63.25 in | Wt 161.0 lb

## 2015-08-03 DIAGNOSIS — R0600 Dyspnea, unspecified: Secondary | ICD-10-CM | POA: Diagnosis not present

## 2015-08-03 DIAGNOSIS — I10 Essential (primary) hypertension: Secondary | ICD-10-CM

## 2015-08-03 MED ORDER — ALBUTEROL SULFATE HFA 108 (90 BASE) MCG/ACT IN AERS: 2.0000 | INHALATION_SPRAY | Freq: Four times a day (QID) | RESPIRATORY_TRACT | Status: DC | PRN

## 2015-08-03 NOTE — Progress Notes (Signed)
Subjective:    Patient ID: Grace George, female    DOB: Mar 14, 1961, 55 y.o.   MRN: [id number]  HPI    Here to f/u recent ED visit for sob, has taken 2 doses of medrol and improving already, can seem to take deeper breaths, less high WOB. No HA, ST, fever, and Pt denies chest pain, increased sob or doe, wheezing, orthopnea, PND, increased LE swelling, palpitations, dizziness or syncope. Pt denies new neurological symptoms such as new headache, or facial or extremity weakness or numbness   Pt denies polydipsia, polyuria.  Denies worsening depressive symptoms, suicidal ideation, or panic, though has had significant situational anxiety in the past..  Recent labs neg for anemia: Lab Results  Component Value Date   WBC 5.5 07/30/2015   HGB 13.6 07/30/2015   HCT 42.5 07/30/2015   PLT 179 07/30/2015   GLUCOSE 94 07/30/2015   CHOL 133 08/02/2014   TRIG 60 08/02/2014   HDL 60 08/02/2014   LDLCALC 61 08/02/2014   ALT 14 08/02/2014   AST 20 08/02/2014   NA 142 07/30/2015   K 3.9 07/30/2015   CL 107 07/30/2015   CREATININE 0.74 07/30/2015   BUN 10 07/30/2015   CO2 22 07/30/2015   TSH 3.892 08/02/2014   CXR likewise neg for acute; exam o.w benign and given the benefit of doubt, was tx with medrol pak, as she has several times per Dr Lenna Gilford.  No hx of sarcoid, asthma, copd.   Never smoked. No recent increased stressors. Past Medical History  Diagnosis Date  . Allergy   . GERD (gastroesophageal reflux disease)   . MVP (mitral valve prolapse)   . Hyperlipidemia     per pt NO  . CAD (coronary artery disease)   . Allergic rhinitis   . HTN (hypertension)   . Lumbar back pain   . Anxiety   . Migraines   . Abnormal Pap smear   . Anemia   . History of shingles 1/15  . Hx of echocardiogram 2009    Showed normal systolic and diastolic function. she does have borderline mitral valve prolapse.  [name] History of stress test 06/2011    Abnormal Myocadial perfusion scan demomstrating an attenuation  defect in the anterior region of the myocardium, No ischemia or infaract/scar is seenin the remaining myocardium. Excercise capacity 12 METS.  [name] Post-operative nausea and vomiting   . 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 Cesarean section      twice  . Wrist surgery      right  . Hysteroscopy    . Cryoablation      times 2 for abn paps    reports that she has never smoked. She has never used smokeless tobacco. She reports that she does not drink alcohol or use illicit drugs. family history includes Breast cancer in her mother; Diabetes in her mother and sister; Heart disease in her brother and mother; Hypertension in her mother and sister; Other in her brother; Prostate cancer in her father. Allergies  Allergen Reactions  . Eggs Or Egg-Derived Products     Causes a rash, if she eats a lot!  . Sulfa Antibiotics     Rash & itching   Review of Systems  Constitutional: Negative for unusual diaphoresis or night sweats HENT: Negative for ear swelling or discharge Eyes: Negative for worsening visual haziness  Respiratory: Negative for choking and stridor.  Gastrointestinal: Negative for distension or worsening eructation Genitourinary: Negative for retention or change in urine volume.  Musculoskeletal: Negative for other MSK pain or swelling Skin: Negative for color change and worsening wound Neurological: Negative for tremors and numbness other than noted  Psychiatric/Behavioral: Negative for decreased concentration or agitation other than above       Objective:   Physical Exam BP 120/80 mmHg  Pulse 52  Temp(Src) 98 F (36.7 C) (Oral)  Ht 5' 3.25" (1.607 m)  Wt 161 lb (73.029 kg)  BMI 28.28 kg/m2  SpO2 99%  LMP 03/28/2010 VS noted,  Constitutional: Pt appears in no apparent distress HENT: Head: NCAT.  Right Ear: External ear normal.  Left Ear: External ear normal.  Eyes: . Pupils are  equal, round, and reactive to light. Conjunctivae and EOM are normal Neck: Normal range of motion. Neck supple.  Cardiovascular: Normal rate and regular rhythm.   Pulmonary/Chest: Effort normal and breath sounds without rales or wheezing.  Abd:  Soft, NT, ND, + BS Neurological: Pt is alert. Not confused , motor grossly intact Skin: Skin is warm. No rash, no LE edema Psychiatric: Pt behavior is normal. No agitation.     CLINICAL DATA: Shortness of breath for 2 days  EXAM: CHEST 2 VIEW  COMPARISON: 12/10/2011  FINDINGS: The heart size and mediastinal contours are within normal limits. Both lungs are clear. The visualized skeletal structures are unremarkable.  IMPRESSION: No active cardiopulmonary disease.   Electronically Signed  By: Rolm Baptise M.D.  On: 07/30/2015 14:50       Assessment & Plan:

## 2015-08-03 NOTE — Progress Notes (Signed)
Pre visit review using our clinic review tool, if applicable. No additional management support is needed unless otherwise documented below in the visit note. 

## 2015-08-03 NOTE — Assessment & Plan Note (Signed)
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

## 2015-08-03 NOTE — Assessment & Plan Note (Signed)
stable overall by history and exam, recent data reviewed with pt, and pt to continue medical treatment as before,  to f/u any worsening symptoms or concerns BP Readings from Last 3 Encounters:  08/03/15 120/80  07/30/15 147/70  07/26/15 124/68

## 2015-08-03 NOTE — Patient Instructions (Signed)
Please take all new medication as prescribed - the inhaler as needed  Please continue all other medications as before, including the medrol  Please try the inhaler, and see Dr Sharlet Salina if your symptoms get worse again after the medrol is finished  Please have the pharmacy call with any other refills you may need.  Please keep your appointments with your specialists as you may have planned

## 2015-08-06 ENCOUNTER — Ambulatory Visit: Payer: BC Managed Care – PPO | Admitting: Internal Medicine

## 2015-09-04 ENCOUNTER — Other Ambulatory Visit: Payer: Self-pay | Admitting: Internal Medicine

## 2015-09-07 LAB — CBC WITH DIFFERENTIAL/PLATELET
Basophils Absolute: 43 cells/uL (ref 0–200)
Basophils Relative: 1 %
EOS ABS: 86 {cells}/uL (ref 15–500)
Eosinophils Relative: 2 %
HEMATOCRIT: 40.6 % (ref 35.0–45.0)
Hemoglobin: 13.6 g/dL (ref 11.7–15.5)
Lymphocytes Relative: 33 %
Lymphs Abs: 1419 cells/uL (ref 850–3900)
MCH: 29.2 pg (ref 27.0–33.0)
MCHC: 33.5 g/dL (ref 32.0–36.0)
MCV: 87.3 fL (ref 80.0–100.0)
MPV: 12.3 fL (ref 7.5–12.5)
Monocytes Absolute: 387 cells/uL (ref 200–950)
Monocytes Relative: 9 %
NEUTROS ABS: 2365 {cells}/uL (ref 1500–7800)
Neutrophils Relative %: 55 %
PLATELETS: 208 10*3/uL (ref 140–400)
RBC: 4.65 MIL/uL (ref 3.80–5.10)
RDW: 13.9 % (ref 11.0–15.0)
WBC: 4.3 10*3/uL (ref 3.8–10.8)

## 2015-09-07 LAB — COMPREHENSIVE METABOLIC PANEL
ALBUMIN: 4.4 g/dL (ref 3.6–5.1)
ALT: 36 U/L — ABNORMAL HIGH (ref 6–29)
AST: 35 U/L (ref 10–35)
Alkaline Phosphatase: 109 U/L (ref 33–130)
BUN: 10 mg/dL (ref 7–25)
CALCIUM: 10 mg/dL (ref 8.6–10.4)
CHLORIDE: 105 mmol/L (ref 98–110)
CO2: 28 mmol/L (ref 20–31)
CREATININE: 0.73 mg/dL (ref 0.50–1.05)
Glucose, Bld: 85 mg/dL (ref 65–99)
POTASSIUM: 5.1 mmol/L (ref 3.5–5.3)
Sodium: 142 mmol/L (ref 135–146)
Total Bilirubin: 0.7 mg/dL (ref 0.2–1.2)
Total Protein: 7.2 g/dL (ref 6.1–8.1)

## 2015-09-07 LAB — LIPID PANEL
CHOL/HDL RATIO: 1.9 ratio (ref <=5.0)
CHOLESTEROL: 141 mg/dL (ref 125–200)
HDL: 74 mg/dL (ref >=46)
LDL Cholesterol: 58 mg/dL (ref <130)
TRIGLYCERIDES: 44 mg/dL (ref <150)
VLDL: 9 mg/dL (ref <30)

## 2015-09-07 LAB — TSH: TSH: 2.15 mIU/L

## 2015-09-14 ENCOUNTER — Encounter: Payer: Self-pay | Admitting: Cardiovascular Disease

## 2015-09-14 ENCOUNTER — Ambulatory Visit (INDEPENDENT_AMBULATORY_CARE_PROVIDER_SITE_OTHER): Payer: BC Managed Care – PPO | Admitting: Cardiovascular Disease

## 2015-09-14 VITALS — BP 142/74 | HR 55 | Ht 63.0 in | Wt 159.6 lb

## 2015-09-14 DIAGNOSIS — R0602 Shortness of breath: Secondary | ICD-10-CM | POA: Diagnosis not present

## 2015-09-14 DIAGNOSIS — I10 Essential (primary) hypertension: Secondary | ICD-10-CM

## 2015-09-14 DIAGNOSIS — E7800 Pure hypercholesterolemia, unspecified: Secondary | ICD-10-CM | POA: Diagnosis not present

## 2015-09-14 DIAGNOSIS — J302 Other seasonal allergic rhinitis: Secondary | ICD-10-CM

## 2015-09-14 DIAGNOSIS — R0600 Dyspnea, unspecified: Secondary | ICD-10-CM | POA: Diagnosis not present

## 2015-09-14 DIAGNOSIS — K219 Gastro-esophageal reflux disease without esophagitis: Secondary | ICD-10-CM

## 2015-09-14 NOTE — Patient Instructions (Signed)
Your physician has requested that you have an echocardiogram. Echocardiography is a painless test that uses sound waves to create images of your heart. It provides your doctor with information about the size and shape of your heart and how well your heart's chambers and valves are working. This procedure takes approximately one hour. There are no restrictions for this procedure.   Your physician wants you to follow-up in: 1 YEAR OR SOONER IF NEEDED You will receive a reminder letter in the mail two months in advance. If you don't receive a letter, please call our office to schedule the follow-up appointment. 

## 2015-09-16 ENCOUNTER — Encounter: Payer: Self-pay | Admitting: Cardiovascular Disease

## 2015-09-16 NOTE — Progress Notes (Signed)
Patient ID: Grace George, female   DOB: May 19, 1960, 55 y.o.   MRN: [id number]     HPI: Grace George is a 55 y.o. female who presents to the office for a 13 mon follow-up cardiology evaluation.  Grace George has a history of hypertension, hyperlipidemia, and palpitations and has been treated with beta blocker therapy.  An echo Doppler study in 2009, showed normal systolic and diastolic function and she did have borderline mitral valve prolapse.  Over the past year, she feels that she has done well.  She now sees Dr. Doug George in place of Dr. Lenna George who is retiring.  She has been on atenolol 50 mg daily for her palpitations.  She also has been on simvastatin 40 mg for hyperlipidemia.  Laboratory on 08/02/2014  revealed a hemoglobin of 13.1, hematocrit 39.9.  A competent metabolic panel was entirely normal.  She had normal renal function and LFTs.  Lipid studies were excellent with a total cholesterol 133, triglycerides 60, HDL 60, and LDL 61.  TSH was normal.  She recently went to the emergency room complaints of shortness of breath.  She denied any exertional symptoms.  Previously, she had been walking on a treadmill for at least 35-40 minutes without discomfort.  Was felt that possibly her symptoms were due to allergies.  She denies any exertional chest pain.  She is unaware of any recurrent palpitations.  She denies PND or orthopnea.  She is now on Xyzal for allergies and was given a prednisone Dosepak and she believes her shortness of breath has improved.  There is a history of GERD for which he takes Protonix.  She has continued to take simvastatin for hyperlipidemia.  She presents for evaluation.  Past Medical History  Diagnosis Date  . Allergy   . GERD (gastroesophageal reflux disease)   . MVP (mitral valve prolapse)   . Hyperlipidemia     per pt NO  . CAD (coronary artery disease)   . Allergic rhinitis   . HTN (hypertension)   . Lumbar back pain   . Anxiety   . Migraines   . Abnormal  Pap smear   . Anemia   . History of shingles 1/15  . Hx of echocardiogram 2009    Showed normal systolic and diastolic function. she does have borderline mitral valve prolapse.  [name] History of stress test 06/2011    Abnormal Myocadial perfusion scan demomstrating an attenuation defect in the anterior region of the myocardium, No ischemia or infaract/scar is seenin the remaining myocardium. Excercise capacity 12 METS.  [name] Post-operative nausea and vomiting   . 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 Cesarean section      twice  . Wrist surgery      right  . Hysteroscopy    . Cryoablation      times 2 for abn paps    Allergies  Allergen Reactions  . Eggs Or Egg-Derived Products     Causes a rash, if she eats a lot!  . Sulfa Antibiotics     Rash & itching    Current Outpatient Prescriptions  Medication Sig Dispense Refill  . albuterol (PROVENTIL HFA;VENTOLIN HFA) 108 (90 Base) MCG/ACT inhaler Inhale 2 puffs into the lungs every 6 (six) hours as needed for wheezing or shortness of breath. 1 Inhaler 2  . atenolol (TENORMIN) 50 MG tablet TAKE 1 TABLET (50 MG TOTAL) BY MOUTH DAILY. [place]  tablet 0  . levocetirizine (XYZAL) 5 MG tablet TAKE 1 TABLET (5 MG TOTAL) BY MOUTH EVERY EVENING. 90 tablet 3  . methylPREDNISolone (MEDROL DOSEPAK) 4 MG TBPK tablet Taper over 6 days 21 tablet 0  . Multiple Vitamins-Calcium (ONE-A-DAY WOMENS PO) Take by mouth. Alive Womens One  A Day Chewables-Take 2 daily    . pantoprazole (PROTONIX) 40 MG tablet Take 1 tablet (40 mg total) by mouth daily. Reflux symptoms 30 tablet 6  . simvastatin (ZOCOR) 40 MG tablet TAKE 1 TABLET (40 MG TOTAL) BY MOUTH DAILY. 90 tablet 3  . Vitamin D, Ergocalciferol, (DRISDOL) 50000 UNITS CAPS capsule TAKE 1 CAPSULE BY MOUTH ONCE WEEKLY 26 capsule 0   No current facility-administered medications for this visit.    Social History   Social History    . Marital Status: Married    Spouse Name: N/A  . Number of Children: 2  . Years of Education: N/A   Occupational History  . mortgage loan specialist    Social History Main Topics  . Smoking status: Never Smoker   . Smokeless tobacco: Never Used  . Alcohol Use: No  . Drug Use: No  . Sexual Activity:    Partners: Male    Birth Control/ Protection: Post-menopausal   Other Topics Concern  . Not on file   Social History Narrative   Socially, she is married and has 2 children, currently age 70 and 66.  She does not yet have grandchildren.  There is no tobacco or alcohol use.  Of note, her brother underwent  cardiac transplantation .   Family History  Problem Relation Age of Onset  . Breast cancer Mother   . Heart disease Mother   . Diabetes Mother   . Hypertension Mother   . Prostate cancer Father   . Diabetes Sister   . Hypertension Sister   . Heart disease Brother   . Other Brother     CHF, had heart transplant    ROS General: Negative; No fevers, chills, or night sweats;  HEENT: Negative; No changes in vision or hearing, sinus congestion, difficulty swallowing Pulmonary:  mild shortness of breath, felt to be due to allergies Cardiovascular: Negative; No chest pain, presyncope, syncope, palpitations GI: History of GERD. GU: Negative; No dysuria, hematuria, or difficulty voiding Immunologic: Seasonal allergies Musculoskeletal: Negative; no myalgias, joint pain, or weakness Hematologic/Oncology: Negative; no easy bruising, bleeding Endocrine: Negative; no heat/cold intolerance; no diabetes Neuro: Negative; no changes in balance, headaches Skin: Negative; No rashes or skin lesions Psychiatric: Negative; No behavioral problems, depression Sleep: Negative; No snoring, daytime sleepiness, hypersomnolence, bruxism, restless legs, hypnogognic hallucinations, no cataplexy Other comprehensive 14 point system review is negative.   PE BP 142/74 mmHg  Pulse 55  Ht 5' 3"  (1.6 m)  Wt 159 lb 9.6 oz (72.394 kg)  BMI 28.28 kg/m2  LMP 03/28/2010   Repeat blood pressure by me: 136/76  Wt Readings from Last 3 Encounters:  09/14/15 159 lb 9.6 oz (72.394 kg)  08/03/15 161 lb (73.029 kg)  07/26/15 161 lb (73.029 kg)   General: Alert, oriented, no distress.  Skin: normal turgor, no rashes HEENT: Normocephalic, atraumatic. Pupils round and reactive; sclera anicteric;no lid lag. Extraocular muscles intact;; no xanthelasmas. Nose without nasal septal hypertrophy Mouth/Parynx benign; Mallinpatti scale 2 Neck: No JVD, no carotid bruits; normal carotid upstroke Lungs: clear to ausculatation and percussion; no wheezing or rales Chest wall: no tenderness to palpitation Heart: RRR, s1 s2 normal; faint 1/6 systolic murmur;  no diastolic murmur, rub thrills or heaves Abdomen: soft, nontender; no hepatosplenomehaly, BS+; abdominal aorta nontender and not dilated by palpation. Back: no CVA tenderness Pulses 2+ Extremities: no clubbing cyanosis or edema, Homan's sign negative  Neurologic: grossly nonfocal; cranial nerves grossly normal. Psychologic: normal affect and mood.  ECG (independently read by me): Sinus bradycardia 55 bpm.  First degree AV block with a PR interval of 204 ms.  April 2016 ECG (independently read by me): Normal sinus rhythm.  Mild RV conduction delay with T-wave inversion V1 and V2, isolated PAC.  Borderline first-degree AV block with a PR interval at 22 ms.  April 2015 ECG (independently read by me): Sinus rhythm at 72 beats per minute with mild RV conduction delay.  She is previously noted T-wave inversion in leads V1 through V3.  She also has occasional PACs with different morphology to the P wave in these premature complexes.  QTc interval 47 ms.  PR interval 188 ms.  LABS:  BMP Latest Ref Rng 09/07/2015 07/30/2015 08/02/2014  Glucose 65 - 99 mg/dL 85 94 85  BUN 7 - 25 mg/dL _0 Creatinine 0.50 - 1.05 mg/dL 0.73 0.74 0.78  Sodium 135 - 146  mmol/L 142 142 142  Potassium 3.5 - 5.3 mmol/L 5.1 3.9 4.7  Chloride 98 - 110 mmol/L 105 107 106  CO2 20 - 31 mmol/L _1 Calcium 8.6 - 10.4 mg/dL 10.0 9.9 9.5     Hepatic Function Latest Ref Rng 09/07/2015 08/02/2014 08/10/2013  Total Protein 6.1 - 8.1 g/dL 7.2 6.9 7.2  Albumin 3.6 - 5.1 g/dL 4.4 4.3 4.5  AST 10 - 35 U/L 35 20 22  ALT 6 - 29 U/L 36(H) 14 13  Alk Phosphatase 33 - 130 U/L 109 82 86  Total Bilirubin 0.2 - 1.2 mg/dL 0.7 0.7 0.6     CBC Latest Ref Rng 09/07/2015 07/30/2015 07/26/2015  WBC 3.8 - 10.8 K/uL 4.3 5.5 -  Hemoglobin 11.7 - 15.5 g/dL 13.6 13.6 13.5  Hematocrit 35.0 - 45.0 % 40.6 42.5 -  Platelets 140 - 400 K/uL 208 179 -   Lab Results  Component Value Date   TSH 2.15 09/07/2015    BNP No results found for: PROBNP  Lipid Panel     Component Value Date/Time   CHOL 141 09/07/2015 0822   TRIG 44 09/07/2015 0822   HDL 74 09/07/2015 0822   CHOLHDL 1.9 09/07/2015 0822   VLDL 9 09/07/2015 0822   LDLCALC 58 09/07/2015 0822     RADIOLOGY: No results found.    ASSESSMENT AND PLAN: Grace George is a very pleasant 55 year old female who has a history of hypertension, hyperlipidemia, and palpitations.  She is on atenolol 50 mg daily and denies any awareness of recurrent arrhythmia or palpitations.  She had recently developed episodes of shortness of breath and was seen in the emergency room.  It was felt that her symptoms were not ischemic mediated and most likely were due to allergies.  She was given a Medrol Dosepak and her symptoms have resolved.  Her blood pressure today is stable on atenolol.  I reviewed her recent blood work.  Her lipid studies are excellent on simvastatin and she is tolerating this without myalgias.  Her GERD is controlled with pantoprazole 40 mg daily.  She has a prescription for albuterol to take as needed if wheezing occurs and takes Xyzal for allergies.  Her ECG today shows sinus rhythm , bradycardia 55 bpm  without ectopy.  She  states that she has not been exercising as regularly recently.  I again have recommended at least 5 days per week for 30 minutes at a time if at all possible.  She has not had an echo Doppler study since 2009.  I have suggested a follow-up echo Doppler evaluation to assess systolic and diastolic function.  I will contact her regarding the results.  If she is stable, I will see her in one year for reevaluation.  Time spent: 25 minutes  Troy Sine, MD, [facility]  09/16/2015 2:23 PM

## 2015-09-25 ENCOUNTER — Other Ambulatory Visit: Payer: Self-pay | Admitting: Cardiovascular Disease

## 2015-10-03 ENCOUNTER — Ambulatory Visit (HOSPITAL_COMMUNITY): Payer: BC Managed Care – PPO | Attending: Cardiology

## 2015-10-03 ENCOUNTER — Other Ambulatory Visit: Payer: Self-pay

## 2015-10-03 DIAGNOSIS — R0602 Shortness of breath: Secondary | ICD-10-CM | POA: Diagnosis not present

## 2015-10-03 DIAGNOSIS — I340 Nonrheumatic mitral (valve) insufficiency: Secondary | ICD-10-CM | POA: Insufficient documentation

## 2015-10-03 DIAGNOSIS — E785 Hyperlipidemia, unspecified: Secondary | ICD-10-CM | POA: Diagnosis not present

## 2015-10-03 DIAGNOSIS — I119 Hypertensive heart disease without heart failure: Secondary | ICD-10-CM | POA: Insufficient documentation

## 2015-10-03 LAB — ECHOCARDIOGRAM COMPLETE
AVLVOTPG: 3 mmHg
Ao-asc: 27 cm
E decel time: 342 msec
E/e' ratio: 6.46
FS: 30 % (ref 28–44)
IV/PV OW: 1.29
LA diam end sys: 30 mm
LA diam index: 1.7 cm/m2
LA vol index: 31.8 mL/m2
LA vol: 56 mL
LASIZE: 30 mm
LAVOLA4C: 46 mL
LDCA: 2.84 cm2
LV E/e' medial: 6.46
LV E/e'average: 6.46
LV PW d: 7.53 mm — AB (ref 0.6–1.1)
LV TDI E'LATERAL: 10.4
LV e' LATERAL: 10.4 cm/s
LV sys vol index: 20 mL/m2
LVDIAVOL: 81 mL (ref 46–106)
LVDIAVOLIN: 46 mL/m2
LVOT SV: 56 mL
LVOT VTI: 19.8 cm
LVOT peak vel: 82.2 cm/s
LVOTD: 19 mm
LVSYSVOL: 36 mL (ref 14–42)
MV Dec: 342
MV pk E vel: 67.2 m/s
MVPKAVEL: 46.3 m/s
RV LATERAL S' VELOCITY: 13.3 cm/s
RV sys press: 25 mmHg
Reg peak vel: 236 cm/s
Simpson's disk: 56
Stroke v: 45 ml
TDI e' medial: 10.7
TR max vel: 236 cm/s

## 2015-10-23 ENCOUNTER — Encounter: Payer: Self-pay | Admitting: Certified Nurse Midwife

## 2015-10-26 ENCOUNTER — Other Ambulatory Visit: Payer: Self-pay

## 2015-10-26 MED ORDER — ATENOLOL 50 MG PO TABS: 50.0000 mg | ORAL_TABLET | Freq: Every day | ORAL | Status: DC

## 2015-10-26 NOTE — Telephone Encounter (Signed)
Rx(s) sent to pharmacy electronically.  

## 2015-10-27 ENCOUNTER — Other Ambulatory Visit: Payer: Self-pay | Admitting: Cardiovascular Disease

## 2016-02-07 ENCOUNTER — Ambulatory Visit: Payer: BC Managed Care – PPO

## 2016-02-07 ENCOUNTER — Ambulatory Visit (INDEPENDENT_AMBULATORY_CARE_PROVIDER_SITE_OTHER): Payer: BC Managed Care – PPO | Admitting: General Practice

## 2016-02-07 DIAGNOSIS — Z23 Encounter for immunization: Secondary | ICD-10-CM

## 2016-04-17 ENCOUNTER — Telehealth: Payer: Self-pay | Admitting: Certified Nurse Midwife

## 2016-04-17 NOTE — Telephone Encounter (Signed)
Spoke with patient. Patient requesting refill on Vit D 50000 unit caps. Patient state she is having same symptoms as before when her vitamin D was low. Patient states she is feeling achy and tired. Patient states she took 50000 units each week for the last 2 weeks. RN advised patient of last Vitamin D results 07/26/15 and recommendations as seen below. Patient states she never started 1000iu daily that she never got a prescription. Advised patient vit D 1000 iu can be purchased OTC. After review of medications, advised patient the 50000 units of Vit D was from 01/2015. Patient would like to know what to do at this point? Another lab drawn or start the 1000iu of Vit D? Advised patient would review with Grace George, CNM And return call with recommendations.   Grace George, CNM please advise?    Notes Recorded by Grace George, CMA on 07/27/2015 at 7:55 AM Per protocol vit d 1000iu daily. Released to mychart & hep c is normal ------  Notes Recorded by Grace George, CNM on 07/27/2015 at 7:47 AM Notify patient vitamin D is low protocol Hep. C is negative

## 2016-04-17 NOTE — Telephone Encounter (Signed)
Patient is asking if she needs to start taking Vitamin D again or does she need to have this level checked again?

## 2016-04-17 NOTE — Telephone Encounter (Signed)
Spoke with patient. Advised of message as seen below from [place]. Patient is agreeable and verbalizes understanding.  Routing to provider for final review. Patient agreeable to disposition. Will close encounter.

## 2016-04-17 NOTE — Telephone Encounter (Signed)
Have patient start OTC Vit. D 3 1000 IU daily now and see if symptoms improve over next 14 days if not come for lab draw of Vit. D

## 2016-04-18 ENCOUNTER — Ambulatory Visit (INDEPENDENT_AMBULATORY_CARE_PROVIDER_SITE_OTHER): Payer: BC Managed Care – PPO | Admitting: Orthopaedic Surgery

## 2016-04-25 ENCOUNTER — Ambulatory Visit (INDEPENDENT_AMBULATORY_CARE_PROVIDER_SITE_OTHER): Payer: BC Managed Care – PPO | Admitting: Orthopaedic Surgery

## 2016-05-16 ENCOUNTER — Ambulatory Visit (INDEPENDENT_AMBULATORY_CARE_PROVIDER_SITE_OTHER): Payer: BC Managed Care – PPO | Admitting: Orthopaedic Surgery

## 2016-05-22 ENCOUNTER — Ambulatory Visit (INDEPENDENT_AMBULATORY_CARE_PROVIDER_SITE_OTHER): Payer: BC Managed Care – PPO | Admitting: Orthopaedic Surgery

## 2016-05-22 ENCOUNTER — Encounter (INDEPENDENT_AMBULATORY_CARE_PROVIDER_SITE_OTHER): Payer: Self-pay | Admitting: Orthopaedic Surgery

## 2016-05-22 ENCOUNTER — Ambulatory Visit (INDEPENDENT_AMBULATORY_CARE_PROVIDER_SITE_OTHER): Payer: Self-pay

## 2016-05-22 DIAGNOSIS — M25511 Pain in right shoulder: Secondary | ICD-10-CM

## 2016-05-22 DIAGNOSIS — G8929 Other chronic pain: Secondary | ICD-10-CM | POA: Diagnosis not present

## 2016-05-22 NOTE — Progress Notes (Signed)
Office Visit Note   Patient: Grace George           Date of Birth: [birth year]           MRN: [id number] Visit Date: 05/22/2016              Requested by: Hoyt Koch, MD [place], [place] [id number] PCP: Hoyt Koch, MD   Assessment & Plan: Visit Diagnoses:  1. 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 I recommend Advil as needed at home exercises. Follow-up with me as needed.  Follow-Up Instructions: Return if symptoms worsen or fail to improve.   Orders:  Orders Placed This Encounter  Procedures  . XR Shoulder Right   No orders of the defined types were placed in this encounter.     Procedures: No procedures performed   Clinical Data: No additional findings.   Subjective: Chief Complaint  Patient presents with  . Right Shoulder - Pain    Patient is 56 year old female with a one-month history of right shoulder pain that is worse with raising above her head. She denies any injuries or radicular symptoms. She is right-hand dominant. She has not taken medicines.    Review of Systems Complete review of systems is negative except for history of present illness  Objective: Vital Signs: LMP 03/28/2010   Physical Exam Well-developed and nourished no acute distress alert 3 nonlabored breathing normal judgments affect abdomen soft no lymphadenopathy Ortho Exam Exam of the right shoulder shows mild discomfort with empty can testing. No significant impingement signs. Relatively benign exam. Specialty Comments:  No specialty comments available.  Imaging: Xr Shoulder Right  Result Date: 05/22/2016 Negative    PMFS History: Patient Active Problem List   Diagnosis Date Noted  . Dyspnea 08/03/2015  . Situational anxiety 08/15/2014  . Routine general medical examination at a health care facility 02/02/2014  . PAC (premature atrial contraction) 08/15/2013  . Personal history of  colonic polyp - tubulovillous adenoma 08/15/2010  . Allergic rhinitis 01/05/2009  . MITRAL VALVE PROLAPSE 01/25/2008  . HYPERCHOLESTEROLEMIA 01/24/2008  . Essential hypertension 01/24/2008  . GERD 01/24/2008   Past Medical History:  Diagnosis Date  . Abnormal Pap smear   . Allergic rhinitis   . Allergy   . Anemia   . Anxiety   . CAD (coronary artery disease)   . GERD (gastroesophageal reflux disease)   . History of shingles 1/15  . History of stress test 06/2011   Abnormal Myocadial perfusion scan demomstrating an attenuation defect in the anterior region of the myocardium, No ischemia or infaract/scar is seenin the remaining myocardium. Excercise capacity 12 METS.  [name] HTN (hypertension)   . Hx of echocardiogram 2009   Showed normal systolic and diastolic function. she does have borderline mitral valve prolapse.  [name] Hyperlipidemia    per pt NO  . Lumbar back pain   . Migraines   . MVP (mitral valve prolapse)   . 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 Post-operative nausea and vomiting     Family History  Problem Relation Age of Onset  . Breast cancer Mother   . Heart disease Mother   . Diabetes Mother   . Hypertension Mother   . Prostate cancer Father   . Diabetes Sister   . Hypertension Sister   . Heart disease Brother   . Other Brother     CHF, had heart  transplant    Past Surgical History:  Procedure Laterality Date  . CESAREAN SECTION     twice  . CRYOABLATION     times 2 for abn paps  . HYSTEROSCOPY    . WRIST SURGERY     right   Social History   Occupational History  . mortgage loan specialist    Social History Main Topics  . Smoking status: Never Smoker  . Smokeless tobacco: Never Used  . Alcohol use No  . Drug use: No  . Sexual activity: Not Currently    Partners: Male    Birth control/ protection: Post-menopausal

## 2016-07-30 ENCOUNTER — Ambulatory Visit: Payer: BC Managed Care – PPO | Admitting: Certified Nurse Midwife

## 2016-08-13 ENCOUNTER — Encounter: Payer: Self-pay | Admitting: Certified Nurse Midwife

## 2016-08-13 ENCOUNTER — Ambulatory Visit (INDEPENDENT_AMBULATORY_CARE_PROVIDER_SITE_OTHER): Payer: BC Managed Care – PPO | Admitting: Certified Nurse Midwife

## 2016-08-13 VITALS — BP 120/60 | HR 70 | Resp 16 | Ht 63.25 in | Wt 161.0 lb

## 2016-08-13 DIAGNOSIS — Z0000 Encounter for general adult medical examination without abnormal findings: Secondary | ICD-10-CM | POA: Diagnosis not present

## 2016-08-13 DIAGNOSIS — Z124 Encounter for screening for malignant neoplasm of cervix: Secondary | ICD-10-CM | POA: Diagnosis not present

## 2016-08-13 DIAGNOSIS — E559 Vitamin D deficiency, unspecified: Secondary | ICD-10-CM

## 2016-08-13 DIAGNOSIS — Z01419 Encounter for gynecological examination (general) (routine) without abnormal findings: Secondary | ICD-10-CM | POA: Diagnosis not present

## 2016-08-13 LAB — COMPREHENSIVE METABOLIC PANEL
ALT: 26 U/L (ref 6–29)
AST: 31 U/L (ref 10–35)
Albumin: 4.4 g/dL (ref 3.6–5.1)
Alkaline Phosphatase: 92 U/L (ref 33–130)
BILIRUBIN TOTAL: 0.6 mg/dL (ref 0.2–1.2)
BUN: 11 mg/dL (ref 7–25)
CO2: 31 mmol/L (ref 20–31)
CREATININE: 0.65 mg/dL (ref 0.50–1.05)
Calcium: 9.7 mg/dL (ref 8.6–10.4)
Chloride: 104 mmol/L (ref 98–110)
GLUCOSE: 83 mg/dL (ref 65–99)
Potassium: 4.5 mmol/L (ref 3.5–5.3)
SODIUM: 140 mmol/L (ref 135–146)
Total Protein: 7.3 g/dL (ref 6.1–8.1)

## 2016-08-13 LAB — CBC
HEMATOCRIT: 43.2 % (ref 35.0–45.0)
HEMOGLOBIN: 13.8 g/dL (ref 11.7–15.5)
MCH: 29 pg (ref 27.0–33.0)
MCHC: 31.9 g/dL — ABNORMAL LOW (ref 32.0–36.0)
MCV: 90.8 fL (ref 80.0–100.0)
MPV: 12 fL (ref 7.5–12.5)
Platelets: 203 10*3/uL (ref 140–400)
RBC: 4.76 MIL/uL (ref 3.80–5.10)
RDW: 13.3 % (ref 11.0–15.0)
WBC: 4.7 10*3/uL (ref 3.8–10.8)

## 2016-08-13 LAB — VITAMIN B12: VITAMIN B 12: 357 pg/mL (ref 200–1100)

## 2016-08-13 NOTE — Patient Instructions (Signed)

## 2016-08-13 NOTE — Progress Notes (Signed)
56 y.o. [id number] Married  African American Fe here for annual exam. Menopausal no HRT. Saw Cardiologist in the past year for shortness of breath. All work up normal. Started on Zocor. Tolerating fine. Has annual follow up now. Still has fatigue, but no changes. Working Biochemist, clinical. Eats well, weight stable. Screening labs as needed. No other health issues today. Has twin grandchildren!  Patient's last menstrual period was 03/28/2010.          Sexually active: No.  The current method of family planning is post menopausal status.    Exercising: No.  exercise Smoker:  no  Health Maintenance: Pap:  07-20-14 History of Abnormal Pap: yes years ago cryo> 20 years ago MMG:  10-18-15 category c density birads 1:neg Self Breast exams: yes Colonoscopy:  2015 neg per patient, history of polyp per history BMD:   none TDaP:  2015 Shingles: no Pneumonia: no Hep C and HIV: Hep c neg 2017 Labs: as  needed   reports that she has never smoked. She has never used smokeless tobacco. She reports that she does not drink alcohol or use drugs.  Past Medical History:  Diagnosis Date  . Abnormal Pap smear   . Allergic rhinitis   . Allergy   . Anemia   . Anxiety   . CAD (coronary artery disease)   . GERD (gastroesophageal reflux disease)   . History of shingles 1/15  . History of stress test 06/2011   Abnormal Myocadial perfusion scan demomstrating an attenuation defect in the anterior region of the myocardium, No ischemia or infaract/scar is seenin the remaining myocardium. Excercise capacity 12 METS.  [name] HTN (hypertension)   . Hx of echocardiogram 2009   Showed normal systolic and diastolic function. she does have borderline mitral valve prolapse.  [name] Hyperlipidemia    per pt NO  . Lumbar back pain   . Migraines   . MVP (mitral valve prolapse)   . 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 Post-operative nausea and vomiting     Past Surgical History:  Procedure Laterality  Date  . CESAREAN SECTION     twice  . CRYOABLATION     times 2 for abn paps  . HYSTEROSCOPY    . WRIST SURGERY     right    Current Outpatient Prescriptions  Medication Sig Dispense Refill  . atenolol (TENORMIN) 50 MG tablet Take 1 tablet (50 mg total) by mouth daily. 90 tablet 3  . levocetirizine (XYZAL) 5 MG tablet TAKE 1 TABLET (5 MG TOTAL) BY MOUTH EVERY EVENING. 90 tablet 3  . Multiple Vitamins-Calcium (ONE-A-DAY WOMENS PO) Take by mouth. Alive Womens One  A Day Chewables-Take 2 daily    . simvastatin (ZOCOR) 40 MG tablet TAKE 1 TABLET (40 MG TOTAL) BY MOUTH DAILY. 90 tablet 3   No current facility-administered medications for this visit.     Family History  Problem Relation Age of Onset  . Breast cancer Mother   . Heart disease Mother   . Diabetes Mother   . Hypertension Mother   . Prostate cancer Father   . Diabetes Sister   . Hypertension Sister   . Heart disease Brother   . Other Brother     CHF, had heart transplant    ROS:  Pertinent items are noted in HPI.  Otherwise, a comprehensive ROS was negative.  Exam:   BP 120/60   Pulse 70   Resp 16   Ht 5' 3.25" (1.607 m)  Wt 161 lb (73 kg)   LMP 03/28/2010   BMI 28.29 kg/m  Height: 5' 3.25" (160.7 cm) Ht Readings from Last 3 Encounters:  08/13/16 5' 3.25" (1.607 m)  09/14/15 5\' 3"  (1.6 m)  08/03/15 5' 3.25" (1.607 m)    General appearance: alert, cooperative and appears stated age Head: Normocephalic, without obvious abnormality, atraumatic Neck: no adenopathy, supple, symmetrical, trachea midline and thyroid normal to inspection and palpation Lungs: clear to auscultation bilaterally Breasts: normal appearance, no masses or tenderness, No nipple retraction or dimpling, No nipple discharge or bleeding, No axillary or supraclavicular adenopathy Heart: regular rate and rhythm Abdomen: soft, non-tender; no masses,  no organomegaly Extremities: extremities normal, atraumatic, no cyanosis or edema Skin: Skin  color, texture, turgor normal. No rashes or lesions Lymph nodes: Cervical, supraclavicular, and axillary nodes normal. No abnormal inguinal nodes palpated Neurologic: Grossly normal   Pelvic: External genitalia:  no lesions              Urethra:  normal appearing urethra with no masses, tenderness or lesions              Bartholin's and Skene's: normal                 Vagina: normal appearing vagina with normal color and discharge, no lesions              Cervix: no cervical motion tenderness, no lesions, nulliparous appearance and stenotic cervical os              Pap taken: Yes.   Bimanual Exam:  Uterus:  normal size, contour, position, consistency, mobility, non-tender              Adnexa: normal adnexa and no mass, fullness, tenderness               Rectovaginal: Confirms               Anus:  normal sphincter tone, no lesions  Chaperone present: yes  A:  Well Woman with normal exam  Menopausal no HRT  Cholesterol and MVP management with cardiology  History of Vitamin D deficiency  Screening labs  Family history of Breast cancer, mother age 79  P:   Reviewed health and wellness pertinent to exam  Aware of need to advise if vaginal bleeding  Continue with Cardiology as indicated  Labs: Vit. D,CMP, CBC, Vit. B 12  Pap smear: yes   counseled on breast self exam, mammography screening, menopause, adequate intake of calcium and vitamin D, diet and exercise  return annually or prn  An After Visit Summary was printed and given to the patient.

## 2016-08-14 ENCOUNTER — Other Ambulatory Visit: Payer: Self-pay

## 2016-08-14 DIAGNOSIS — E559 Vitamin D deficiency, unspecified: Secondary | ICD-10-CM

## 2016-08-14 LAB — VITAMIN D 25 HYDROXY (VIT D DEFICIENCY, FRACTURES): Vit D, 25-Hydroxy: 24 ng/mL — ABNORMAL LOW (ref 30–100)

## 2016-08-14 MED ORDER — VITAMIN D (ERGOCALCIFEROL) 1.25 MG (50000 UNIT) PO CAPS: 50000.0000 [IU] | ORAL_CAPSULE | ORAL | 0 refills | Status: DC

## 2016-08-15 LAB — IPS PAP TEST WITH HPV

## 2016-08-16 NOTE — Progress Notes (Signed)
Encounter reviewed Alexandrya Chim, MD   

## 2016-08-19 ENCOUNTER — Encounter: Payer: Self-pay | Admitting: Certified Nurse Midwife

## 2016-08-19 ENCOUNTER — Telehealth: Payer: Self-pay | Admitting: Certified Nurse Midwife

## 2016-08-19 NOTE — Telephone Encounter (Signed)
Patient would like to speak with nurse about results in [place].

## 2016-08-19 NOTE — Telephone Encounter (Signed)
Spoke with patient. Patient states she was reviewing her medical history in MyChart and has history of breast cancer listed. Patient would like to confirm this is listed as family history of breast cancer not personal history. Reviewed patients medical history, no breast cancer  Listed. Advised patient family history -mother, breast cancer is listed. Patient states just wanted to confirm. Patient verbalizes understanding and is agreeable.  Routing to provider for final review. Patient is agreeable to disposition. Will close encounter.

## 2016-08-25 ENCOUNTER — Other Ambulatory Visit: Payer: Self-pay | Admitting: *Deleted

## 2016-08-25 MED ORDER — SIMVASTATIN 40 MG PO TABS: 40.0000 mg | ORAL_TABLET | Freq: Every day | ORAL | 0 refills | Status: DC

## 2016-08-25 NOTE — Telephone Encounter (Signed)
Rx has been sent to the pharmacy electronically. ° °

## 2016-08-28 ENCOUNTER — Telehealth: Payer: Self-pay | Admitting: Cardiovascular Disease

## 2016-08-28 DIAGNOSIS — I059 Rheumatic mitral valve disease, unspecified: Secondary | ICD-10-CM

## 2016-08-28 DIAGNOSIS — I10 Essential (primary) hypertension: Secondary | ICD-10-CM

## 2016-08-28 DIAGNOSIS — I491 Atrial premature depolarization: Secondary | ICD-10-CM

## 2016-08-28 DIAGNOSIS — E7800 Pure hypercholesterolemia, unspecified: Secondary | ICD-10-CM

## 2016-08-28 NOTE — Telephone Encounter (Signed)
Spoke w patient, who requested yearly lab orders to be sent to [facility]. Have written and submitted these for electronic order, pt aware she may go for fasting labwork any time prior to appt. She voiced understanding and thanks.

## 2016-08-28 NOTE — Telephone Encounter (Signed)
New Message    Please call regarding about her blood work , there is no order for it, she has appt for Tuesday

## 2016-08-28 NOTE — Telephone Encounter (Signed)
Returned call - goes to VM. Left msg advising that, indeed, no lab orders on file, however I'm happy to order yearly labwork for pt per Dr. Evette Georges protocol. Advised to call back to let us know her preferred lab draw site.

## 2016-08-29 ENCOUNTER — Other Ambulatory Visit: Payer: Self-pay | Admitting: Cardiovascular Disease

## 2016-08-29 LAB — CBC WITH DIFFERENTIAL/PLATELET
Basophils Absolute: 36 cells/uL (ref 0–200)
Basophils Relative: 1 %
EOS ABS: 72 {cells}/uL (ref 15–500)
Eosinophils Relative: 2 %
HEMATOCRIT: 41.2 % (ref 35.0–45.0)
HEMOGLOBIN: 13.4 g/dL (ref 11.7–15.5)
LYMPHS ABS: 1440 {cells}/uL (ref 850–3900)
Lymphocytes Relative: 40 %
MCH: 29.4 pg (ref 27.0–33.0)
MCHC: 32.5 g/dL (ref 32.0–36.0)
MCV: 90.4 fL (ref 80.0–100.0)
MONOS PCT: 7 %
MPV: 11.9 fL (ref 7.5–12.5)
Monocytes Absolute: 252 cells/uL (ref 200–950)
Neutro Abs: 1800 cells/uL (ref 1500–7800)
Neutrophils Relative %: 50 %
PLATELETS: 181 10*3/uL (ref 140–400)
RBC: 4.56 MIL/uL (ref 3.80–5.10)
RDW: 13.5 % (ref 11.0–15.0)
WBC: 3.6 10*3/uL — AB (ref 3.8–10.8)

## 2016-08-30 LAB — COMPREHENSIVE METABOLIC PANEL
ALBUMIN: 4.2 g/dL (ref 3.6–5.1)
ALK PHOS: 92 U/L (ref 33–130)
ALT: 25 U/L (ref 6–29)
AST: 25 U/L (ref 10–35)
BUN: 9 mg/dL (ref 7–25)
CHLORIDE: 106 mmol/L (ref 98–110)
CO2: 27 mmol/L (ref 20–31)
Calcium: 9.7 mg/dL (ref 8.6–10.4)
Creat: 0.66 mg/dL (ref 0.50–1.05)
Glucose, Bld: 88 mg/dL (ref 65–99)
POTASSIUM: 4.8 mmol/L (ref 3.5–5.3)
Sodium: 142 mmol/L (ref 135–146)
TOTAL PROTEIN: 7 g/dL (ref 6.1–8.1)
Total Bilirubin: 0.6 mg/dL (ref 0.2–1.2)

## 2016-08-30 LAB — LIPID PANEL
CHOLESTEROL: 139 mg/dL (ref <200)
HDL: 76 mg/dL (ref >50)
LDL Cholesterol: 53 mg/dL (ref <100)
TRIGLYCERIDES: 48 mg/dL (ref <150)
Total CHOL/HDL Ratio: 1.8 Ratio (ref <5.0)
VLDL: 10 mg/dL (ref <30)

## 2016-08-30 LAB — TSH: TSH: 3.72 mIU/L

## 2016-09-02 ENCOUNTER — Ambulatory Visit: Payer: BC Managed Care – PPO | Admitting: Cardiovascular Disease

## 2016-09-03 ENCOUNTER — Encounter: Payer: Self-pay | Admitting: Cardiovascular Disease

## 2016-09-03 ENCOUNTER — Ambulatory Visit (INDEPENDENT_AMBULATORY_CARE_PROVIDER_SITE_OTHER): Payer: BC Managed Care – PPO | Admitting: Cardiovascular Disease

## 2016-09-03 VITALS — BP 159/83 | HR 54 | Ht 63.0 in | Wt 160.0 lb

## 2016-09-03 DIAGNOSIS — I10 Essential (primary) hypertension: Secondary | ICD-10-CM | POA: Diagnosis not present

## 2016-09-03 DIAGNOSIS — E7800 Pure hypercholesterolemia, unspecified: Secondary | ICD-10-CM | POA: Diagnosis not present

## 2016-09-03 DIAGNOSIS — J302 Other seasonal allergic rhinitis: Secondary | ICD-10-CM | POA: Diagnosis not present

## 2016-09-03 DIAGNOSIS — I059 Rheumatic mitral valve disease, unspecified: Secondary | ICD-10-CM

## 2016-09-03 MED ORDER — SIMVASTATIN 40 MG PO TABS: 40.0000 mg | ORAL_TABLET | Freq: Every day | ORAL | 3 refills | Status: DC

## 2016-09-03 MED ORDER — ATENOLOL 50 MG PO TABS: 50.0000 mg | ORAL_TABLET | Freq: Every day | ORAL | 3 refills | Status: DC

## 2016-09-03 NOTE — Progress Notes (Signed)
Patient ID: Grace George, female   DOB: [birth year], 56 y.o.   MRN: [id number]     HPI: LORIN HAUCK is a 56 y.o. female who presents to the office for a 12 month follow-up cardiology evaluation.  Ms. Borrero has a history of hypertension, hyperlipidemia, and palpitations and has been treated with beta blocker therapy.  An echo Doppler study in 2009, showed normal systolic and diastolic function and she did have borderline mitral valve prolapse.  Over the past year, she feels that she has done well.  She now sees Dr. Doug Sou in place of Dr. Lenna Gilford who is retiring.  She has been on atenolol 50 mg daily for her palpitations.  She also has been on simvastatin 40 mg for hyperlipidemia.  Laboratory on 08/02/2014  revealed a hemoglobin of 13.1, hematocrit 39.9.  A competent metabolic panel was entirely normal.  She had normal renal function and LFTs.  Lipid studies were excellent with a total cholesterol 133, triglycerides 60, HDL 60, and LDL 61.  TSH was normal.  In 2017 she went to the emergency room complaints of shortness of breath.  She denied any exertional symptoms.  Previously, she had been walking on a treadmill for at least 35-40 minutes without discomfort. It was felt that possibly her symptoms were due to allergies.  She denies any exertional chest pain.  She is unaware of any recurrent palpitations.  She denies PND or orthopnea.  She is on Xyzal for allergies. There is a history of GERD for which he takes Protonix.  She has continued to take simvastatin for hyperlipidemia.   Since I last saw her, she underwent an echo Doppler study on 10/03/2015.  This revealed normal LV function, infection fraction of 55% without regional wall motion and her maladies.  There was grade 2 diastolic dysfunction.  There was flattening mitral valve closure without frank prolapse and trivial TR.  She had normal pulmonary pressures.  Over the past year, she has felt well.  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  Her blood pressure at home typically runs in the 614 systolically.  She has not been regularly exercising.  She denies weight gain.  She presents for evaluation.  Past Medical History:  Diagnosis Date  . Abnormal Pap smear   . Allergic rhinitis   . Allergy   . Anemia   . Anxiety   . CAD (coronary artery disease)   . GERD (gastroesophageal reflux disease)   . History of shingles 1/15  . History of stress test 06/2011   Abnormal Myocadial perfusion scan demomstrating an attenuation defect in the anterior region of the myocardium, No ischemia or infaract/scar is seenin the remaining myocardium. Excercise capacity 12 METS.  [name] HTN (hypertension)   . Hx of echocardiogram 2009   Showed normal systolic and diastolic function. she does have borderline mitral valve prolapse.  [name] Hyperlipidemia    per pt NO  . Lumbar back pain   . Migraines   . MVP (mitral valve prolapse)   . 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 Post-operative nausea and vomiting     Past Surgical History:  Procedure Laterality Date  . CESAREAN SECTION     twice  . CRYOABLATION     times 2 for abn paps  . HYSTEROSCOPY    . WRIST SURGERY     right    Allergies  Allergen Reactions  . Eggs Or Egg-Derived Products  Causes a rash, if she eats a lot!  . Sulfa Antibiotics     Rash & itching    Current Outpatient Prescriptions  Medication Sig Dispense Refill  . atenolol (TENORMIN) 50 MG tablet Take 1 tablet (50 mg total) by mouth daily. 90 tablet 3  . levocetirizine (XYZAL) 5 MG tablet TAKE 1 TABLET (5 MG TOTAL) BY MOUTH EVERY EVENING. 90 tablet 3  . Multiple Vitamins-Calcium (ONE-A-DAY WOMENS PO) Take by mouth. Alive Womens One  A Day Chewables-Take 2 daily    . simvastatin (ZOCOR) 40 MG tablet Take 1 tablet (40 mg total) by mouth daily at 6 PM. 90 tablet 3  . Vitamin D, Ergocalciferol, (DRISDOL) 50000 units CAPS capsule Take 1 capsule (50,000 Units total) by mouth  every 7 (seven) days. 12 capsule 0   No current facility-administered medications for this visit.     Social History   Social History  . Marital status: Married    Spouse name: N/A  . Number of children: 2  . Years of education: N/A   Occupational History  . mortgage loan specialist    Social History Main Topics  . Smoking status: Never Smoker  . Smokeless tobacco: Never Used  . Alcohol use No  . Drug use: No  . Sexual activity: Not Currently    Partners: Male    Birth control/ protection: Post-menopausal   Other Topics Concern  . Not on file   Social History Narrative  . No narrative on file   Updated social history:  she is married and has 2 children, currently age 56 and 35.  She has 2 grandchildren who are twins.  There is no tobacco or alcohol use.  Of note, her brother underwent cardiac transplantation .   Family History  Problem Relation Age of Onset  . Breast cancer Mother   . Heart disease Mother   . Diabetes Mother   . Hypertension Mother   . Prostate cancer Father   . Diabetes Sister   . Hypertension Sister   . Heart disease Brother   . Other Brother     CHF, had heart transplant    ROS General: Negative; No fevers, chills, or night sweats;  HEENT: Negative; No changes in vision or hearing, sinus congestion, difficulty swallowing Pulmonary:  mild shortness of breath, felt to be due to allergies Cardiovascular: Negative; No chest pain, presyncope, syncope, palpitations GI: History of GERD. GU: Negative; No dysuria, hematuria, or difficulty voiding Immunologic: Seasonal allergies Musculoskeletal: Negative; no myalgias, joint pain, or weakness Hematologic/Oncology: Negative; no easy bruising, bleeding Endocrine: Negative; no heat/cold intolerance; no diabetes Neuro: Negative; no changes in balance, headaches Skin: Negative; No rashes or skin lesions Psychiatric: Negative; No behavioral problems, depression Sleep: Negative; No snoring, daytime  sleepiness, hypersomnolence, bruxism, restless legs, hypnogognic hallucinations, no cataplexy Other comprehensive 14 point system review is negative.   PE BP (!) 159/83   Pulse (!) 54   Ht '5\' 3"'  (1.6 m)   Wt 160 lb (72.6 kg)   LMP 03/28/2010   BMI 28.34 kg/m    Repeat blood pressure by me: 125/70  Wt Readings from Last 3 Encounters:  09/03/16 160 lb (72.6 kg)  08/13/16 161 lb (73 kg)  09/14/15 159 lb 9.6 oz (72.4 kg)   General: Alert, oriented, no distress.  Skin: normal turgor, no rashes HEENT: Normocephalic, atraumatic. Pupils round and reactive; sclera anicteric;no lid lag. Extraocular muscles intact;; no xanthelasmas. Nose without nasal septal hypertrophy Mouth/Parynx benign; Mallinpatti scale 2 Neck:  No JVD, no carotid bruits; normal carotid upstroke Lungs: clear to ausculatation and percussion; no wheezing or rales Chest wall: no tenderness to palpitation Heart: RRR, s1 s2 normal; faint 1/6 systolic murmur; no diastolic murmur, rub thrills or heaves Abdomen: soft, nontender; no hepatosplenomehaly, BS+; abdominal aorta nontender and not dilated by palpation. Back: no CVA tenderness Pulses 2+ Extremities: no clubbing cyanosis or edema, Homan's sign negative  Neurologic: grossly nonfocal; cranial nerves grossly normal. Psychologic: normal affect and mood.  ECG (independently read by me): Sinus bradycardia 54 bpm..  Borderline First-degree AV block with a PR interval of 202 ms.  May 2018 ECG (independently read by me): Sinus bradycardia 55 bpm.  First degree AV block with a PR interval of 204 ms.  April 2016 ECG (independently read by me): Normal sinus rhythm.  Mild RV conduction delay with T-wave inversion V1 and V2, isolated PAC.  Borderline first-degree AV block with a PR interval at 22 ms.  April 2015 ECG (independently read by me): Sinus rhythm at 72 beats per minute with mild RV conduction delay.  She is previously noted T-wave inversion in leads V1 through V3.  She  also has occasional PACs with different morphology to the P wave in these premature complexes.  QTc interval 47 ms.  PR interval 188 ms.  LABS:  BMP Latest Ref Rng & Units 08/29/2016 08/13/2016 09/07/2015  Glucose 65 - 99 mg/dL 88 83 85  BUN 7 - 25 mg/dL '9 11 10  ' Creatinine 0.50 - 1.05 mg/dL 0.66 0.65 0.73  Sodium 135 - 146 mmol/L 142 140 142  Potassium 3.5 - 5.3 mmol/L 4.8 4.5 5.1  Chloride 98 - 110 mmol/L 106 104 105  CO2 20 - 31 mmol/L '27 31 28  ' Calcium 8.6 - 10.4 mg/dL 9.7 9.7 10.0     Hepatic Function Latest Ref Rng & Units 08/29/2016 08/13/2016 09/07/2015  Total Protein 6.1 - 8.1 g/dL 7.0 7.3 7.2  Albumin 3.6 - 5.1 g/dL 4.2 4.4 4.4  AST 10 - 35 U/L 25 31 35  ALT 6 - 29 U/L 25 26 36(H)  Alk Phosphatase 33 - 130 U/L 92 92 109  Total Bilirubin 0.2 - 1.2 mg/dL 0.6 0.6 0.7     CBC Latest Ref Rng & Units 08/29/2016 08/13/2016 09/07/2015  WBC 3.8 - 10.8 K/uL 3.6(L) 4.7 4.3  Hemoglobin 11.7 - 15.5 g/dL 13.4 13.8 13.6  Hematocrit 35.0 - 45.0 % 41.2 43.2 40.6  Platelets 140 - 400 K/uL 181 203 208   Lab Results  Component Value Date   TSH 3.72 08/29/2016    BNP No results found for: PROBNP  Lipid Panel     Component Value Date/Time   CHOL 139 08/29/2016 1324   TRIG 48 08/29/2016 1324   HDL 76 08/29/2016 1324   CHOLHDL 1.8 08/29/2016 1324   VLDL 10 08/29/2016 1324   LDLCALC 53 08/29/2016 1324     RADIOLOGY: No results found.  IMPRESSION:  1. Essential hypertension   2. Mitral valve disease   3. HYPERCHOLESTEROLEMIA   4. Other seasonal allergic rhinitis     ASSESSMENT AND PLAN: Ms. Mishti Swanton is a very pleasant 56 year old female who has a history of hypertension, hyperlipidemia, and palpitations.  Over the past year, her palpitations have stabilized on her current regimen of 50 mg daily.  She has a history of hyperlipidemia and continues to be on simvastatin 40 mg.  Most recent lipid studies are excellent with an LDL cholesterol 53.  2017.  She  developed episodes of  shortness of breath and was seen in the emergency room.  It was felt that her symptoms were not ischemic mediated and most likely were due to allergies.  She was given a Medrol Dosepak and her symptoms resolved.  Her blood pressure initially was elevated, but on repeat by me was excellent at 125/70.  I reviewed her last echo Doppler study from 10/03/2015, which continue to show normal systolic function although she has grade 2 diastolic dysfunction.  There was flap mitral valve closure without definitive prolapse and trivial MR.  Her allergies are controlled with Xyzal.  She continues to take vitamin D with a history of prior vitamin D insufficiency.  She has not been exercising.  I discussed the importance of exercising at least 5 days a week for 30 minutes at a time.  I will see her in one year for reevaluation or sooner if problems arise. Time spent: 25 minutes  Troy Sine, MD, [facility]  09/03/2016 5:30 PM

## 2016-09-03 NOTE — Patient Instructions (Signed)
Your physician wants you to follow-up in: 1 year or sooner if needed. You will receive a reminder letter in the mail two months in advance. If you don't receive a letter, please call our office to schedule the follow-up appointment.   If you need a refill on your cardiac medications before your next appointment, please call your pharmacy.   

## 2016-09-09 ENCOUNTER — Other Ambulatory Visit: Payer: Self-pay | Admitting: Internal Medicine

## 2016-10-06 ENCOUNTER — Telehealth: Payer: Self-pay | Admitting: *Deleted

## 2016-10-06 NOTE — Telephone Encounter (Signed)
Left msg on triage stating she is trying to get refill on her pantoprazole that was originally rx by Dr. Lenna Gilford, which she no longer see. Per chart pt has not seen dr. Sharlet Salina since 2016 will need appt for renewal.../lmb

## 2016-10-07 NOTE — Telephone Encounter (Signed)
Appointment made with Grace George on Monday (10/13/2016). Pt states that she is out of this medication now.

## 2016-10-13 ENCOUNTER — Ambulatory Visit (INDEPENDENT_AMBULATORY_CARE_PROVIDER_SITE_OTHER): Payer: BC Managed Care – PPO | Admitting: Nurse Practitioner

## 2016-10-13 ENCOUNTER — Encounter: Payer: Self-pay | Admitting: Nurse Practitioner

## 2016-10-13 VITALS — BP 126/70 | HR 51 | Temp 98.2°F | Ht 63.0 in | Wt 160.0 lb

## 2016-10-13 DIAGNOSIS — R0981 Nasal congestion: Secondary | ICD-10-CM | POA: Diagnosis not present

## 2016-10-13 DIAGNOSIS — J309 Allergic rhinitis, unspecified: Secondary | ICD-10-CM

## 2016-10-13 DIAGNOSIS — K219 Gastro-esophageal reflux disease without esophagitis: Secondary | ICD-10-CM | POA: Diagnosis not present

## 2016-10-13 MED ORDER — SALINE SPRAY 0.65 % NA SOLN: 1.0000 | NASAL | 0 refills | Status: DC | PRN

## 2016-10-13 MED ORDER — DM-GUAIFENESIN ER 30-600 MG PO TB12: 1.0000 | ORAL_TABLET | Freq: Two times a day (BID) | ORAL | 0 refills | Status: DC | PRN

## 2016-10-13 MED ORDER — MOMETASONE FUROATE 50 MCG/ACT NA SUSP: 2.0000 | Freq: Every day | NASAL | 5 refills | Status: DC

## 2016-10-13 MED ORDER — AZITHROMYCIN 250 MG PO TABS: 250.0000 mg | ORAL_TABLET | Freq: Every day | ORAL | 0 refills | Status: DC

## 2016-10-13 MED ORDER — PANTOPRAZOLE SODIUM 20 MG PO TBEC: 20.0000 mg | DELAYED_RELEASE_TABLET | Freq: Every day | ORAL | 1 refills | Status: DC

## 2016-10-13 NOTE — Patient Instructions (Signed)
URI Instructions: Nasonex and Afrin use: apply 1spray of afrin in each nare, wait 57mins, then apply 2sprays of nasonex in each nare. Use both nasal spray consecutively x 3days, then nasonex only for at least 14days.  Encourage adequate oral hydration.  Start azithromycin if no improvement in 3-4days.

## 2016-10-13 NOTE — Progress Notes (Signed)
Subjective:  Patient ID: Grace George, female    DOB: [birth year]  Age: 56 y.o. MRN: [id number]  CC: Follow-up (medication follow up/protonix consult and sinus med?)   Sinus Problem  This is a new problem. The current episode started 1 to 4 weeks ago. The problem has been waxing and waning since onset. There has been no fever. Associated symptoms include congestion, coughing, sinus pressure and sneezing. Pertinent negatives include no chills, diaphoresis, ear pain, headaches, hoarse voice, neck pain, shortness of breath, sore throat or swollen glands. Treatments tried: xyzal. The treatment provided no relief.  Gastroesophageal Reflux  She complains of coughing and heartburn. She reports no abdominal pain, no belching, no chest pain, no dysphagia, no early satiety, no globus sensation, no hoarse voice, no sore throat, no stridor, no water brash or no wheezing. This is a recurrent problem. The current episode started 1 to 4 weeks ago. The problem occurs frequently. The problem has been waxing and waning. The heartburn is located in the substernum. The heartburn does not wake Grace George from sleep. The heartburn does not limit Grace George activity. The heartburn doesn't change with position. Pertinent negatives include no anemia, fatigue, melena, muscle weakness, orthopnea or weight loss. She has tried nothing for the symptoms.    GERD: Needs Protonix refill.  Outpatient Medications Prior to Visit  Medication Sig Dispense Refill  . atenolol (TENORMIN) 50 MG tablet Take 1 tablet (50 mg total) by mouth daily. 90 tablet 3  . levocetirizine (XYZAL) 5 MG tablet TAKE 1 TABLET (5 MG TOTAL) BY MOUTH EVERY EVENING. 90 tablet 0  . Multiple Vitamins-Calcium (ONE-A-DAY WOMENS PO) Take by mouth. Alive Womens One  A Day Chewables-Take 2 daily    . simvastatin (ZOCOR) 40 MG tablet Take 1 tablet (40 mg total) by mouth daily at 6 PM. 90 tablet 3  . Vitamin D, Ergocalciferol, (DRISDOL) 50000 units CAPS capsule Take 1 capsule  (50,000 Units total) by mouth every 7 (seven) days. 12 capsule 0   No facility-administered medications prior to visit.     ROS See HPI  Objective:  BP 126/70   Pulse (!) 51   Temp 98.2 F (36.8 C)   Ht 5\' 3"  (1.6 m)   Wt 160 lb (72.6 kg)   LMP 03/28/2010   SpO2 100%   BMI 28.34 kg/m   BP Readings from Last 3 Encounters:  10/13/16 126/70  09/03/16 (!) 159/83  08/13/16 120/60    Wt Readings from Last 3 Encounters:  10/13/16 160 lb (72.6 kg)  09/03/16 160 lb (72.6 kg)  08/13/16 161 lb (73 kg)    Physical Exam  Constitutional: She is oriented to person, place, and time.  HENT:  Right Ear: Tympanic membrane, external ear and ear canal normal.  Left Ear: Tympanic membrane, external ear and ear canal normal.  Nose: Mucosal edema and rhinorrhea present. Right sinus exhibits no frontal sinus tenderness. Left sinus exhibits no maxillary sinus tenderness and no frontal sinus tenderness.  Mouth/Throat: Uvula is midline. No trismus in the jaw. Posterior oropharyngeal erythema present. No oropharyngeal exudate.  Eyes: No scleral icterus.  Neck: Normal range of motion. Neck supple.  Cardiovascular: Normal rate and normal heart sounds.   Pulmonary/Chest: Effort normal and breath sounds normal.  Abdominal: She exhibits no distension. There is no tenderness.  Musculoskeletal: She exhibits no edema.  Lymphadenopathy:    She has no cervical adenopathy.  Neurological: She is alert and oriented to person, place, and time.  Vitals reviewed.  Lab Results  Component Value Date   WBC 3.6 (L) 08/29/2016   HGB 13.4 08/29/2016   HCT 41.2 08/29/2016   PLT 181 08/29/2016   GLUCOSE 88 08/29/2016   CHOL 139 08/29/2016   TRIG 48 08/29/2016   HDL 76 08/29/2016   LDLCALC 53 08/29/2016   ALT 25 08/29/2016   AST 25 08/29/2016   NA 142 08/29/2016   K 4.8 08/29/2016   CL 106 08/29/2016   CREATININE 0.66 08/29/2016   BUN 9 08/29/2016   CO2 27 08/29/2016   TSH 3.72 08/29/2016    Dg  Chest 2 View  Result Date: 07/30/2015 CLINICAL DATA:  Shortness of breath for 2 days EXAM: CHEST  2 VIEW COMPARISON:  12/10/2011 FINDINGS: The heart size and mediastinal contours are within normal limits. Both lungs are clear. The visualized skeletal structures are unremarkable. IMPRESSION: No active cardiopulmonary disease. Electronically Signed   By: Rolm Baptise M.D.   On: 07/30/2015 14:50    Assessment & Plan:   Grace George was seen today for follow-up.  Diagnoses and all orders for this visit:  Gastroesophageal reflux disease without esophagitis -     pantoprazole (PROTONIX) 20 MG tablet; Take 1 tablet (20 mg total) by mouth daily.  Allergic rhinitis, unspecified seasonality, unspecified trigger -     mometasone (NASONEX) 50 MCG/ACT nasal spray; Place 2 sprays into the nose daily. -     dextromethorphan-guaiFENesin (MUCINEX DM) 30-600 MG 12hr tablet; Take 1 tablet by mouth 2 (two) times daily as needed for cough. -     sodium chloride (OCEAN) 0.65 % SOLN nasal spray; Place 1 spray into both nostrils as needed for congestion.  Nasal sinus congestion -     mometasone (NASONEX) 50 MCG/ACT nasal spray; Place 2 sprays into the nose daily. -     dextromethorphan-guaiFENesin (MUCINEX DM) 30-600 MG 12hr tablet; Take 1 tablet by mouth 2 (two) times daily as needed for cough. -     sodium chloride (OCEAN) 0.65 % SOLN nasal spray; Place 1 spray into both nostrils as needed for congestion. -     azithromycin (ZITHROMAX Z-PAK) 250 MG tablet; Take 1 tablet (250 mg total) by mouth daily. Take 2tabs on first day, then 1tab once a day till complete   I am having Grace George start on pantoprazole, mometasone, dextromethorphan-guaiFENesin, sodium chloride, and azithromycin. I am also having Grace George maintain Grace George Multiple Vitamins-Calcium (ONE-A-DAY WOMENS PO), Vitamin D (Ergocalciferol), atenolol, simvastatin, and levocetirizine.  Meds ordered this encounter  Medications  . pantoprazole (PROTONIX) 20 MG tablet     Sig: Take 1 tablet (20 mg total) by mouth daily.    Dispense:  90 tablet    Refill:  1    Order Specific Question:   Supervising Provider    Answer:   Cassandria Anger [[id number]]  . mometasone (NASONEX) 50 MCG/ACT nasal spray    Sig: Place 2 sprays into the nose daily.    Dispense:  17 g    Refill:  5    Order Specific Question:   Supervising Provider    Answer:   Cassandria Anger [[id number]]  . dextromethorphan-guaiFENesin (MUCINEX DM) 30-600 MG 12hr tablet    Sig: Take 1 tablet by mouth 2 (two) times daily as needed for cough.    Dispense:  14 tablet    Refill:  0    Order Specific Question:   Supervising Provider    Answer:   Cassandria Anger [[id number]]  . sodium chloride (OCEAN)  0.65 % SOLN nasal spray    Sig: Place 1 spray into both nostrils as needed for congestion.    Dispense:  15 mL    Refill:  0    Order Specific Question:   Supervising Provider    Answer:   Cassandria Anger [[id number]]  . azithromycin (ZITHROMAX Z-PAK) 250 MG tablet    Sig: Take 1 tablet (250 mg total) by mouth daily. Take 2tabs on first day, then 1tab once a day till complete    Dispense:  6 tablet    Refill:  0    Order Specific Question:   Supervising Provider    Answer:   Cassandria Anger [[id number]]    Follow-up: Return if symptoms worsen or fail to improve.  Wilfred Lacy, NP

## 2016-11-04 ENCOUNTER — Encounter: Payer: Self-pay | Admitting: Certified Nurse Midwife

## 2016-11-13 ENCOUNTER — Other Ambulatory Visit: Payer: BC Managed Care – PPO

## 2016-11-20 ENCOUNTER — Other Ambulatory Visit: Payer: BC Managed Care – PPO

## 2016-11-21 ENCOUNTER — Other Ambulatory Visit: Payer: Self-pay | Admitting: Certified Nurse Midwife

## 2016-11-21 ENCOUNTER — Other Ambulatory Visit (INDEPENDENT_AMBULATORY_CARE_PROVIDER_SITE_OTHER): Payer: BC Managed Care – PPO

## 2016-11-21 DIAGNOSIS — E559 Vitamin D deficiency, unspecified: Secondary | ICD-10-CM

## 2016-11-22 LAB — VITAMIN D 25 HYDROXY (VIT D DEFICIENCY, FRACTURES): VIT D 25 HYDROXY: 36 ng/mL (ref 30.0–100.0)

## 2016-12-08 ENCOUNTER — Other Ambulatory Visit: Payer: Self-pay | Admitting: Internal Medicine

## 2016-12-23 ENCOUNTER — Encounter: Payer: Self-pay | Admitting: Nurse Practitioner

## 2016-12-23 ENCOUNTER — Ambulatory Visit (INDEPENDENT_AMBULATORY_CARE_PROVIDER_SITE_OTHER): Payer: BC Managed Care – PPO | Admitting: Nurse Practitioner

## 2016-12-23 DIAGNOSIS — J309 Allergic rhinitis, unspecified: Secondary | ICD-10-CM | POA: Diagnosis not present

## 2016-12-23 DIAGNOSIS — J9801 Acute bronchospasm: Secondary | ICD-10-CM

## 2016-12-23 MED ORDER — VITAMIN D 1000 UNITS PO TABS: 1000.0000 [IU] | ORAL_TABLET | Freq: Every day | ORAL | Status: DC

## 2016-12-23 MED ORDER — MOMETASONE FUROATE 50 MCG/ACT NA SUSP: 2.0000 | Freq: Every day | NASAL | 11 refills | Status: DC

## 2016-12-23 MED ORDER — LEVOCETIRIZINE DIHYDROCHLORIDE 5 MG PO TABS: 5.0000 mg | ORAL_TABLET | Freq: Every evening | ORAL | 3 refills | Status: DC

## 2016-12-23 MED ORDER — ALBUTEROL SULFATE HFA 108 (90 BASE) MCG/ACT IN AERS: 1.0000 | INHALATION_SPRAY | Freq: Four times a day (QID) | RESPIRATORY_TRACT | 0 refills | Status: DC | PRN

## 2016-12-23 NOTE — Progress Notes (Signed)
Subjective:  Patient ID: Grace George, female    DOB: 1960/12/05  Age: 56 y.o. MRN: [id number]  CC: Cough (coughing,tightness in chest getting better. med refills?)    Cough  This is a recurrent problem. The current episode started more than 1 month ago. The problem has been waxing and waning. The cough is non-productive. Associated symptoms include nasal congestion, postnasal drip and wheezing. Pertinent negatives include no chest pain, chills, ear congestion, ear pain, fever, headaches, heartburn, hemoptysis, rhinorrhea, sore throat or sweats. The symptoms are aggravated by fumes. She has tried OTC cough suppressant (and antihistamine) for the symptoms. Her past medical history is significant for bronchitis and environmental allergies.    Outpatient Medications Prior to Visit  Medication Sig Dispense Refill  . atenolol (TENORMIN) 50 MG tablet Take 1 tablet (50 mg total) by mouth daily. 90 tablet 3  . pantoprazole (PROTONIX) 20 MG tablet Take 1 tablet (20 mg total) by mouth daily. 90 tablet 1  . simvastatin (ZOCOR) 40 MG tablet Take 1 tablet (40 mg total) by mouth daily at 6 PM. 90 tablet 3  . levocetirizine (XYZAL) 5 MG tablet Take 1 tablet (5 mg total) by mouth every evening. Overdue for annual appt w/Dr. Sharlet Salina must see provider for future refills 30 tablet 0  . Multiple Vitamins-Calcium (ONE-A-DAY WOMENS PO) Take by mouth. Alive Womens One  A Day Chewables-Take 2 daily    . azithromycin (ZITHROMAX Z-PAK) 250 MG tablet Take 1 tablet (250 mg total) by mouth daily. Take 2tabs on first day, then 1tab once a day till complete (Patient not taking: Reported on 12/23/2016) 6 tablet 0  . dextromethorphan-guaiFENesin (MUCINEX DM) 30-600 MG 12hr tablet Take 1 tablet by mouth 2 (two) times daily as needed for cough. (Patient not taking: Reported on 12/23/2016) 14 tablet 0  . mometasone (NASONEX) 50 MCG/ACT nasal spray Place 2 sprays into the nose daily. (Patient not taking: Reported on 12/23/2016) 17  g 5  . sodium chloride (OCEAN) 0.65 % SOLN nasal spray Place 1 spray into both nostrils as needed for congestion. (Patient not taking: Reported on 12/23/2016) 15 mL 0  . Vitamin D, Ergocalciferol, (DRISDOL) 50000 units CAPS capsule Take 1 capsule (50,000 Units total) by mouth every 7 (seven) days. (Patient not taking: Reported on 12/23/2016) 12 capsule 0   No facility-administered medications prior to visit.     ROS See HPI  Objective:  BP 126/74   Pulse 61   Temp 97.7 F (36.5 C)   Ht 5\' 3"  (1.6 m)   Wt 160 lb (72.6 kg)   LMP 03/28/2010   SpO2 99%   BMI 28.34 kg/m   BP Readings from Last 3 Encounters:  12/23/16 126/74  10/13/16 126/70  09/03/16 (!) 159/83    Wt Readings from Last 3 Encounters:  12/23/16 160 lb (72.6 kg)  10/13/16 160 lb (72.6 kg)  09/03/16 160 lb (72.6 kg)    Physical Exam  Constitutional: She is oriented to person, place, and time. No distress.  HENT:  Right Ear: Tympanic membrane, external ear and ear canal normal.  Left Ear: Tympanic membrane, external ear and ear canal normal.  Nose: Mucosal edema present. No rhinorrhea. Right sinus exhibits no maxillary sinus tenderness and no frontal sinus tenderness. Left sinus exhibits no maxillary sinus tenderness and no frontal sinus tenderness.  Mouth/Throat: Uvula is midline. No trismus in the jaw. Posterior oropharyngeal erythema present. No oropharyngeal exudate.  Eyes: No scleral icterus.  Neck: Normal range of motion.  Neck supple.  Cardiovascular: Normal rate, regular rhythm and normal heart sounds.   Pulmonary/Chest: Effort normal and breath sounds normal.  Musculoskeletal: She exhibits no edema.  Lymphadenopathy:    She has no cervical adenopathy.  Neurological: She is alert and oriented to person, place, and time.  Vitals reviewed.   Lab Results  Component Value Date   WBC 3.6 (L) 08/29/2016   HGB 13.4 08/29/2016   HCT 41.2 08/29/2016   PLT 181 08/29/2016   GLUCOSE 88 08/29/2016   CHOL 139  08/29/2016   TRIG 48 08/29/2016   HDL 76 08/29/2016   LDLCALC 53 08/29/2016   ALT 25 08/29/2016   AST 25 08/29/2016   NA 142 08/29/2016   K 4.8 08/29/2016   CL 106 08/29/2016   CREATININE 0.66 08/29/2016   BUN 9 08/29/2016   CO2 27 08/29/2016   TSH 3.72 08/29/2016    Dg Chest 2 View  Result Date: 07/30/2015 CLINICAL DATA:  Shortness of breath for 2 days EXAM: CHEST  2 VIEW COMPARISON:  12/10/2011 FINDINGS: The heart size and mediastinal contours are within normal limits. Both lungs are clear. The visualized skeletal structures are unremarkable. IMPRESSION: No active cardiopulmonary disease. Electronically Signed   By: Rolm Baptise M.D.   On: 07/30/2015 14:50    Assessment & Plan:   Grace George was seen today for cough.  Diagnoses and all orders for this visit:  Allergic rhinitis, unspecified seasonality, unspecified trigger -     mometasone (NASONEX) 50 MCG/ACT nasal spray; Place 2 sprays into the nose daily. -     levocetirizine (XYZAL) 5 MG tablet; Take 1 tablet (5 mg total) by mouth every evening. Overdue for annual appt w/Dr. Sharlet Salina must see provider for future refills  Cough due to bronchospasm -     mometasone (NASONEX) 50 MCG/ACT nasal spray; Place 2 sprays into the nose daily. -     albuterol (PROVENTIL HFA;VENTOLIN HFA) 108 (90 Base) MCG/ACT inhaler; Inhale 1-2 puffs into the lungs every 6 (six) hours as needed for wheezing or shortness of breath.  Other orders -     cholecalciferol (VITAMIN D) 1000 units tablet; Take 1 tablet (1,000 Units total) by mouth daily.   I have discontinued Grace George's Vitamin D (Ergocalciferol), dextromethorphan-guaiFENesin, sodium chloride, and azithromycin. I am also having her start on cholecalciferol and albuterol. Additionally, I am having her maintain her Multiple Vitamins-Calcium (ONE-A-DAY WOMENS PO), atenolol, simvastatin, pantoprazole, mometasone, and levocetirizine.  Meds ordered this encounter  Medications  . cholecalciferol  (VITAMIN D) 1000 units tablet    Sig: Take 1 tablet (1,000 Units total) by mouth daily.    Order Specific Question:   Supervising Provider    Answer:   Cassandria Anger [[id number]]  . mometasone (NASONEX) 50 MCG/ACT nasal spray    Sig: Place 2 sprays into the nose daily.    Dispense:  17 g    Refill:  11    Order Specific Question:   Supervising Provider    Answer:   Cassandria Anger [[id number]]  . levocetirizine (XYZAL) 5 MG tablet    Sig: Take 1 tablet (5 mg total) by mouth every evening. Overdue for annual appt w/Dr. Sharlet Salina must see provider for future refills    Dispense:  90 tablet    Refill:  3    Order Specific Question:   Supervising Provider    Answer:   Cassandria Anger [[id number]]  . albuterol (PROVENTIL HFA;VENTOLIN HFA) 108 (90 Base) MCG/ACT inhaler  Sig: Inhale 1-2 puffs into the lungs every 6 (six) hours as needed for wheezing or shortness of breath.    Dispense:  1 Inhaler    Refill:  0    Order Specific Question:   Supervising Provider    Answer:   Cassandria Anger [[id number]]    Follow-up: Return if symptoms worsen or fail to improve.  Wilfred Lacy, NP

## 2016-12-23 NOTE — Patient Instructions (Signed)
Cough, Adult Coughing is a reflex that clears your throat and your airways. Coughing helps to heal and protect your lungs. It is normal to cough occasionally, but a cough that happens with other symptoms or lasts a long time may be a sign of a condition that needs treatment. A cough may last only 2-3 weeks (acute), or it may last longer than 8 weeks (chronic). What are the causes? Coughing is commonly caused by:  Breathing in substances that irritate your lungs.  A viral or bacterial respiratory infection.  Allergies.  Asthma.  Postnasal drip.  Smoking.  Acid backing up from the stomach into the esophagus (gastroesophageal reflux).  Certain medicines.  Chronic lung problems, including COPD (or rarely, lung cancer).  Other medical conditions such as heart failure.  Follow these instructions at home: Pay attention to any changes in your symptoms. Take these actions to help with your discomfort:  Take medicines only as told by your health care provider. ? If you were prescribed an antibiotic medicine, take it as told by your health care provider. Do not stop taking the antibiotic even if you start to feel better. ? Talk with your health care provider before you take a cough suppressant medicine.  Drink enough fluid to keep your urine clear or pale yellow.  If the air is dry, use a cold steam vaporizer or humidifier in your bedroom or your home to help loosen secretions.  Avoid anything that causes you to cough at work or at home.  If your cough is worse at night, try sleeping in a semi-upright position.  Avoid cigarette smoke. If you smoke, quit smoking. If you need help quitting, ask your health care provider.  Avoid caffeine.  Avoid alcohol.  Rest as needed.  Contact a health care provider if:  You have new symptoms.  You cough up pus.  Your cough does not get better after 2-3 weeks, or your cough gets worse.  You cannot control your cough with suppressant  medicines and you are losing sleep.  You develop pain that is getting worse or pain that is not controlled with pain medicines.  You have a fever.  You have unexplained weight loss.  You have night sweats. Get help right away if:  You cough up blood.  You have difficulty breathing.  Your heartbeat is very fast. This information is not intended to replace advice given to you by your health care provider. Make sure you discuss any questions you have with your health care provider. Document Released: 10/11/2010 Document Revised: 09/20/2015 Document Reviewed: 06/21/2014 Elsevier Interactive Patient Education  2017 [employer] Inc[employer]  

## 2017-02-10 ENCOUNTER — Ambulatory Visit: Payer: BC Managed Care – PPO

## 2017-02-19 ENCOUNTER — Ambulatory Visit (INDEPENDENT_AMBULATORY_CARE_PROVIDER_SITE_OTHER): Payer: BC Managed Care – PPO | Admitting: General Practice

## 2017-02-19 DIAGNOSIS — Z23 Encounter for immunization: Secondary | ICD-10-CM

## 2017-06-16 ENCOUNTER — Telehealth: Payer: Self-pay | Admitting: Certified Nurse Midwife

## 2017-06-16 NOTE — Telephone Encounter (Signed)
Patient called and requested a prescription for alprazolam for her "nerves" be sent to the [facility] at [place].

## 2017-06-16 NOTE — Telephone Encounter (Signed)
Left message to call Grace George at [phone].  

## 2017-06-18 ENCOUNTER — Ambulatory Visit: Payer: BC Managed Care – PPO | Admitting: Podiatry

## 2017-06-18 NOTE — Telephone Encounter (Signed)
Spoke with patient, requesting refill of Xanax, previously prescribed by PCP. Patient states she was advised by PCP she would need an OV first, calling to see if Grace George, CNM could prescribe.   Patient reports "lots of life changes right now", denies suicidal ideations. "Needs something for nerves".   Denies any GYN complaints/concerns.   Recommended patient f/u with PCP for further evaluation and refills for Xanax. Patient inquired when next AEX is scheduled for, 08/18/17 at 8:30am. Patient thankful for return call and verbalizes understanding.   Routing to provider for final review. Patient is agreeable to disposition. Will close encounter.

## 2017-06-19 ENCOUNTER — Ambulatory Visit: Payer: BC Managed Care – PPO | Admitting: Internal Medicine

## 2017-06-19 ENCOUNTER — Encounter: Payer: Self-pay | Admitting: Internal Medicine

## 2017-06-19 DIAGNOSIS — K219 Gastro-esophageal reflux disease without esophagitis: Secondary | ICD-10-CM

## 2017-06-19 DIAGNOSIS — F418 Other specified anxiety disorders: Secondary | ICD-10-CM

## 2017-06-19 MED ORDER — PANTOPRAZOLE SODIUM 40 MG PO TBEC: 40.0000 mg | DELAYED_RELEASE_TABLET | Freq: Every day | ORAL | 1 refills | Status: DC

## 2017-06-19 MED ORDER — ALPRAZOLAM 0.5 MG PO TABS: 0.5000 mg | ORAL_TABLET | Freq: Every evening | ORAL | 1 refills | Status: DC | PRN

## 2017-06-19 NOTE — Patient Instructions (Signed)
We have sent in the protonix 40 mg pills.   We have sent in the refill of the xanax.   Think about starting some exercise to help burn off stress and take some you time to care for you. You can lend more strength to others when you take time to recharge your own strength.

## 2017-06-19 NOTE — Assessment & Plan Note (Signed)
Still has most of the bottle from back in 2016 leftover and she was scared to take. Will give rx for xanax #30 1 refill and if she runs out needs visit back again. Talked to her about counseling or talking to family or church member. She will work on more self care and exercise to help.

## 2017-06-19 NOTE — Progress Notes (Signed)
   Subjective:    Patient ID: Grace George, female    DOB: 1960/09/26, 57 y.o.   MRN: [id number]  HPI The patient is a 57 YO female coming in for situational stress going on for months. Her husband has been in poor health and has recently been diagnosed with cancer. She does not have a good support network, 1 sister she does not feel able to open up with. 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 Denies SI/HI. Denies depression. Also having stress at work. Not sleeping well due to her husband getting up at night a lot.   Review of Systems  Constitutional: Negative.   Respiratory: Negative for cough, chest tightness and shortness of breath.   Cardiovascular: Negative for chest pain, palpitations and leg swelling.  Gastrointestinal: Negative for abdominal distention, abdominal pain, constipation, diarrhea, nausea and vomiting.  Musculoskeletal: Negative.   Skin: Negative.   Neurological: Negative.   Psychiatric/Behavioral: Positive for dysphoric mood and sleep disturbance. Negative for agitation, behavioral problems, decreased concentration, hallucinations, self-injury and suicidal ideas. The patient is nervous/anxious. The patient is not hyperactive.       Objective:   Physical Exam  Constitutional: She is oriented to person, place, and time. She appears well-developed and well-nourished.  HENT:  Head: Normocephalic and atraumatic.  Eyes: EOM are normal.  Neck: Normal range of motion.  Cardiovascular: Normal rate and regular rhythm.  Pulmonary/Chest: Effort normal and breath sounds normal. No respiratory distress. She has no wheezes. She has no rales.  Abdominal: Soft. Bowel sounds are normal. She exhibits no distension. There is no tenderness. There is no rebound.  Musculoskeletal: She exhibits no edema.  Neurological: She is alert and oriented to person, place, and time. Coordination normal.  Skin: Skin is warm  and dry.  Psychiatric: She has a normal mood and affect.  Emotional during the visit   Vitals:   06/19/17 0845  BP: 116/70  Pulse: (!) 57  Temp: 98.2 F (36.8 C)  TempSrc: Oral  SpO2: 99%  Weight: 159 lb (72.1 kg)  Height: 5\' 3"  (1.6 m)      Assessment & Plan:

## 2017-08-18 ENCOUNTER — Ambulatory Visit: Payer: BC Managed Care – PPO | Admitting: Certified Nurse Midwife

## 2017-09-22 ENCOUNTER — Other Ambulatory Visit: Payer: Self-pay | Admitting: Cardiovascular Disease

## 2017-09-22 NOTE — Telephone Encounter (Signed)
Rx sent to pharmacy   

## 2017-09-29 ENCOUNTER — Encounter: Payer: Self-pay | Admitting: Certified Nurse Midwife

## 2017-09-29 ENCOUNTER — Ambulatory Visit (INDEPENDENT_AMBULATORY_CARE_PROVIDER_SITE_OTHER): Payer: BC Managed Care – PPO | Admitting: Certified Nurse Midwife

## 2017-09-29 ENCOUNTER — Other Ambulatory Visit: Payer: Self-pay

## 2017-09-29 VITALS — BP 120/64 | HR 70 | Resp 16 | Ht 63.0 in | Wt 159.0 lb

## 2017-09-29 DIAGNOSIS — Z01419 Encounter for gynecological examination (general) (routine) without abnormal findings: Secondary | ICD-10-CM

## 2017-09-29 DIAGNOSIS — N951 Menopausal and female climacteric states: Secondary | ICD-10-CM

## 2017-09-29 DIAGNOSIS — Z0000 Encounter for general adult medical examination without abnormal findings: Secondary | ICD-10-CM

## 2017-09-29 LAB — POCT URINALYSIS DIPSTICK
Bilirubin, UA: NEGATIVE
GLUCOSE UA: NEGATIVE
Ketones, UA: NEGATIVE
LEUKOCYTES UA: NEGATIVE
NITRITE UA: NEGATIVE
PH UA: 5 (ref 5.0–8.0)
PROTEIN UA: NEGATIVE
RBC UA: NEGATIVE
UROBILINOGEN UA: NEGATIVE U/dL — AB

## 2017-09-29 NOTE — Progress Notes (Signed)
57 y.o. [id number] Married  African American Fe here for annual exam. Denies vaginal bleeding or vaginal dryness. Still having menopausal symptoms hot flashes only. Sees Dr. [name] cardiologist for all labs and heart management of MVP with cholesterol medication and Atenolol and coronary artery disease. All stable per patient. Sees PCP yearly for aex . Recent [place] No other health concerns today.  Patient's last menstrual period was 03/28/2010.          Sexually active: No.  The current method of family planning is post menopausal status.    Exercising: No.  exercise Smoker:  no  Health Maintenance: Pap:  07-20-14 neg, 08-13-16 neg HPV HR neg History of Abnormal Pap: cryo yrs ago MMG:  10-21-16 category c density birads 1:neg Self Breast exams: yes Colonoscopy:  2015 neg per patient 5 years ( previous polyp) BMD:   none TDaP:  2015 Shingles: had shingles but not vaccine Pneumonia: no Hep C and HIV: hep c neg 2017 Labs: poct urine-neg   reports that she has never smoked. She has never used smokeless tobacco. She reports that she does not drink alcohol or use drugs.  Past Medical History:  Diagnosis Date  . Abnormal Pap smear   . Allergic rhinitis   . Allergy   . Anemia   . Anxiety   . CAD (coronary artery disease)   . GERD (gastroesophageal reflux disease)   . History of shingles 1/15  . History of stress test 06/2011   Abnormal Myocadial perfusion scan demomstrating an attenuation defect in the anterior region of the myocardium, No ischemia or infaract/scar is seenin the remaining myocardium. Excercise capacity 12 METS.  [name] HTN (hypertension)   . Hx of echocardiogram 2009   Showed normal systolic and diastolic function. she does have borderline mitral valve prolapse.  [name] Hyperlipidemia    per pt NO  . Lumbar back pain   . Migraines   . MVP (mitral valve prolapse)   . 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 Post-operative  nausea and vomiting     Past Surgical History:  Procedure Laterality Date  . CESAREAN SECTION     twice  . CRYOABLATION     times 2 for abn paps  . HYSTEROSCOPY    . WRIST SURGERY     right    Current Outpatient Medications  Medication Sig Dispense Refill  . atenolol (TENORMIN) 50 MG tablet Take 1 tablet (50 mg total) by mouth daily. 90 tablet 3  . levocetirizine (XYZAL) 5 MG tablet Take 1 tablet (5 mg total) by mouth every evening. Overdue for annual appt w/Dr. Sharlet Salina must see provider for future refills 90 tablet 3  . Multiple Vitamins-Calcium (ONE-A-DAY WOMENS PO) Take by mouth. Alive Womens One  A Day Chewables-Take 2 daily    . pantoprazole (PROTONIX) 40 MG tablet Take 1 tablet (40 mg total) by mouth daily. 90 tablet 1  . simvastatin (ZOCOR) 40 MG tablet Take 1 tablet (40 mg total) by mouth daily at 6 PM. Keep OV 90 tablet 0   No current facility-administered medications for this visit.     Family History  Problem Relation Age of Onset  . Breast cancer Mother   . Heart disease Mother   . Diabetes Mother   . Hypertension Mother   . Prostate cancer Father   . Diabetes Sister   . Hypertension Sister   . Heart disease Brother   . Other Brother  CHF, had heart transplant    ROS:  Pertinent items are noted in HPI.  Otherwise, a comprehensive ROS was negative.  Exam:   BP 120/64   Pulse 70   Resp 16   Ht 5\' 3"  (1.6 m)   Wt 159 lb (72.1 kg)   LMP 03/28/2010   BMI 28.17 kg/m  Height: 5\' 3"  (160 cm) Ht Readings from Last 3 Encounters:  09/29/17 5\' 3"  (1.6 m)  06/19/17 5\' 3"  (1.6 m)  12/23/16 5\' 3"  (1.6 m)    General appearance: alert, cooperative and appears stated age Head: Normocephalic, without obvious abnormality, atraumatic Neck: no adenopathy, supple, symmetrical, trachea midline and thyroid normal to inspection and palpation Lungs: clear to auscultation bilaterally Breasts: normal appearance, no masses or tenderness, No nipple retraction or  dimpling, No nipple discharge or bleeding, No axillary or supraclavicular adenopathy Heart: regular rate and rhythm Abdomen: soft, non-tender; no masses,  no organomegaly Extremities: extremities normal, atraumatic, no cyanosis or edema Skin: Skin color, texture, turgor normal. No rashes or lesions Lymph nodes: Cervical, supraclavicular, and axillary nodes normal. No abnormal inguinal nodes palpated Neurologic: Grossly normal   Pelvic: External genitalia:  no lesions              Urethra:  normal appearing urethra with no masses, tenderness or lesions              Bartholin's and Skene's: normal                 Vagina: normal appearing vagina with normal color and discharge, no lesions              Cervix: no cervical motion tenderness, no lesions and normal appearance              Pap taken: No. Bimanual Exam:  Uterus:  normal size, contour, position, consistency, mobility, non-tender              Adnexa: normal adnexa and no mass, fullness, tenderness               Rectovaginal: Confirms               Anus:  normal sphincter tone, no lesions  Chaperone present: yes  A:  Well Woman with normal exam  Menopausal no HRT  MVP,coronary heart disease, cholesterol management with MD  P:   Reviewed health and wellness pertinent to exam  Aware if vaginal bleeding to advise  Continue follow up with PCP/Cardiology as indicated  Pap smear: no   counseled on breast self exam, mammography screening, feminine hygiene, adequate intake of calcium and vitamin D, diet and exercise  return annually or prn  An After Visit Summary was printed and given to the patient.

## 2017-09-29 NOTE — Patient Instructions (Signed)

## 2017-10-09 ENCOUNTER — Telehealth: Payer: Self-pay | Admitting: Cardiovascular Disease

## 2017-10-09 DIAGNOSIS — I10 Essential (primary) hypertension: Secondary | ICD-10-CM

## 2017-10-09 DIAGNOSIS — I491 Atrial premature depolarization: Secondary | ICD-10-CM

## 2017-10-09 DIAGNOSIS — Z79899 Other long term (current) drug therapy: Secondary | ICD-10-CM

## 2017-10-09 DIAGNOSIS — E7800 Pure hypercholesterolemia, unspecified: Secondary | ICD-10-CM

## 2017-10-09 DIAGNOSIS — I059 Rheumatic mitral valve disease, unspecified: Secondary | ICD-10-CM

## 2017-10-09 NOTE — Telephone Encounter (Signed)
Pt states she usually have labs drawn before her yearly appointment. Routing to Dr. [name] for orders.

## 2017-10-09 NOTE — Telephone Encounter (Signed)
New Message:     Pt says she has an appointment with Dr [name] on 10-21-17. She says she usually have her lab work before she comes. Please send her an order so she can have her lab work please.

## 2017-10-09 NOTE — Telephone Encounter (Addendum)
Fasting labs ordered.   Patient made aware and verbalized understanding.

## 2017-10-13 ENCOUNTER — Telehealth: Payer: Self-pay | Admitting: Internal Medicine

## 2017-10-13 MED ORDER — LINACLOTIDE 145 MCG PO CAPS: 145.0000 ug | ORAL_CAPSULE | Freq: Every day | ORAL | 3 refills | Status: DC

## 2017-10-13 NOTE — Telephone Encounter (Signed)
Patient called back and states that she has always had problems with constipation and has been on dolculax for a long time and it just is not doing what she needs it to do. Patient heard about linzess and is wanting to know if she can start on that or get samples first to see if it works. Patient aware she may need an office visit.

## 2017-10-13 NOTE — Telephone Encounter (Signed)
Copied from [place] [id number]. Topic: Inquiry >> Oct 13, 2017  1:52 PM Mylinda Latina, NT wrote: Reason for CRM:  Patient called and states that she would like to try Linzess . Patient is wondering if you have any samples that she can try before she ask Dr. Sharlet Salina for a RX. CB # [phone]

## 2017-10-13 NOTE — Telephone Encounter (Signed)
Patient informed and stated understanding.

## 2017-10-13 NOTE — Telephone Encounter (Signed)
LVM for patient to call back and let us know what has been going on (what kind of symptoms) that makes patient want to try the linzess and that Dr. Sharlet Salina may want patient to make an appointment before having her start any medication

## 2017-10-13 NOTE — Telephone Encounter (Signed)
Sent in linzess. This is a 1 pill a day medicine and works best if taken daily. We have sent in the middle dose. If diarrhea let us know there is a lower dose. If not working well enough let us know and we can adjust dose as well. We do not keep any samples.

## 2017-10-16 LAB — COMPREHENSIVE METABOLIC PANEL
ALBUMIN: 4.6 g/dL (ref 3.5–5.5)
ALK PHOS: 90 IU/L (ref 39–117)
ALT: 26 IU/L (ref 0–32)
AST: 23 IU/L (ref 0–40)
Albumin/Globulin Ratio: 2.3 — ABNORMAL HIGH (ref 1.2–2.2)
BUN / CREAT RATIO: 19 (ref 9–23)
BUN: 14 mg/dL (ref 6–24)
Bilirubin Total: 0.7 mg/dL (ref 0.0–1.2)
CALCIUM: 9.5 mg/dL (ref 8.7–10.2)
CO2: 23 mmol/L (ref 20–29)
CREATININE: 0.72 mg/dL (ref 0.57–1.00)
Chloride: 106 mmol/L (ref 96–106)
GFR calc Af Amer: 107 mL/min/{1.73_m2} (ref >59)
GFR, EST NON AFRICAN AMERICAN: 93 mL/min/{1.73_m2} (ref >59)
GLOBULIN, TOTAL: 2 g/dL (ref 1.5–4.5)
Glucose: 82 mg/dL (ref 65–99)
Potassium: 4.3 mmol/L (ref 3.5–5.2)
SODIUM: 143 mmol/L (ref 134–144)
Total Protein: 6.6 g/dL (ref 6.0–8.5)

## 2017-10-16 LAB — CBC
Hematocrit: 39.6 % (ref 34.0–46.6)
Hemoglobin: 13.3 g/dL (ref 11.1–15.9)
MCH: 29.9 pg (ref 26.6–33.0)
MCHC: 33.6 g/dL (ref 31.5–35.7)
MCV: 89 fL (ref 79–97)
PLATELETS: 162 10*3/uL (ref 150–450)
RBC: 4.45 x10E6/uL (ref 3.77–5.28)
RDW: 13.4 % (ref 12.3–15.4)
WBC: 3.8 10*3/uL (ref 3.4–10.8)

## 2017-10-16 LAB — LIPID PANEL
Chol/HDL Ratio: 1.9 ratio (ref 0.0–4.4)
Cholesterol, Total: 131 mg/dL (ref 100–199)
HDL: 68 mg/dL (ref >39)
LDL Calculated: 54 mg/dL (ref 0–99)
Triglycerides: 43 mg/dL (ref 0–149)
VLDL Cholesterol Cal: 9 mg/dL (ref 5–40)

## 2017-10-16 LAB — TSH: TSH: 3.61 u[IU]/mL (ref 0.450–4.500)

## 2017-10-21 ENCOUNTER — Encounter

## 2017-10-21 ENCOUNTER — Ambulatory Visit: Payer: BC Managed Care – PPO | Admitting: Certified Nurse Midwife

## 2017-10-21 ENCOUNTER — Encounter: Payer: Self-pay | Admitting: Cardiovascular Disease

## 2017-10-21 ENCOUNTER — Ambulatory Visit: Payer: BC Managed Care – PPO | Admitting: Cardiovascular Disease

## 2017-10-21 VITALS — BP 151/75 | HR 52 | Ht 63.0 in | Wt 159.8 lb

## 2017-10-21 DIAGNOSIS — I10 Essential (primary) hypertension: Secondary | ICD-10-CM

## 2017-10-21 DIAGNOSIS — E7800 Pure hypercholesterolemia, unspecified: Secondary | ICD-10-CM

## 2017-10-21 DIAGNOSIS — J302 Other seasonal allergic rhinitis: Secondary | ICD-10-CM | POA: Diagnosis not present

## 2017-10-21 DIAGNOSIS — R002 Palpitations: Secondary | ICD-10-CM | POA: Diagnosis not present

## 2017-10-21 NOTE — Progress Notes (Signed)
Patient ID: [name], female   DOB: 1961-03-08, 57 y.o.   MRN: [id number]     HPI: Grace George is a 57 y.o. female who presents to the office for a 12 month follow-up cardiology evaluation.  Grace George has a history of hypertension, hyperlipidemia, and palpitations and has been treated with beta blocker therapy.  An echo Doppler study in 2009, showed normal systolic and diastolic function and she did have borderline mitral valve prolapse.  She has been on atenolol 50 mg daily for her palpitations.  She also has been on simvastatin 40 mg for hyperlipidemia.  Laboratory on 08/02/2014  revealed a hemoglobin of 13.1, hematocrit 39.9.  A competent metabolic panel was entirely normal.  She had normal renal function and LFTs.  Lipid studies were excellent with a total cholesterol 133, triglycerides 60, HDL 60, and LDL 61.  TSH was normal.  In 2017 she went to the emergency room complaints of shortness of breath.  She denied any exertional symptoms.  Previously, she had been walking on a treadmill for at least 35-40 minutes without discomfort. It was felt that possibly her symptoms were due to allergies.  Since I last saw her, she underwent an echo Doppler study on 10/03/2015.  This revealed normal LV function, infection fraction of 55% without regional wall motion and her maladies.  There was grade 2 diastolic dysfunction.  There was flattening mitral valve closure without frank prolapse and trivial TR.  She had normal pulmonary pressures.  Since I last saw her in May 2018, she has continued to do well from a cardiac standpoint.  Typically her blood pressures have ranged in the 158 range systolically.  She has been maintained on a atenolol 50 mg daily with control of palpitations.  She is on simvastatin 40 mg for hyperlipidemia.  She was recently started on Linzess for chronic constipation but does not seem to have significant benefit from this but was just initiated.  She was seen by her primary  physician earlier in the year with some situational depression.  Her husband was diagnosed with cancer.  He has not been exercising as she had in the past but still walks 3 to 4 days/week.  He denies chest pain PND orthopnea.  She denies presyncope or syncope.  She denies difficulty with sleep.  Past Medical History:  Diagnosis Date  . Abnormal Pap smear   . Allergic rhinitis   . Allergy   . Anemia   . Anxiety   . CAD (coronary artery disease)   . GERD (gastroesophageal reflux disease)   . History of shingles 1/15  . History of stress test 06/2011   Abnormal Myocadial perfusion scan demomstrating an attenuation defect in the anterior region of the myocardium, No ischemia or infaract/scar is seenin the remaining myocardium. Excercise capacity 12 METS.  [name] HTN (hypertension)   . Hx of echocardiogram 2009   Showed normal systolic and diastolic function. she does have borderline mitral valve prolapse.  [name] Hyperlipidemia    per pt NO  . Lumbar back pain   . Migraines   . MVP (mitral valve prolapse)   . 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 Post-operative nausea and vomiting     Past Surgical History:  Procedure Laterality Date  . CESAREAN SECTION     twice  . CRYOABLATION     times 2 for abn paps  . HYSTEROSCOPY    . WRIST SURGERY     right  Allergies  Allergen Reactions  . Eggs Or Egg-Derived Products     Causes a rash, if she eats a lot!  . Sulfa Antibiotics     Rash & itching    Current Outpatient Medications  Medication Sig Dispense Refill  . atenolol (TENORMIN) 50 MG tablet Take 1 tablet (50 mg total) by mouth daily. 90 tablet 3  . levocetirizine (XYZAL) 5 MG tablet Take 1 tablet (5 mg total) by mouth every evening. Overdue for annual appt w/Dr. Sharlet Salina must see provider for future refills 90 tablet 3  . linaclotide (LINZESS) 145 MCG CAPS capsule Take 1 capsule (145 mcg total) by mouth daily before breakfast. 30 capsule 3  . Multiple  Vitamins-Calcium (ONE-A-DAY WOMENS PO) Take by mouth. Alive Womens One  A Day Chewables-Take 2 daily    . pantoprazole (PROTONIX) 40 MG tablet Take 1 tablet (40 mg total) by mouth daily. 90 tablet 1  . simvastatin (ZOCOR) 40 MG tablet Take 1 tablet (40 mg total) by mouth daily at 6 PM. Keep OV 90 tablet 0   No current facility-administered medications for this visit.     Social History   Socioeconomic History  . Marital status: Married    Spouse name: Not on file  . Number of children: 2  . Years of education: Not on file  . Highest education level: Not on file  Occupational History  . Occupation: Insurance claims handler  Social Needs  . Financial resource strain: Not on file  . Food insecurity:    Worry: Not on file    Inability: Not on file  . Transportation needs:    Medical: Not on file    Non-medical: Not on file  Tobacco Use  . Smoking status: Never Smoker  . Smokeless tobacco: Never Used  Substance and Sexual Activity  . Alcohol use: No    Alcohol/week: 0.0 oz  . Drug use: No  . Sexual activity: Not Currently    Partners: Male    Birth control/protection: Post-menopausal  Lifestyle  . Physical activity:    Days per week: Not on file    Minutes per session: Not on file  . Stress: Not on file  Relationships  . Social connections:    Talks on phone: Not on file    Gets together: Not on file    Attends religious service: Not on file    Active member of club or organization: Not on file    Attends meetings of clubs or organizations: Not on file    Relationship status: Not on file  . Intimate partner violence:    Fear of current or ex partner: Not on file    Emotionally abused: Not on file    Physically abused: Not on file    Forced sexual activity: Not on file  Other Topics Concern  . Not on file  Social History Narrative  . Not on file   Updated social history:  she is married and has 2 children, currently age 18 and 2.  She has 2 grandchildren who are  twins.  There is no tobacco or alcohol use.  Of note, her brother underwent cardiac transplantation .   Family History  Problem Relation Age of Onset  . Breast cancer Mother   . Heart disease Mother   . Diabetes Mother   . Hypertension Mother   . Prostate cancer Father   . Diabetes Sister   . Hypertension Sister   . Heart disease Brother   . Other  Brother        CHF, had heart transplant    ROS General: Negative; No fevers, chills, or night sweats;  HEENT: Negative; No changes in vision or hearing, sinus congestion, difficulty swallowing Pulmonary:  mild shortness of breath, felt to be due to allergies Cardiovascular: Negative; No chest pain, presyncope, syncope, palpitations GI: History of GERD. GU: Negative; No dysuria, hematuria, or difficulty voiding Immunologic: Seasonal allergies Musculoskeletal: Negative; no myalgias, joint pain, or weakness Hematologic/Oncology: Negative; no easy bruising, bleeding Endocrine: Negative; no heat/cold intolerance; no diabetes Neuro: Negative; no changes in balance, headaches Skin: Negative; No rashes or skin lesions Psychiatric: Negative; No behavioral problems, depression Sleep: Negative; No snoring, daytime sleepiness, hypersomnolence, bruxism, restless legs, hypnogognic hallucinations, no cataplexy Other comprehensive 14 point system review is negative.   PE BP (!) 151/75   Pulse (!) 52   Ht '5\' 3"'  (1.6 m)   Wt 159 lb 12.8 oz (72.5 kg)   LMP 03/28/2010   BMI 28.31 kg/m    Repeat blood pressure initially taken by me was 140/74.  Repeat blood pressure was 132/74  Wt Readings from Last 3 Encounters:  10/21/17 159 lb 12.8 oz (72.5 kg)  09/29/17 159 lb (72.1 kg)  06/19/17 159 lb (72.1 kg)   General: Alert, oriented, no distress.  Skin: normal turgor, no rashes, warm and dry HEENT: Normocephalic, atraumatic. Pupils equal round and reactive to light; sclera anicteric; extraocular muscles intact;  Nose without nasal septal  hypertrophy Mouth/Parynx benign; Mallinpatti scale 2 Neck: No JVD, no carotid bruits; normal carotid upstroke Lungs: clear to ausculatation and percussion; no wheezing or rales Chest wall: without tenderness to palpitation Heart: PMI not displaced, RRR, s1 s2 normal, 1/6 systolic murmur, no diastolic murmur, no rubs, gallops, thrills, or heaves Abdomen: soft, nontender; no hepatosplenomehaly, BS+; abdominal aorta nontender and not dilated by palpation. Back: no CVA tenderness Pulses 2+ Musculoskeletal: full range of motion, normal strength, no joint deformities Extremities: no clubbing cyanosis or edema, Homan's sign negative  Neurologic: grossly nonfocal; Cranial nerves grossly wnl Psychologic: Normal mood and affect   ECG (independently read by me): Sinus bradycardia 52 bpm.  Nonspecific T change.  Normal intervals.  Borderline first-degree AV block with a PR interval of 204 ms.  May 2018 ECG (independently read by me): Sinus bradycardia 54 bpm..  Borderline First-degree AV block with a PR interval of 202 ms.  May 2017 ECG (independently read by me): Sinus bradycardia 55 bpm.  First degree AV block with a PR interval of 204 ms.  April 2016 ECG (independently read by me): Normal sinus rhythm.  Mild RV conduction delay with T-wave inversion V1 and V2, isolated PAC.  Borderline first-degree AV block with a PR interval at 22 ms.  April 2015 ECG (independently read by me): Sinus rhythm at 72 beats per minute with mild RV conduction delay.  She is previously noted T-wave inversion in leads V1 through V3.  She also has occasional PACs with different morphology to the P wave in these premature complexes.  QTc interval 47 ms.  PR interval 188 ms.  LABS:  BMP Latest Ref Rng & Units 10/16/2017 08/29/2016 08/13/2016  Glucose 65 - 99 mg/dL 82 88 83  BUN 6 - 24 mg/dL '14 9 11  ' Creatinine 0.57 - 1.00 mg/dL 0.72 0.66 0.65  BUN/Creat Ratio 9 - 23 19 - -  Sodium 134 - 144 mmol/L 143 142 140  Potassium  3.5 - 5.2 mmol/L 4.3 4.8 4.5  Chloride 96 -  106 mmol/L 106 106 104  CO2 20 - 29 mmol/L '23 27 31  ' Calcium 8.7 - 10.2 mg/dL 9.5 9.7 9.7     Hepatic Function Latest Ref Rng & Units 10/16/2017 08/29/2016 08/13/2016  Total Protein 6.0 - 8.5 g/dL 6.6 7.0 7.3  Albumin 3.5 - 5.5 g/dL 4.6 4.2 4.4  AST 0 - 40 IU/L '23 25 31  ' ALT 0 - 32 IU/L '26 25 26  ' Alk Phosphatase 39 - 117 IU/L 90 92 92  Total Bilirubin 0.0 - 1.2 mg/dL 0.7 0.6 0.6     CBC Latest Ref Rng & Units 10/16/2017 08/29/2016 08/13/2016  WBC 3.4 - 10.8 x10E3/uL 3.8 3.6(L) 4.7  Hemoglobin 11.1 - 15.9 g/dL 13.3 13.4 13.8  Hematocrit 34.0 - 46.6 % 39.6 41.2 43.2  Platelets 150 - 450 x10E3/uL 162 181 203   Lab Results  Component Value Date   TSH 3.610 10/16/2017    BNP No results found for: PROBNP  Lipid Panel     Component Value Date/Time   CHOL 131 10/16/2017 0804   TRIG 43 10/16/2017 0804   HDL 68 10/16/2017 0804   CHOLHDL 1.9 10/16/2017 0804   CHOLHDL 1.8 08/29/2016 1324   VLDL 10 08/29/2016 1324   LDLCALC 54 10/16/2017 0804     RADIOLOGY: No results found.  IMPRESSION:  1. Essential hypertension   2. HYPERCHOLESTEROLEMIA   3. Other seasonal allergic rhinitis   4. Palpitations     ASSESSMENT AND PLAN: Grace George is a very pleasant 57 year old female who has a history of hypertension, hyperlipidemia, and palpitations.  Her palpitations have stabilized on a atenolol.  Presently her ECG shows sinus bradycardia at 52 bpm.  She is asymptomatic with reference to her slow heart rate or recurrent palpitations.  Her blood pressure typically has run in the 161W systolically.  Blood pressure elevated upon initial presentation today but did improve with subsequent assessment.  He has GERD and has been on Protonix 40 mg daily.  Recently she was having difficulty with constipation and was given a trial of Linzess.  She is not certain if this has been working.  She continues to take simvastatin 40 mg daily.  Reviewed lab work  from last week.  TSH is normal at 3.61.  Chemistry profile was normal.  She was not anemic.  Lipid studies were excellent with a total cholesterol 131, triglycerides 43, HDL 68, and LDL 54.  She has been walking several days per week.  I discussed the potential benefit of increasing activity to at least 5 days/week for 30 minutes at a time per current guideline recommendations.  She continues to take Xyzal for allergies.  As long as she remains stable, I will see her in 1 year for reevaluation.  Time spent: 25 minutes  Troy Sine, MD, [facility]  10/21/2017 1:34 PM

## 2017-10-21 NOTE — Patient Instructions (Signed)

## 2017-10-22 ENCOUNTER — Other Ambulatory Visit: Payer: Self-pay | Admitting: Cardiovascular Disease

## 2017-11-16 ENCOUNTER — Encounter: Payer: Self-pay | Admitting: Internal Medicine

## 2017-11-16 ENCOUNTER — Other Ambulatory Visit (INDEPENDENT_AMBULATORY_CARE_PROVIDER_SITE_OTHER): Payer: BC Managed Care – PPO

## 2017-11-16 ENCOUNTER — Ambulatory Visit: Payer: BC Managed Care – PPO | Admitting: Internal Medicine

## 2017-11-16 VITALS — BP 130/82 | HR 51 | Temp 97.7°F | Resp 16 | Ht 63.0 in | Wt 161.8 lb

## 2017-11-16 DIAGNOSIS — J309 Allergic rhinitis, unspecified: Secondary | ICD-10-CM

## 2017-11-16 DIAGNOSIS — Z0000 Encounter for general adult medical examination without abnormal findings: Secondary | ICD-10-CM | POA: Diagnosis not present

## 2017-11-16 DIAGNOSIS — I10 Essential (primary) hypertension: Secondary | ICD-10-CM | POA: Diagnosis not present

## 2017-11-16 DIAGNOSIS — E7800 Pure hypercholesterolemia, unspecified: Secondary | ICD-10-CM | POA: Diagnosis not present

## 2017-11-16 LAB — VITAMIN D 25 HYDROXY (VIT D DEFICIENCY, FRACTURES): VITD: 18.94 ng/mL — ABNORMAL LOW (ref 30.00–100.00)

## 2017-11-16 MED ORDER — LEVOCETIRIZINE DIHYDROCHLORIDE 5 MG PO TABS: 5.0000 mg | ORAL_TABLET | Freq: Every evening | ORAL | 3 refills | Status: DC

## 2017-11-16 MED ORDER — FLUTICASONE PROPIONATE 50 MCG/ACT NA SUSP: 2.0000 | Freq: Every day | NASAL | 6 refills | Status: DC

## 2017-11-16 NOTE — Assessment & Plan Note (Signed)
BP at goal on atenolol. Last BMP at goal.

## 2017-11-16 NOTE — Assessment & Plan Note (Signed)
Refill xyzal and add flonase.

## 2017-11-16 NOTE — Assessment & Plan Note (Signed)
Last lipid panel at goal on simvastatin.

## 2017-11-16 NOTE — Patient Instructions (Addendum)
We will check the labs and call you back about the results.   Health Maintenance, Female Adopting a healthy lifestyle and getting preventive care can go a long way to promote health and wellness. Talk with your health care provider about what schedule of regular examinations is right for you. This is a good chance for you to check in with your provider about disease prevention and staying healthy. In between checkups, there are plenty of things you can do on your own. Experts have done a lot of research about which lifestyle changes and preventive measures are most likely to keep you healthy. Ask your health care provider for more information. Weight and diet Eat a healthy diet  Be sure to include plenty of vegetables, fruits, low-fat dairy products, and lean protein.  Do not eat a lot of foods high in solid fats, added sugars, or salt.  Get regular exercise. This is one of the most important things you can do for your health. ? Most adults should exercise for at least 150 minutes each week. The exercise should increase your heart rate and make you sweat (moderate-intensity exercise). ? Most adults should also do strengthening exercises at least twice a week. This is in addition to the moderate-intensity exercise.  Maintain a healthy weight  Body mass index (BMI) is a measurement that can be used to identify possible weight problems. It estimates body fat based on height and weight. Your health care provider can help determine your BMI and help you achieve or maintain a healthy weight.  For females 20 years of age and older: ? A BMI below 18.5 is considered underweight. ? A BMI of 18.5 to 24.9 is normal. ? A BMI of 25 to 29.9 is considered overweight. ? A BMI of 30 and above is considered obese.  Watch levels of cholesterol and blood lipids  You should start having your blood tested for lipids and cholesterol at 58 years of age, then have this test every 5 years.  You may need to have  your cholesterol levels checked more often if: ? Your lipid or cholesterol levels are high. ? You are older than 58 years of age. ? You are at high risk for heart disease.  Cancer screening Lung Cancer  Lung cancer screening is recommended for adults 51-76 years old who are at high risk for lung cancer because of a history of smoking.  A yearly low-dose CT scan of the lungs is recommended for people who: ? Currently smoke. ? Have quit within the past 15 years. ? Have at least a 30-pack-year history of smoking. A pack year is smoking an average of one pack of cigarettes a day for 1 year.  Yearly screening should continue until it has been 15 years since you quit.  Yearly screening should stop if you develop a health problem that would prevent you from having lung cancer treatment.  Breast Cancer  Practice breast self-awareness. This means understanding how your breasts normally appear and feel.  It also means doing regular breast self-exams. Let your health care provider know about any changes, no matter how small.  If you are in your 20s or 30s, you should have a clinical breast exam (CBE) by a health care provider every 1-3 years as part of a regular health exam.  If you are 10 or older, have a CBE every year. Also consider having a breast X-ray (mammogram) every year.  If you have a family history of breast cancer, talk to  your health care provider about genetic screening.  If you are at high risk for breast cancer, talk to your health care provider about having an MRI and a mammogram every year.  Breast cancer gene (BRCA) assessment is recommended for women who have family members with BRCA-related cancers. BRCA-related cancers include: ? Breast. ? Ovarian. ? Tubal. ? Peritoneal cancers.  Results of the assessment will determine the need for genetic counseling and BRCA1 and BRCA2 testing.  Cervical Cancer Your health care provider may recommend that you be screened  regularly for cancer of the pelvic organs (ovaries, uterus, and vagina). This screening involves a pelvic examination, including checking for microscopic changes to the surface of your cervix (Pap test). You may be encouraged to have this screening done every 3 years, beginning at age 80.  For women ages 9-65, health care providers may recommend pelvic exams and Pap testing every 3 years, or they may recommend the Pap and pelvic exam, combined with testing for human papilloma virus (HPV), every 5 years. Some types of HPV increase your risk of cervical cancer. Testing for HPV may also be done on women of any age with unclear Pap test results.  Other health care providers may not recommend any screening for nonpregnant women who are considered low risk for pelvic cancer and who do not have symptoms. Ask your health care provider if a screening pelvic exam is right for you.  If you have had past treatment for cervical cancer or a condition that could lead to cancer, you need Pap tests and screening for cancer for at least 20 years after your treatment. If Pap tests have been discontinued, your risk factors (such as having a new sexual partner) need to be reassessed to determine if screening should resume. Some women have medical problems that increase the chance of getting cervical cancer. In these cases, your health care provider may recommend more frequent screening and Pap tests.  Colorectal Cancer  This type of cancer can be detected and often prevented.  Routine colorectal cancer screening usually begins at 58 years of age and continues through 58 years of age.  Your health care provider may recommend screening at an earlier age if you have risk factors for colon cancer.  Your health care provider may also recommend using home test kits to check for hidden blood in the stool.  A small camera at the end of a tube can be used to examine your colon directly (sigmoidoscopy or colonoscopy). This is  done to check for the earliest forms of colorectal cancer.  Routine screening usually begins at age 45.  Direct examination of the colon should be repeated every 5-10 years through 58 years of age. However, you may need to be screened more often if early forms of precancerous polyps or small growths are found.  Skin Cancer  Check your skin from head to toe regularly.  Tell your health care provider about any new moles or changes in moles, especially if there is a change in a mole's shape or color.  Also tell your health care provider if you have a mole that is larger than the size of a pencil eraser.  Always use sunscreen. Apply sunscreen liberally and repeatedly throughout the day.  Protect yourself by wearing long sleeves, pants, a wide-brimmed hat, and sunglasses whenever you are outside.  Heart disease, diabetes, and high blood pressure  High blood pressure causes heart disease and increases the risk of stroke. High blood pressure is more likely  to develop in: ? People who have blood pressure in the high end of the normal range (130-139/85-89 mm Hg). ? People who are overweight or obese. ? People who are African American.  If you are 41-24 years of age, have your blood pressure checked every 3-5 years. If you are 31 years of age or older, have your blood pressure checked every year. You should have your blood pressure measured twice-once when you are at a hospital or clinic, and once when you are not at a hospital or clinic. Record the average of the two measurements. To check your blood pressure when you are not at a hospital or clinic, you can use: ? An automated blood pressure machine at a pharmacy. ? A home blood pressure monitor.  If you are between 45 years and 72 years old, ask your health care provider if you should take aspirin to prevent strokes.  Have regular diabetes screenings. This involves taking a blood sample to check your fasting blood sugar level. ? If you are  at a normal weight and have a low risk for diabetes, have this test once every three years after 58 years of age. ? If you are overweight and have a high risk for diabetes, consider being tested at a younger age or more often. Preventing infection Hepatitis B  If you have a higher risk for hepatitis B, you should be screened for this virus. You are considered at high risk for hepatitis B if: ? You were born in a country where hepatitis B is common. Ask your health care provider which countries are considered high risk. ? Your parents were born in a high-risk country, and you have not been immunized against hepatitis B (hepatitis B vaccine). ? You have HIV or AIDS. ? You use needles to inject street drugs. ? You live with someone who has hepatitis B. ? You have had sex with someone who has hepatitis B. ? You get hemodialysis treatment. ? You take certain medicines for conditions, including cancer, organ transplantation, and autoimmune conditions.  Hepatitis C  Blood testing is recommended for: ? Everyone born from [birth year] through 1966. ? Anyone with known risk factors for hepatitis C.  Sexually transmitted infections (STIs)  You should be screened for sexually transmitted infections (STIs) including gonorrhea and chlamydia if: ? You are sexually active and are younger than 58 years of age. ? You are older than 58 years of age and your health care provider tells you that you are at risk for this type of infection. ? Your sexual activity has changed since you were last screened and you are at an increased risk for chlamydia or gonorrhea. Ask your health care provider if you are at risk.  If you do not have HIV, but are at risk, it may be recommended that you take a prescription medicine daily to prevent HIV infection. This is called pre-exposure prophylaxis (PrEP). You are considered at risk if: ? You are sexually active and do not regularly use condoms or know the HIV status of your  partner(s). ? You take drugs by injection. ? You are sexually active with a partner who has HIV.  Talk with your health care provider about whether you are at high risk of being infected with HIV. If you choose to begin PrEP, you should first be tested for HIV. You should then be tested every 3 months for as long as you are taking PrEP. Pregnancy  If you are premenopausal and you may become  pregnant, ask your health care provider about preconception counseling.  If you may become pregnant, take 400 to 800 micrograms (mcg) of folic acid every day.  If you want to prevent pregnancy, talk to your health care provider about birth control (contraception). Osteoporosis and menopause  Osteoporosis is a disease in which the bones lose minerals and strength with aging. This can result in serious bone fractures. Your risk for osteoporosis can be identified using a bone density scan.  If you are 72 years of age or older, or if you are at risk for osteoporosis and fractures, ask your health care provider if you should be screened.  Ask your health care provider whether you should take a calcium or vitamin D supplement to lower your risk for osteoporosis.  Menopause may have certain physical symptoms and risks.  Hormone replacement therapy may reduce some of these symptoms and risks. Talk to your health care provider about whether hormone replacement therapy is right for you. Follow these instructions at home:  Schedule regular health, dental, and eye exams.  Stay current with your immunizations.  Do not use any tobacco products including cigarettes, chewing tobacco, or electronic cigarettes.  If you are pregnant, do not drink alcohol.  If you are breastfeeding, limit how much and how often you drink alcohol.  Limit alcohol intake to no more than 1 drink per day for nonpregnant women. One drink equals 12 ounces of beer, 5 ounces of wine, or 1 ounces of hard liquor.  Do not use street  drugs.  Do not share needles.  Ask your health care provider for help if you need support or information about quitting drugs.  Tell your health care provider if you often feel depressed.  Tell your health care provider if you have ever been abused or do not feel safe at home. This information is not intended to replace advice given to you by your health care provider. Make sure you discuss any questions you have with your health care provider. Document Released: 10/28/2010 Document Revised: 09/20/2015 Document Reviewed: 01/16/2015 Elsevier Interactive Patient Education  [employer].

## 2017-11-16 NOTE — Progress Notes (Signed)
   Subjective:    Patient ID: Grace George, female    DOB: 1961/04/07, 57 y.o.   MRN: [id number]  HPI The patient is a 57 YO female coming in for physical. Recent labs at cardiology.   PMH, [facility], social history reviewed and updated.   Review of Systems  Constitutional: Negative.   HENT: Negative.   Eyes: Negative.   Respiratory: Negative for cough, chest tightness and shortness of breath.   Cardiovascular: Negative for chest pain, palpitations and leg swelling.  Gastrointestinal: Negative for abdominal distention, abdominal pain, constipation, diarrhea, nausea and vomiting.  Musculoskeletal: Negative.   Skin: Negative.   Neurological: Negative.   Psychiatric/Behavioral: Negative.       Objective:   Physical Exam  Constitutional: She is oriented to person, place, and time. She appears well-developed and well-nourished.  HENT:  Head: Normocephalic and atraumatic.  Eyes: EOM are normal.  Neck: Normal range of motion.  Cardiovascular: Normal rate and regular rhythm.  Pulmonary/Chest: Effort normal and breath sounds normal. No respiratory distress. She has no wheezes. She has no rales.  Abdominal: Soft. Bowel sounds are normal. She exhibits no distension. There is no tenderness. There is no rebound.  Musculoskeletal: She exhibits no edema.  Neurological: She is alert and oriented to person, place, and time. Coordination normal.  Skin: Skin is warm and dry.  Psychiatric: She has a normal mood and affect.   Vitals:   11/16/17 0759  BP: 130/82  Pulse: (!) 51  Resp: 16  Temp: 97.7 F (36.5 C)  TempSrc: Oral  SpO2: 97%  Weight: 161 lb 12.8 oz (73.4 kg)  Height: 5\' 3"  (1.6 m)      Assessment & Plan:

## 2017-11-16 NOTE — Assessment & Plan Note (Signed)
Flu shot yearly. Shingrix added to waiting list. Tetanus up to date. Colonoscopy due 2020. Mammogram up to date, pap smear up to date and dexa up to date. Counseled about sun safety and mole surveillance. Counseled about the dangers of distracted driving. Given 10 year screening recommendations.

## 2017-11-17 ENCOUNTER — Other Ambulatory Visit: Payer: Self-pay | Admitting: Internal Medicine

## 2017-11-17 LAB — HIV ANTIBODY (ROUTINE TESTING W REFLEX): HIV 1&2 Ab, 4th Generation: NONREACTIVE

## 2017-11-17 MED ORDER — VITAMIN D (ERGOCALCIFEROL) 1.25 MG (50000 UNIT) PO CAPS: 50000.0000 [IU] | ORAL_CAPSULE | ORAL | 0 refills | Status: DC

## 2017-12-21 ENCOUNTER — Other Ambulatory Visit: Payer: Self-pay | Admitting: Cardiovascular Disease

## 2018-01-20 ENCOUNTER — Ambulatory Visit: Payer: BC Managed Care – PPO | Admitting: Internal Medicine

## 2018-01-20 ENCOUNTER — Encounter: Payer: Self-pay | Admitting: Internal Medicine

## 2018-01-20 VITALS — BP 142/74 | HR 61 | Temp 99.3°F | Resp 16 | Ht 63.0 in | Wt 161.4 lb

## 2018-01-20 DIAGNOSIS — J0100 Acute maxillary sinusitis, unspecified: Secondary | ICD-10-CM | POA: Diagnosis not present

## 2018-01-20 MED ORDER — AMOXICILLIN-POT CLAVULANATE 875-125 MG PO TABS: 1.0000 | ORAL_TABLET | Freq: Two times a day (BID) | ORAL | 0 refills | Status: DC

## 2018-01-20 NOTE — Patient Instructions (Signed)
Take over the counter cold medications, tylenol and advil.  Increase rest and fluids   An antibiotic was given - use only if needed if your symptoms do not improve.     Sinusitis, Adult Sinusitis is soreness and inflammation of your sinuses. Sinuses are hollow spaces in the bones around your face. Your sinuses are located:  Around your eyes.  In the middle of your forehead.  Behind your nose.  In your cheekbones.  Your sinuses and nasal passages are lined with a stringy fluid (mucus). Mucus normally drains out of your sinuses. When your nasal tissues become inflamed or swollen, the mucus can become trapped or blocked so air cannot flow through your sinuses. This allows bacteria, viruses, and funguses to grow, which leads to infection. Sinusitis can develop quickly and last for 7?10 days (acute) or for more than 12 weeks (chronic). Sinusitis often develops after a cold. What are the causes? This condition is caused by anything that creates swelling in the sinuses or stops mucus from draining, including:  Allergies.  Asthma.  Bacterial or viral infection.  Abnormally shaped bones between the nasal passages.  Nasal growths that contain mucus (nasal polyps).  Narrow sinus openings.  Pollutants, such as chemicals or irritants in the air.  A foreign object stuck in the nose.  A fungal infection. This is rare.  What increases the risk? The following factors may make you more likely to develop this condition:  Having allergies or asthma.  Having had a recent cold or respiratory tract infection.  Having structural deformities or blockages in your nose or sinuses.  Having a weak immune system.  Doing a lot of swimming or diving.  Overusing nasal sprays.  Smoking.  What are the signs or symptoms? The main symptoms of this condition are pain and a feeling of pressure around the affected sinuses. Other symptoms include:  Upper toothache.  Earache.  Headache.  Bad  breath.  Decreased sense of smell and taste.  A cough that may get worse at night.  Fatigue.  Fever.  Thick drainage from your nose. The drainage is often green and it may contain pus (purulent).  Stuffy nose or congestion.  Postnasal drip. This is when extra mucus collects in the throat or back of the nose.  Swelling and warmth over the affected sinuses.  Sore throat.  Sensitivity to light.  How is this diagnosed? This condition is diagnosed based on symptoms, a medical history, and a physical exam. To find out if your condition is acute or chronic, your health care provider may:  Look in your nose for signs of nasal polyps.  Tap over the affected sinus to check for signs of infection.  View the inside of your sinuses using an imaging device that has a light attached (endoscope).  If your health care provider suspects that you have chronic sinusitis, you may also:  Be tested for allergies.  Have a sample of mucus taken from your nose (nasal culture) and checked for bacteria.  Have a mucus sample examined to see if your sinusitis is related to an allergy.  If your sinusitis does not respond to treatment and it lasts longer than 8 weeks, you may have an MRI or CT scan to check your sinuses. These scans also help to determine how severe your infection is. In rare cases, a bone biopsy may be done to rule out more serious types of fungal sinus disease. How is this treated? Treatment for sinusitis depends on the cause  and whether your condition is chronic or acute. If a virus is causing your sinusitis, your symptoms will go away on their own within 10 days. You may be given medicines to relieve your symptoms, including:  Topical nasal decongestants. They shrink swollen nasal passages and let mucus drain from your sinuses.  Antihistamines. These drugs block inflammation that is triggered by allergies. This can help to ease swelling in your nose and sinuses.  Topical nasal  corticosteroids. These are nasal sprays that ease inflammation and swelling in your nose and sinuses.  Nasal saline washes. These rinses can help to get rid of thick mucus in your nose.  If your condition is caused by bacteria, you will be given an antibiotic medicine. If your condition is caused by a fungus, you will be given an antifungal medicine. Surgery may be needed to correct underlying conditions, such as narrow nasal passages. Surgery may also be needed to remove polyps. Follow these instructions at home: Medicines  Take, use, or apply over-the-counter and prescription medicines only as told by your health care provider. These may include nasal sprays.  If you were prescribed an antibiotic medicine, take it as told by your health care provider. Do not stop taking the antibiotic even if you start to feel better. Hydrate and Humidify  Drink enough water to keep your urine clear or pale yellow. Staying hydrated will help to thin your mucus.  Use a cool mist humidifier to keep the humidity level in your home above 50%.  Inhale steam for 10-15 minutes, 3-4 times a day or as told by your health care provider. You can do this in the bathroom while a hot shower is running.  Limit your exposure to cool or dry air. Rest  Rest as much as possible.  Sleep with your head raised (elevated).  Make sure to get enough sleep each night. General instructions  Apply a warm, moist washcloth to your face 3-4 times a day or as told by your health care provider. This will help with discomfort.  Wash your hands often with soap and water to reduce your exposure to viruses and other germs. If soap and water are not available, use hand sanitizer.  Do not smoke. Avoid being around people who are smoking (secondhand smoke).  Keep all follow-up visits as told by your health care provider. This is important. Contact a health care provider if:  You have a fever.  Your symptoms get worse.  Your  symptoms do not improve within 10 days. Get help right away if:  You have a severe headache.  You have persistent vomiting.  You have pain or swelling around your face or eyes.  You have vision problems.  You develop confusion.  Your neck is stiff.  You have trouble breathing. This information is not intended to replace advice given to you by your health care provider. Make sure you discuss any questions you have with your health care provider. Document Released: 04/14/2005 Document Revised: 12/09/2015 Document Reviewed: 02/07/2015 Elsevier Interactive Patient Education  [employer].

## 2018-01-20 NOTE — Assessment & Plan Note (Signed)
Probable sinus infection, but discussed there is a good chance this is viral not bacterial Discussed over-the-counter cold and allergy medications and stressed to start some of these Increase rest and fluids She does have a history of sinus infections and is current the early beginning of a bacterial infection-I will give her a written prescription for Augmentin.  We both agree that should be avoided if possible and she will only take if it does not improve after several days Call with questions or concerns

## 2018-01-20 NOTE — Progress Notes (Signed)
Subjective:    Patient ID: Grace George, female    DOB: 1961/04/07, 57 y.o.   MRN: [id number]  HPI She is here for an acute visit for cold symptoms.  Her symptoms started  2 days ago.   She is experiencing chills, nasal congestion, nosebleeds, postnasal drip, sinus pain, sore throat, cough, headaches and body aches.  She denies any known fever, ear pain, shortness of breath, wheezing, nausea and lightheadedness.  She has taken Tylenol ES.  She does have a history of sinus infections and her last one was earlier this year.  Medications and allergies reviewed with patient and updated if appropriate.  Patient Active Problem List   Diagnosis Date Noted  . Dyspnea 08/03/2015  . Situational anxiety 08/15/2014  . Routine general medical examination at a health care facility 02/02/2014  . PAC (premature atrial contraction) 08/15/2013  . 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 Allergic rhinitis 01/05/2009  . Mitral valve disease 01/25/2008  . HYPERCHOLESTEROLEMIA 01/24/2008  . Essential hypertension 01/24/2008  . GERD 01/24/2008    Current Outpatient Medications on File Prior to Visit  Medication Sig Dispense Refill  . atenolol (TENORMIN) 50 MG tablet TAKE 1 TABLET BY MOUTH EVERY DAY 90 tablet 3  . fluticasone (FLONASE) 50 MCG/ACT nasal spray Place 2 sprays into both nostrils daily. 16 g 6  . levocetirizine (XYZAL) 5 MG tablet Take 1 tablet (5 mg total) by mouth every evening. 90 tablet 3  . Multiple Vitamins-Calcium (ONE-A-DAY WOMENS PO) Take by mouth. Alive Womens One  A Day Chewables-Take 2 daily    . pantoprazole (PROTONIX) 40 MG tablet Take 1 tablet (40 mg total) by mouth daily. 90 tablet 1  . simvastatin (ZOCOR) 40 MG tablet TAKE 1 TABLET (40 MG TOTAL) BY MOUTH DAILY AT 6 PM. PLEASE KEEP OV 90 tablet 2  . Vitamin D, Ergocalciferol, (DRISDOL) 50000 units CAPS capsule Take 1 capsule (50,000 Units total) by mouth every 7 (seven) days. 12 capsule 0     No current facility-administered medications on file prior to visit.     Past Medical History:  Diagnosis Date  . Abnormal Pap smear   . Allergic rhinitis   . Allergy   . Anemia   . Anxiety   . CAD (coronary artery disease)   . GERD (gastroesophageal reflux disease)   . History of shingles 1/15  . History of stress test 06/2011   Abnormal Myocadial perfusion scan demomstrating an attenuation defect in the anterior region of the myocardium, No ischemia or infaract/scar is seenin the remaining myocardium. Excercise capacity 12 METS.  [name] HTN (hypertension)   . Hx of echocardiogram 2009   Showed normal systolic and diastolic function. she does have borderline mitral valve prolapse.  [name] Hyperlipidemia    per pt NO  . Lumbar back pain   . Migraines   . MVP (mitral valve prolapse)   . 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 Post-operative nausea and vomiting     Past Surgical History:  Procedure Laterality Date  . CESAREAN SECTION     twice  . CRYOABLATION     times 2 for abn paps  . HYSTEROSCOPY    . WRIST SURGERY     right    Social History   Socioeconomic History  . Marital status: Married    Spouse name: Not on file  . Number of children: 2  . Years of education: Not on file  .  Highest education level: Not on file  Occupational History  . Occupation: Insurance claims handler  Social Needs  . Financial resource strain: Not on file  . Food insecurity:    Worry: Not on file    Inability: Not on file  . Transportation needs:    Medical: Not on file    Non-medical: Not on file  Tobacco Use  . Smoking status: Never Smoker  . Smokeless tobacco: Never Used  Substance and Sexual Activity  . Alcohol use: No    Alcohol/week: 0.0 standard drinks  . Drug use: No  . Sexual activity: Not Currently    Partners: Male    Birth control/protection: Post-menopausal  Lifestyle  . Physical activity:    Days per week: Not on file    Minutes  per session: Not on file  . Stress: Not on file  Relationships  . Social connections:    Talks on phone: Not on file    Gets together: Not on file    Attends religious service: Not on file    Active member of club or organization: Not on file    Attends meetings of clubs or organizations: Not on file    Relationship status: Not on file  Other Topics Concern  . Not on file  Social History Narrative  . Not on file    Family History  Problem Relation Age of Onset  . Breast cancer Mother   . Heart disease Mother   . Diabetes Mother   . Hypertension Mother   . Prostate cancer Father   . Diabetes Sister   . Hypertension Sister   . Heart disease Brother   . Other Brother        CHF, had heart transplant    Review of Systems  Constitutional: Positive for chills. Negative for fever.  HENT: Positive for congestion, nosebleeds (x couple of weeks), postnasal drip, sinus pain and sore throat (better than yesterday). Negative for ear pain.   Respiratory: Positive for cough. Negative for shortness of breath and wheezing.   Gastrointestinal: Negative for nausea.  Musculoskeletal: Positive for myalgias.  Neurological: Positive for headaches. Negative for dizziness and light-headedness.       Objective:   Vitals:   01/20/18 0932  BP: (!) 142/74  Pulse: 61  Resp: 16  Temp: 99.3 F (37.4 C)  SpO2: 97%   Filed Weights   01/20/18 0932  Weight: 161 lb 6.4 oz (73.2 kg)   Body mass index is 28.59 kg/m.  Wt Readings from Last 3 Encounters:  01/20/18 161 lb 6.4 oz (73.2 kg)  11/16/17 161 lb 12.8 oz (73.4 kg)  10/21/17 159 lb 12.8 oz (72.5 kg)     Physical Exam GENERAL APPEARANCE: Appears stated age, well appearing, NAD EYES: conjunctiva clear, no icterus HEENT: bilateral tympanic membranes and ear canals normal, oropharynx with mild erythema, no thyromegaly, trachea midline, no cervical or supraclavicular lymphadenopathy LUNGS: Clear to auscultation without wheeze or  crackles, unlabored breathing, good air entry bilaterally CARDIOVASCULAR: Normal S1,S2 without murmurs, no edema SKIN: warm, dry        Assessment & Plan:   See Problem List for Assessment and Plan of chronic medical problems.

## 2018-01-25 ENCOUNTER — Ambulatory Visit: Payer: Self-pay | Admitting: Internal Medicine

## 2018-01-25 MED ORDER — HYDROCODONE-HOMATROPINE 5-1.5 MG/5ML PO SYRP: 5.0000 mL | ORAL_SOLUTION | Freq: Three times a day (TID) | ORAL | 0 refills | Status: DC | PRN

## 2018-01-25 NOTE — Telephone Encounter (Signed)
Notified pt w/MD response. Pt is wanting to know can a cough syrup be called into [facility]

## 2018-01-25 NOTE — Telephone Encounter (Signed)
Will forward to dr. Quay Burow for her response.Marland KitchenJohny George

## 2018-01-25 NOTE — Telephone Encounter (Signed)
Augmentin should treat the bronchitis - it will take a few days for her to feel better so I would recommend that she give it another few days.  If she is not feeling better after that she should let us know.

## 2018-01-25 NOTE — Telephone Encounter (Signed)
Already inform pt cough [facility] be [facility]

## 2018-01-25 NOTE — Addendum Note (Signed)
Addended by: Binnie Rail on: 01/25/2018 03:32 PM   Modules accepted: Orders

## 2018-01-25 NOTE — Telephone Encounter (Signed)
Pt saw Dr. Quay Burow Wednesday  01/19/18.Marland KitchenMarland Kitchen"Sinusitis." Was prescribed  Augmentin but instructed to take only if symptoms do not improve within several days. Pt reports started taking the antibiotic Friday as "Did not feel any better." Pt reports cough is now productive for yellowish, green thick phlegm. Denies any wheezing, afebrile. Reports mild SOB "At times" only with coughing. Pt states "I know it's bronchitis. I get it every year at this time." Pt questioning if she should continue  present antibiotics. Pt made aware she may need to be seen.  Please advise: [phone]   Reason for Disposition . Cough  Answer Assessment - Initial Assessment Questions 1. ONSET: "When did the cough begin?"      9/24  Saw Dr. Quay Burow 2. SEVERITY: "How bad is the cough today?"      Not as bad since on antibiotics, moderate 3. RESPIRATORY DISTRESS: "Describe your breathing."      Mild sob with coughing 4. FEVER: "Do you have a fever?" If so, ask: "What is your temperature, how was it measured, and when did it start?"     unsure 5. SPUTUM: "Describe the color of your sputum" (clear, white, yellow, green)     Yellow greenish, thick, new symptoms 6. HEMOPTYSIS: "Are you coughing up any blood?" If so ask: "How much?" (flecks, streaks, tablespoons, etc.)     no 7. CARDIAC HISTORY: "Do you have any history of heart disease?" (e.g., heart attack, congestive heart failure)       8. LUNG HISTORY: "Do you have any history of lung disease?"  (e.g., pulmonary embolus, asthma, emphysema)      9. PE RISK FACTORS: "Do you have a history of blood clots?" (or: recent major surgery, recent prolonged travel, bedridden)      10. OTHER SYMPTOMS: "Do you have any other symptoms?" (e.g., runny nose, wheezing, chest pain)  Mild SOB from coughing  Protocols used: COUGH - ACUTE PRODUCTIVE-A-AH

## 2018-01-25 NOTE — Telephone Encounter (Signed)
Yes, hycodan sent in

## 2018-01-26 ENCOUNTER — Ambulatory Visit: Payer: Self-pay

## 2018-01-26 MED ORDER — HYDROCODONE-HOMATROPINE 5-1.5 MG/5ML PO SYRP: 5.0000 mL | ORAL_SOLUTION | Freq: Three times a day (TID) | ORAL | 0 refills | Status: DC | PRN

## 2018-01-26 NOTE — Telephone Encounter (Signed)
Patient called in with c/o "shortness of breath." She says "it started Sunday off and on, but has gotten worse yesterday and today. I am SOB at rest and with minimal exertion. I usually receive an inhaler when I have bronchitis flare up about once a year, so I called for the inhaler on yesterday, thinking that is what I needed. But, I didn't receive it and my breathing is getting worse today." I asked about other symptoms, she says "coughing, back pain that has eased and dizziness today. I'm just finishing up my antibiotics that was prescribed a couple of weeks ago when I saw Dr. Quay Burow." According to protocol, see PCP within 3 days, no availability with PCP, appointment scheduled for tomorrow at 1020 with Jodi Mourning, FNP, care advice given, patient verbalized understanding, care advice given, patient verbalized understanding.   Reason for Disposition . [1] MODERATE longstanding difficulty breathing (e.g., speaks in phrases, SOB even at rest, pulse 100-120) AND [2] SAME as normal  Answer Assessment - Initial Assessment Questions 1. RESPIRATORY STATUS: "Describe your breathing?" (e.g., wheezing, shortness of breath, unable to speak, severe coughing)      Shortness of breath, coughing 2. ONSET: "When did this breathing problem begin?"      Since Sunday, but worse today 3. PATTERN "Does the difficult breathing come and go, or has it been constant since it started?"      Come and go 4. SEVERITY: "How bad is your breathing?" (e.g., mild, moderate, severe)    - MILD: No SOB at rest, mild SOB with walking, speaks normally in sentences, can lay down, no retractions, pulse < 100.    - MODERATE: SOB at rest, SOB with minimal exertion and prefers to sit, cannot lie down flat, speaks in phrases, mild retractions, audible wheezing, pulse 100-120.    - SEVERE: Very SOB at rest, speaks in single words, struggling to breathe, sitting hunched forward, retractions, pulse > 120      Moderate 5. RECURRENT SYMPTOM:  "Have you had difficulty breathing before?" If so, ask: "When was the last time?" and "What happened that time?"      No, not like this 6. CARDIAC HISTORY: "Do you have any history of heart disease?" (e.g., heart attack, angina, bypass surgery, angioplasty)      Yes 7. LUNG HISTORY: "Do you have any history of lung disease?"  (e.g., pulmonary embolus, asthma, emphysema)     No 8. CAUSE: "What do you think is causing the breathing problem?"      I don't know 9. OTHER SYMPTOMS: "Do you have any other symptoms? (e.g., dizziness, runny nose, cough, chest pain, fever)     Back pain, cough, dizzy today 10. PREGNANCY: "Is there any chance you are pregnant?" "When was your last menstrual period?"       No 11. TRAVEL: "Have you traveled out of the country in the last month?" (e.g., travel history, exposures)       No  Protocols used: BREATHING DIFFICULTY-A-AH

## 2018-01-26 NOTE — Telephone Encounter (Signed)
Rec'd msg cough syrup did not go through. [facility]

## 2018-01-26 NOTE — Addendum Note (Signed)
Addended by: Binnie Rail on: 01/26/2018 12:07 PM   Modules accepted: Orders

## 2018-01-26 NOTE — Telephone Encounter (Signed)
resent

## 2018-01-27 ENCOUNTER — Ambulatory Visit: Payer: BC Managed Care – PPO | Admitting: Family

## 2018-02-01 NOTE — Progress Notes (Deleted)
Subjective:    Patient ID: Grace George, female    DOB: 13-Jun-1960, 57 y.o.   MRN: [id number]  HPI The patient is here for an acute visit.  She was seen on 9/25 and I diagnosed her with a sinus infection.  A prescription for Augmentinn was given in case there was no improvement.     Medications and allergies reviewed with patient and updated if appropriate.  Patient Active Problem List   Diagnosis Date Noted  . Dyspnea 08/03/2015  . Situational anxiety 08/15/2014  . Routine general medical examination at a health care facility 02/02/2014  . PAC (premature atrial contraction) 08/15/2013  . 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 Allergic rhinitis 01/05/2009  . Mitral valve disease 01/25/2008  . Acute non-recurrent maxillary sinusitis 01/25/2008  . HYPERCHOLESTEROLEMIA 01/24/2008  . Essential hypertension 01/24/2008  . GERD 01/24/2008    Current Outpatient Medications on File Prior to Visit  Medication Sig Dispense Refill  . amoxicillin-clavulanate (AUGMENTIN) 875-125 MG tablet Take 1 tablet by mouth 2 (two) times daily. 20 tablet 0  . atenolol (TENORMIN) 50 MG tablet TAKE 1 TABLET BY MOUTH EVERY DAY 90 tablet 3  . fluticasone (FLONASE) 50 MCG/ACT nasal spray Place 2 sprays into both nostrils daily. 16 g 6  . HYDROcodone-homatropine (HYCODAN) 5-1.5 MG/5ML syrup Take 5 mLs by mouth every 8 (eight) hours as needed for cough. 120 mL 0  . levocetirizine (XYZAL) 5 MG tablet Take 1 tablet (5 mg total) by mouth every evening. 90 tablet 3  . Multiple Vitamins-Calcium (ONE-A-DAY WOMENS PO) Take by mouth. Alive Womens One  A Day Chewables-Take 2 daily    . pantoprazole (PROTONIX) 40 MG tablet Take 1 tablet (40 mg total) by mouth daily. 90 tablet 1  . simvastatin (ZOCOR) 40 MG tablet TAKE 1 TABLET (40 MG TOTAL) BY MOUTH DAILY AT 6 PM. PLEASE KEEP OV 90 tablet 2  . Vitamin D, Ergocalciferol, (DRISDOL) 50000 units CAPS capsule Take 1 capsule (50,000 Units  total) by mouth every 7 (seven) days. 12 capsule 0   No current facility-administered medications on file prior to visit.     Past Medical History:  Diagnosis Date  . Abnormal Pap smear   . Allergic rhinitis   . Allergy   . Anemia   . Anxiety   . CAD (coronary artery disease)   . GERD (gastroesophageal reflux disease)   . History of shingles 1/15  . History of stress test 06/2011   Abnormal Myocadial perfusion scan demomstrating an attenuation defect in the anterior region of the myocardium, No ischemia or infaract/scar is seenin the remaining myocardium. Excercise capacity 12 METS.  [name] HTN (hypertension)   . Hx of echocardiogram 2009   Showed normal systolic and diastolic function. she does have borderline mitral valve prolapse.  [name] Hyperlipidemia    per pt NO  . Lumbar back pain   . Migraines   . MVP (mitral valve prolapse)   . 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 Post-operative nausea and vomiting     Past Surgical History:  Procedure Laterality Date  . CESAREAN SECTION     twice  . CRYOABLATION     times 2 for abn paps  . HYSTEROSCOPY    . WRIST SURGERY     right    Social History   Socioeconomic History  . Marital status: Married    Spouse name: Not on file  . Number of  children: 2  . Years of education: Not on file  . Highest education level: Not on file  Occupational History  . Occupation: Insurance claims handler  Social Needs  . Financial resource strain: Not on file  . Food insecurity:    Worry: Not on file    Inability: Not on file  . Transportation needs:    Medical: Not on file    Non-medical: Not on file  Tobacco Use  . Smoking status: Never Smoker  . Smokeless tobacco: Never Used  Substance and Sexual Activity  . Alcohol use: No    Alcohol/week: 0.0 standard drinks  . Drug use: No  . Sexual activity: Not Currently    Partners: Male    Birth control/protection: Post-menopausal  Lifestyle  . Physical  activity:    Days per week: Not on file    Minutes per session: Not on file  . Stress: Not on file  Relationships  . Social connections:    Talks on phone: Not on file    Gets together: Not on file    Attends religious service: Not on file    Active member of club or organization: Not on file    Attends meetings of clubs or organizations: Not on file    Relationship status: Not on file  Other Topics Concern  . Not on file  Social History Narrative  . Not on file    Family History  Problem Relation Age of Onset  . Breast cancer Mother   . Heart disease Mother   . Diabetes Mother   . Hypertension Mother   . Prostate cancer Father   . Diabetes Sister   . Hypertension Sister   . Heart disease Brother   . Other Brother        CHF, had heart transplant    Review of Systems     Objective:  There were no vitals filed for this visit. BP Readings from Last 3 Encounters:  01/20/18 (!) 142/74  11/16/17 130/82  10/21/17 (!) 151/75   Wt Readings from Last 3 Encounters:  01/20/18 161 lb 6.4 oz (73.2 kg)  11/16/17 161 lb 12.8 oz (73.4 kg)  10/21/17 159 lb 12.8 oz (72.5 kg)   There is no height or weight on file to calculate BMI.   Physical Exam         Assessment & Plan:    See Problem List for Assessment and Plan of chronic medical problems.

## 2018-02-02 ENCOUNTER — Encounter: Payer: Self-pay | Admitting: Internal Medicine

## 2018-02-02 ENCOUNTER — Ambulatory Visit: Payer: BC Managed Care – PPO | Admitting: Internal Medicine

## 2018-02-02 DIAGNOSIS — J0100 Acute maxillary sinusitis, unspecified: Secondary | ICD-10-CM | POA: Diagnosis not present

## 2018-02-02 MED ORDER — METHYLPREDNISOLONE ACETATE 40 MG/ML IJ SUSP
40.0000 mg | Freq: Once | INTRAMUSCULAR | Status: AC
  Administered 2018-02-02: 40 mg via INTRAMUSCULAR

## 2018-02-02 NOTE — Assessment & Plan Note (Signed)
Partially resolved. No further antibiotics indicated. Depo-medrol 40 mg IM given at visit. Continue xyzal and flonase.

## 2018-02-02 NOTE — Patient Instructions (Signed)
We have given you the steroid shot today to help you get better.   Keep taking the allergy medicines as well.

## 2018-02-02 NOTE — Progress Notes (Signed)
   Subjective:    Patient ID: Grace George, female    DOB: [birth year], 57 y.o.   MRN: [id number]  HPI The patient is a 57 YO female coming in for sinusitis. She was seen about 2 weeks ago and given antibiotics to take if she was not improving. She did end up taking this a couple of days after her visit. She is taking xyzal and flonase which were not helping. She had symptoms almost a week prior to taking antibiotics. She was having sinus pressure, headaches, sore throat, cough, drainage, ear pain. She had some chills. Since the antibiotic augmentin finished she is having some drainage still, mild sore throat, sinus pressure. She is overall improving but only about 50-60% better. She is still taking xyzal and flonase. She is having fatigue and muscle aches as well. Some cough. No chest pain. Some pain with coughing still although this is fading.   Review of Systems  Constitutional: Positive for activity change and fatigue. Negative for appetite change, chills, fever and unexpected weight change.  HENT: Positive for congestion, postnasal drip, rhinorrhea and sinus pressure. Negative for ear discharge, ear pain, sinus pain, sneezing, sore throat, tinnitus, trouble swallowing and voice change.   Eyes: Negative.   Respiratory: Positive for cough. Negative for chest tightness, shortness of breath and wheezing.   Cardiovascular: Negative.   Gastrointestinal: Negative.   Musculoskeletal: Positive for myalgias.  Neurological: Negative.       Objective:   Physical Exam  Constitutional: She is oriented to person, place, and time. She appears well-developed and well-nourished.  HENT:  Head: Normocephalic and atraumatic.  Oropharynx with redness and clear drainage, nose with swollen turbinates, TMs normal bilaterally, frontal sinus pressure  Eyes: EOM are normal.  Neck: Normal range of motion. No thyromegaly present.  Cardiovascular: Normal rate and regular rhythm.  Pulmonary/Chest: Effort normal and  breath sounds normal. No respiratory distress. She has no wheezes. She has no rales.  Abdominal: Soft.  Musculoskeletal: She exhibits tenderness.  Lymphadenopathy:    She has no cervical adenopathy.  Neurological: She is alert and oriented to person, place, and time.  Skin: Skin is warm and dry.   Vitals:   02/02/18 0915  BP: 126/78  Pulse: (!) 52  Temp: 98.1 F (36.7 C)  TempSrc: Oral  SpO2: 99%  Weight: 161 lb (73 kg)  Height: 5\' 3"  (1.6 m)      Assessment & Plan:  Depo-medrol 40 mg IM given at visit

## 2018-02-07 ENCOUNTER — Other Ambulatory Visit: Payer: Self-pay | Admitting: Internal Medicine

## 2018-02-11 ENCOUNTER — Encounter: Payer: Self-pay | Admitting: Internal Medicine

## 2018-02-11 ENCOUNTER — Ambulatory Visit: Payer: BC Managed Care – PPO | Admitting: Internal Medicine

## 2018-02-11 VITALS — BP 100/60 | HR 47 | Temp 97.5°F | Ht 63.0 in | Wt 160.0 lb

## 2018-02-11 DIAGNOSIS — J0100 Acute maxillary sinusitis, unspecified: Secondary | ICD-10-CM | POA: Diagnosis not present

## 2018-02-11 DIAGNOSIS — J309 Allergic rhinitis, unspecified: Secondary | ICD-10-CM

## 2018-02-11 DIAGNOSIS — J029 Acute pharyngitis, unspecified: Secondary | ICD-10-CM

## 2018-02-11 LAB — POCT RAPID STREP A (OFFICE): Rapid Strep A Screen: NEGATIVE

## 2018-02-11 MED ORDER — MONTELUKAST SODIUM 10 MG PO TABS: 10.0000 mg | ORAL_TABLET | Freq: Every day | ORAL | 3 refills | Status: DC

## 2018-02-11 NOTE — Assessment & Plan Note (Signed)
Rx for singulair and continue xyzal and flonase as well.

## 2018-02-11 NOTE — Patient Instructions (Signed)
The strep test is negative and this is likely due to allergy drainage.   We are going to add a medicine called singulair for the allergies to help. Keep taking xyzal and flonase also.

## 2018-02-11 NOTE — Assessment & Plan Note (Signed)
No current evidence of sinusitis. No indication for antibiotics or steroids. Rx for singulair to see if we can do better with her allergies which are likely causing her symptoms.

## 2018-02-11 NOTE — Progress Notes (Signed)
   Subjective:    Patient ID: Grace George, female    DOB: 1960/10/03, 57 y.o.   MRN: [id number]  HPI The patient is a 57 YO female coming in for continued fatigue and symptoms. She was treated for sinus infection about 1 month ago with antibiotics. She then came back to see [name] about 1-2 weeks ago for persistent but improving symptoms. She was given depo-medrol injection at that time. She is now having similar symptoms. She is still having sore throat and ear pressure and nose drainage. She is no longer having cough or SOB. She is taking xyzal and flonase. Denies fevers or chills.   Review of Systems  Constitutional: Negative for activity change, appetite change, chills, fatigue, fever and unexpected weight change.  HENT: Positive for congestion, postnasal drip, rhinorrhea and sinus pressure. Negative for ear discharge, ear pain, sinus pain, sneezing, sore throat, tinnitus, trouble swallowing and voice change.   Eyes: Negative.   Respiratory: Negative for cough, chest tightness, shortness of breath and wheezing.   Cardiovascular: Negative.   Gastrointestinal: Negative.   Musculoskeletal: Positive for myalgias.  Neurological: Negative.       Objective:   Physical Exam  Constitutional: She is oriented to person, place, and time. She appears well-developed and well-nourished.  HENT:  Head: Normocephalic and atraumatic.  Oropharynx with redness, nose with swollen turbinates, TMs normal bilaterally  Eyes: EOM are normal.  Neck: Normal range of motion. No thyromegaly present.  Cardiovascular: Normal rate and regular rhythm.  Pulmonary/Chest: Effort normal and breath sounds normal. No respiratory distress. She has no wheezes. She has no rales.  Abdominal: Soft.  Musculoskeletal: She exhibits tenderness.  Lymphadenopathy:    She has no cervical adenopathy.  Neurological: She is alert and oriented to person, place, and time.  Skin: Skin is warm and dry.   Vitals:   02/11/18 1458  BP:  100/60  Pulse: (!) 47  Temp: (!) 97.5 F (36.4 C)  TempSrc: Oral  SpO2: 97%  Weight: 160 lb (72.6 kg)  Height: 5\' 3"  (1.6 m)   POC strep negative    Assessment & Plan:

## 2018-03-12 ENCOUNTER — Ambulatory Visit (INDEPENDENT_AMBULATORY_CARE_PROVIDER_SITE_OTHER): Payer: BC Managed Care – PPO

## 2018-03-12 DIAGNOSIS — Z23 Encounter for immunization: Secondary | ICD-10-CM | POA: Diagnosis not present

## 2018-05-26 ENCOUNTER — Telehealth: Payer: Self-pay

## 2018-05-26 DIAGNOSIS — E559 Vitamin D deficiency, unspecified: Secondary | ICD-10-CM

## 2018-05-26 NOTE — Telephone Encounter (Signed)
Copied from [place] [id number]. Topic: General - Other >> May 26, 2018  9:39 AM Judyann Munson wrote: Reason for CRM:  patient is requesting a  lab order for VITAMIN D. Please advise >> May 26, 2018 10:32 AM Kristine Royal J wrote: Please advise if appt is needed,  Last VIT D check was in 2018, last appt with [place] was 10/19

## 2018-05-28 NOTE — Telephone Encounter (Signed)
Patient informed of MD response  

## 2018-05-28 NOTE — Telephone Encounter (Signed)
Lab placed

## 2018-05-28 NOTE — Addendum Note (Signed)
Addended by: Pricilla Holm A on: 05/28/2018 07:30 AM   Modules accepted: Orders

## 2018-06-03 ENCOUNTER — Other Ambulatory Visit (INDEPENDENT_AMBULATORY_CARE_PROVIDER_SITE_OTHER): Payer: BC Managed Care – PPO

## 2018-06-03 DIAGNOSIS — E559 Vitamin D deficiency, unspecified: Secondary | ICD-10-CM | POA: Diagnosis not present

## 2018-06-03 LAB — VITAMIN D 25 HYDROXY (VIT D DEFICIENCY, FRACTURES): VITD: 24.04 ng/mL — ABNORMAL LOW (ref 30.00–100.00)

## 2018-06-30 ENCOUNTER — Encounter: Payer: Self-pay | Admitting: Internal Medicine

## 2018-06-30 ENCOUNTER — Ambulatory Visit: Payer: BC Managed Care – PPO | Admitting: Internal Medicine

## 2018-06-30 VITALS — BP 102/60 | HR 60 | Ht 63.0 in | Wt 161.0 lb

## 2018-06-30 DIAGNOSIS — Z86010 Personal history of colonic polyps: Secondary | ICD-10-CM | POA: Diagnosis not present

## 2018-06-30 DIAGNOSIS — K5909 Other constipation: Secondary | ICD-10-CM

## 2018-06-30 DIAGNOSIS — R194 Change in bowel habit: Secondary | ICD-10-CM

## 2018-06-30 MED ORDER — NA SULFATE-K SULFATE-MG SULF 17.5-3.13-1.6 GM/177ML PO SOLN: 1.0000 | Freq: Once | ORAL | 0 refills | Status: AC

## 2018-06-30 NOTE — Progress Notes (Signed)
Grace George 58 y.o. 27-Jul-1960 [id number]  Assessment & Plan:   Encounter Diagnoses  Name Primary?  . Change in bowel habits Yes  . Chronic constipation   . Hx of adenomatous polyp of colon     Evaluate w/ colonoscopy Tx w/  MiraLax qd benefiber 1 tbsp  Cc;Crawford, Real Cons, MD  Subjective:   Chief Complaint: worsening constipation  HPI Grace George is here w/ hx chronic constipation saying that in past 4-6 mos produces balls of stool and some straining, new. In past dulcolax helped - took often - still helps but rarely uses. Tried MiraLax in past but its not clear if tool daily or prn. Using some metamucil 2 tsp qd now w/ some help. No fatigue, edema or weight gain. No GU sxs. Had contacted PCP and Linzess rxed but did not take due to fear of side effects  Diet - 6 + glasses H2) daily - apples, grapes, bananas, pineapple but not much vegetables  No new meds, diet or activity changes  Colonoscopy 2012 TV adenoma 2015 no polyps  Allergies  Allergen Reactions  . Eggs Or Egg-Derived Products     Causes a rash, if she eats a lot!  . Sulfa Antibiotics     Rash & itching   Current Meds  Medication Sig  . atenolol (TENORMIN) 50 MG tablet TAKE 1 TABLET BY MOUTH EVERY DAY  . levocetirizine (XYZAL) 5 MG tablet Take 1 tablet (5 mg total) by mouth every evening.  . Multiple Vitamins-Calcium (ONE-A-DAY WOMENS PO) Take by mouth. Alive Womens One  A Day Chewables-Take 2 daily  . pantoprazole (PROTONIX) 40 MG tablet Take 1 tablet (40 mg total) by mouth daily.  . simvastatin (ZOCOR) 40 MG tablet TAKE 1 TABLET (40 MG TOTAL) BY MOUTH DAILY AT 6 PM. PLEASE KEEP OV   Past Medical History:  Diagnosis Date  . Abnormal Pap smear   . Allergic rhinitis   . Allergy   . Anemia   . Anxiety   . CAD (coronary artery disease)   . GERD (gastroesophageal reflux disease)   . History of shingles 1/15  . History of stress test 06/2011   Abnormal Myocadial perfusion scan demomstrating an  attenuation defect in the anterior region of the myocardium, No ischemia or infaract/scar is seenin the remaining myocardium. Excercise capacity 12 METS.  [name] HTN (hypertension)   . Hx of echocardiogram 2009   Showed normal systolic and diastolic function. she does have borderline mitral valve prolapse.  [name] Hyperlipidemia    per pt NO  . Lumbar back pain   . Migraines   . MVP (mitral valve prolapse)   . 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 Post-operative nausea and vomiting    Past Surgical History:  Procedure Laterality Date  . CESAREAN SECTION     twice  . COLONOSCOPY    . CRYOABLATION     times 2 for abn paps  . ESOPHAGOGASTRODUODENOSCOPY    . HYSTEROSCOPY    . WRIST SURGERY     right   Social History   Social History Narrative   Married, 2 kids   Employed - [employer] - in [place]   Never tobacco, drugs   No EtOH   family history includes Breast cancer in her mother; Diabetes in her mother and sister; Heart disease in her brother and mother; Hypertension in her mother and sister; Other in her brother; Prostate cancer in her father.  Review of Systems As above All other ROS negative Objective:   Physical Exam BP 102/60   Pulse 60   Ht 5\' 3"  (1.6 m)   Wt 161 lb (73 kg)   LMP 03/28/2010   BMI 28.52 kg/m  NAD Eyes anicteric Appropriate mood/affect Alert and oriented x 3 No rash seen  Patti Martinique, CMA present.  Abdomen soft and NT  Rectal:  Anoderm inspection revealed no abnormalities Anal wink was + Digital exam revealed normal resting tone and voluntary squeeze. Firm brown stool No mass or rectocele present. Simulated defecation with valsalva revealed appropriate abdominal contraction and descent.

## 2018-06-30 NOTE — Patient Instructions (Signed)
You have been scheduled for a colonoscopy. Please follow written instructions given to you at your visit today.  Please pick up your prep supplies at the pharmacy within the next 1-3 days. If you use inhalers (even only as needed), please bring them with you on the day of your procedure.    Take one dose of Miralax daily, coupon provided.   Take benefiber daily, handout provided.   I appreciate the opportunity to care for you. Silvano Rusk, MD, [facility]

## 2018-07-01 ENCOUNTER — Encounter: Payer: Self-pay | Admitting: Internal Medicine

## 2018-07-06 ENCOUNTER — Encounter: Payer: Self-pay | Admitting: Internal Medicine

## 2018-07-06 ENCOUNTER — Ambulatory Visit (AMBULATORY_SURGERY_CENTER): Payer: BC Managed Care – PPO | Admitting: Internal Medicine

## 2018-07-06 ENCOUNTER — Other Ambulatory Visit: Payer: Self-pay

## 2018-07-06 VITALS — BP 125/72 | HR 72 | Temp 99.1°F | Resp 19 | Ht 63.0 in | Wt 161.0 lb

## 2018-07-06 DIAGNOSIS — R194 Change in bowel habit: Secondary | ICD-10-CM

## 2018-07-06 DIAGNOSIS — K635 Polyp of colon: Secondary | ICD-10-CM | POA: Diagnosis not present

## 2018-07-06 DIAGNOSIS — D125 Benign neoplasm of sigmoid colon: Secondary | ICD-10-CM

## 2018-07-06 DIAGNOSIS — D124 Benign neoplasm of descending colon: Secondary | ICD-10-CM

## 2018-07-06 MED ORDER — SODIUM CHLORIDE 0.9 % IV SOLN: 500.0000 mL | Freq: Once | INTRAVENOUS | Status: DC

## 2018-07-06 NOTE — Patient Instructions (Addendum)
I found and removed 2 tiny polyps. All else ok  Try the Benefiber and MiraLAx as we discussed at the office appointment. If that fails to adequately help after trying for 1-2 months let me know.  I will let you know pathology results and when to have another routine colonoscopy by mail and/or My Chart. Gatha Mayer, MD, FACG     YOU HAD AN ENDOSCOPIC PROCEDURE TODAY AT [facility]:   Refer to the procedure report that was given to you for any specific questions about what was found during the examination.  If the procedure report does not answer your questions, please call your gastroenterologist to clarify.  If you requested that your care partner not be given the details of your procedure findings, then the procedure report has been included in a sealed envelope for you to review at your convenience later.  YOU SHOULD EXPECT: Some feelings of bloating in the abdomen. Passage of more gas than usual.  Walking can help get rid of the air that was put into your GI tract during the procedure and reduce the bloating. If you had a lower endoscopy (such as a colonoscopy or flexible sigmoidoscopy) you may notice spotting of blood in your stool or on the toilet paper. If you underwent a bowel prep for your procedure, you may not have a normal bowel movement for a few days.  Please Note:  You might notice some irritation and congestion in your nose or some drainage.  This is from the oxygen used during your procedure.  There is no need for concern and it should clear up in a day or so.  SYMPTOMS TO REPORT IMMEDIATELY:   Following lower endoscopy (colonoscopy or flexible sigmoidoscopy):  Excessive amounts of blood in the stool  Significant tenderness or worsening of abdominal pains  Swelling of the abdomen that is new, acute  Fever of 100F or higher    For urgent or emergent issues, a gastroenterologist can be reached at any hour by calling [phone].   DIET:   We do recommend a small meal at first, but then you may proceed to your regular diet.  Drink plenty of fluids but you should avoid alcoholic beverages for 24 hours.  ACTIVITY:  You should plan to take it easy for the rest of today and you should NOT DRIVE or use heavy machinery until tomorrow (because of the sedation medicines used during the test).    FOLLOW UP: Our staff will call the number listed on your records the next business day following your procedure to check on you and address any questions or concerns that you may have regarding the information given to you following your procedure. If we do not reach you, we will leave a message.  However, if you are feeling well and you are not experiencing any problems, there is no need to return our call.  We will assume that you have returned to your regular daily activities without incident.  If any biopsies were taken you will be contacted by phone or by letter within the next 1-3 weeks.  Please call us at [phone] if you have not heard about the biopsies in 3 weeks.    SIGNATURES/CONFIDENTIALITY: You and/or your care partner have signed paperwork which will be entered into your electronic medical record.  These signatures attest to the fact that that the information above on your After Visit Summary has been reviewed and is understood.  Full responsibility of  the confidentiality of this discharge information lies with you and/or your care-partner.   Handout was given to your care partner on polyps. Per Dr. Carlean Purl try BENEFIBER and [facility] daily as discussed at 06-30-18 office visit. You may resume your current medications today. Await biopsy results. Please call if any questions or concerns.

## 2018-07-06 NOTE — Progress Notes (Signed)
No problems noted in the recovery room. maw 

## 2018-07-06 NOTE — Progress Notes (Signed)
Called to room to assist during endoscopic procedure.  Patient ID and intended procedure confirmed with present staff. Received instructions for my participation in the procedure from the performing physician.  

## 2018-07-06 NOTE — Op Note (Signed)
[facility] Patient Name: Grace George Procedure Date: 07/06/2018 9:12 AM MRN: [id number] Endoscopist: Gatha Mayer , MD Age: 58 Referring MD:  Date of Birth: [birth year] Gender: Female Account #: [id number] Procedure:                Colonoscopy Indications:              Personal history of colonic polyps, Change in bowel                            habits Medicines:                Propofol per Anesthesia, Monitored Anesthesia Care Procedure:                Pre-Anesthesia Assessment:                           - Prior to the procedure, a History and Physical                            was performed, and patient medications and                            allergies were reviewed. The patient's tolerance of                            previous anesthesia was also reviewed. The risks                            and benefits of the procedure and the sedation                            options and risks were discussed with the patient.                            All questions were answered, and informed consent                            was obtained. Prior Anticoagulants: The patient has                            taken no previous anticoagulant or antiplatelet                            agents. ASA Grade Assessment: II - A patient with                            mild systemic disease. After reviewing the risks                            and benefits, the patient was deemed in                            satisfactory condition to undergo the procedure.  After obtaining informed consent, the colonoscope                            was passed under direct vision. Throughout the                            procedure, the patient's blood pressure, pulse, and                            oxygen saturations were monitored continuously. The                            Colonoscope was introduced through the anus and                            advanced to the the cecum,  identified by                            appendiceal orifice and ileocecal valve. The                            colonoscopy was performed without difficulty. The                            patient tolerated the procedure well. The quality                            of the bowel preparation was excellent. The                            ileocecal valve, appendiceal orifice, and rectum                            were photographed. The bowel preparation used was                            SUPREP. Scope In: 9:17:50 AM Scope Out: 9:39:15 AM Scope Withdrawal Time: 0 hours 14 minutes 55 seconds  Total Procedure Duration: 0 hours 21 minutes 25 seconds  Findings:                 The perianal and digital rectal examinations were                            normal.                           Two flat and sessile polyps were found in the                            sigmoid colon and descending colon. The polyps were                            diminutive in size. These polyps were removed with  a cold snare. Resection and retrieval were                            complete. Verification of patient identification                            for the specimen was done. Estimated blood loss was                            minimal.                           The exam was otherwise without abnormality on                            direct and retroflexion views. Complications:            No immediate complications. Estimated Blood Loss:     Estimated blood loss was minimal. Impression:               - Two diminutive polyps in the sigmoid colon and in                            the descending colon, removed with a cold snare.                            Resected and retrieved.                           - The examination was otherwise normal on direct                            and retroflexion views.                           - Personal history of colonic polyp. 8-9 mmTV                             adenoma 2012 Recommendation:           - Patient has a contact number available for                            emergencies. The signs and symptoms of potential                            delayed complications were discussed with the                            patient. Return to normal activities tomorrow.                            Written discharge instructions were provided to the                            patient.                           -  Resume previous diet.                           - Continue present medications.                           - Repeat colonoscopy is recommended for                            surveillance. The colonoscopy date will be                            determined after pathology results from today's                            exam become available for review.                           - Use Benefiber and MiraLAx daily as discussed at                            3/4 office visit Gatha Mayer, MD 07/06/2018 9:49:54 AM This report has been signed electronically.

## 2018-07-06 NOTE — Progress Notes (Signed)
Spontaneous respirations throughout. VSS. Resting comfortably. To PACU on room air. Report to  RN. 

## 2018-07-07 ENCOUNTER — Telehealth: Payer: Self-pay

## 2018-07-07 NOTE — Telephone Encounter (Signed)
  Follow up Call-  Call back number 07/06/2018  Post procedure Call Back phone  # [phone]  Permission to leave phone message Yes  Some recent data might be hidden     Patient questions:  Do you have a fever, pain , or abdominal swelling? No. Pain Score  0 *  Have you tolerated food without any problems? Yes.    Have you been able to return to your normal activities? Yes.    Do you have any questions about your discharge instructions: Diet   No. Medications  No. Follow up visit  No.  Do you have questions or concerns about your Care? No.  Actions: * If pain score is 4 or above: No action needed, pain <4.

## 2018-07-10 ENCOUNTER — Encounter: Payer: Self-pay | Admitting: Internal Medicine

## 2018-07-10 NOTE — Progress Notes (Signed)
2 hyperplastic polyps left side and diminutive Hx TV adenoma 2012 no polyps 20215 Recall 2027

## 2018-10-06 ENCOUNTER — Ambulatory Visit: Payer: BC Managed Care – PPO | Admitting: Certified Nurse Midwife

## 2018-10-12 ENCOUNTER — Other Ambulatory Visit: Payer: Self-pay | Admitting: Cardiovascular Disease

## 2018-10-15 ENCOUNTER — Telehealth: Payer: Self-pay | Admitting: Cardiovascular Disease

## 2018-10-28 ENCOUNTER — Encounter: Payer: Self-pay | Admitting: Certified Nurse Midwife

## 2018-11-21 ENCOUNTER — Other Ambulatory Visit: Payer: Self-pay | Admitting: Cardiovascular Disease

## 2018-11-29 ENCOUNTER — Ambulatory Visit (INDEPENDENT_AMBULATORY_CARE_PROVIDER_SITE_OTHER): Payer: BC Managed Care – PPO

## 2018-11-29 ENCOUNTER — Other Ambulatory Visit: Payer: Self-pay

## 2018-11-29 ENCOUNTER — Ambulatory Visit: Payer: BC Managed Care – PPO | Admitting: Podiatry

## 2018-11-29 ENCOUNTER — Encounter: Payer: Self-pay | Admitting: Podiatry

## 2018-11-29 VITALS — BP 128/74 | HR 65 | Temp 97.7°F

## 2018-11-29 DIAGNOSIS — G629 Polyneuropathy, unspecified: Secondary | ICD-10-CM | POA: Diagnosis not present

## 2018-11-29 DIAGNOSIS — M779 Enthesopathy, unspecified: Secondary | ICD-10-CM | POA: Diagnosis not present

## 2018-11-29 MED ORDER — DICLOFENAC SODIUM 75 MG PO TBEC: 75.0000 mg | DELAYED_RELEASE_TABLET | Freq: Two times a day (BID) | ORAL | 2 refills | Status: DC

## 2018-12-01 NOTE — Progress Notes (Signed)
Subjective:   Patient ID: Grace George, female   DOB: 58 y.o.   MRN: [id number]   HPI Patient presents stating that she feels like there is numbness and tingling in her toes and the left started first several months ago followed by the right and she does not remember any injury and she has had no balance issues or other pathology.  Patient does not smoke and likes to be active   Review of Systems  All other systems reviewed and are negative.       Objective:  Physical Exam Vitals signs and nursing note reviewed.  Constitutional:      Appearance: She is well-developed.  Pulmonary:     Effort: Pulmonary effort is normal.  Musculoskeletal: Normal range of motion.  Skin:    General: Skin is warm.  Neurological:     Mental Status: She is alert.     Vascular status found to be intact and I did numerous testing for neurological deficit and I did not note any neurological deficit with sharp dull vibratory normal with patient having good range of motion and muscle strength with no indication of muscle weakness.  Patient does have good digital perfusion well oriented x3     Assessment:  Difficult to say why she may be experiencing this but I do not currently see any form of organic cause     Plan:  H&P x-rays reviewed and I discussed doing some soaks for her feet and placed her on her oral anti-inflammatory in case there is an inflammatory component.  I did discuss if this does not improve in the next few months that we may want to consider neurological consult and nerve conduction studies but hopefully it will resolve on its own and also I did discuss that she may have some form of back compression which could be contributory.  Patient is encouraged to get an A1c done as she is not had it done for a while and is due for a physical and we do want to see the results of her blood work  X-rays indicate that there is no indications of pathology with the patient found to have good bone  strength and position

## 2018-12-02 ENCOUNTER — Ambulatory Visit: Payer: BC Managed Care – PPO | Admitting: Cardiology

## 2018-12-03 ENCOUNTER — Ambulatory Visit: Payer: BC Managed Care – PPO | Admitting: Cardiology

## 2018-12-07 ENCOUNTER — Ambulatory Visit (INDEPENDENT_AMBULATORY_CARE_PROVIDER_SITE_OTHER): Payer: BC Managed Care – PPO | Admitting: Internal Medicine

## 2018-12-07 ENCOUNTER — Encounter: Payer: Self-pay | Admitting: Internal Medicine

## 2018-12-07 ENCOUNTER — Other Ambulatory Visit (INDEPENDENT_AMBULATORY_CARE_PROVIDER_SITE_OTHER): Payer: BC Managed Care – PPO

## 2018-12-07 ENCOUNTER — Other Ambulatory Visit: Payer: Self-pay

## 2018-12-07 VITALS — BP 120/82 | HR 54 | Temp 97.8°F | Ht 63.0 in | Wt 162.0 lb

## 2018-12-07 DIAGNOSIS — Z23 Encounter for immunization: Secondary | ICD-10-CM | POA: Diagnosis not present

## 2018-12-07 DIAGNOSIS — Z0000 Encounter for general adult medical examination without abnormal findings: Secondary | ICD-10-CM

## 2018-12-07 DIAGNOSIS — I10 Essential (primary) hypertension: Secondary | ICD-10-CM

## 2018-12-07 DIAGNOSIS — K219 Gastro-esophageal reflux disease without esophagitis: Secondary | ICD-10-CM | POA: Diagnosis not present

## 2018-12-07 DIAGNOSIS — F418 Other specified anxiety disorders: Secondary | ICD-10-CM

## 2018-12-07 DIAGNOSIS — E7800 Pure hypercholesterolemia, unspecified: Secondary | ICD-10-CM

## 2018-12-07 LAB — HEMOGLOBIN A1C: Hgb A1c MFr Bld: 5.5 % (ref 4.6–6.5)

## 2018-12-07 LAB — CBC
HCT: 40.5 % (ref 36.0–46.0)
Hemoglobin: 13.4 g/dL (ref 12.0–15.0)
MCHC: 33.2 g/dL (ref 30.0–36.0)
MCV: 90 fl (ref 78.0–100.0)
Platelets: 144 10*3/uL — ABNORMAL LOW (ref 150.0–400.0)
RBC: 4.5 Mil/uL (ref 3.87–5.11)
RDW: 13.5 % (ref 11.5–15.5)
WBC: 3.8 10*3/uL — ABNORMAL LOW (ref 4.0–10.5)

## 2018-12-07 LAB — COMPREHENSIVE METABOLIC PANEL
ALT: 21 U/L (ref 0–35)
AST: 21 U/L (ref 0–37)
Albumin: 4.5 g/dL (ref 3.5–5.2)
Alkaline Phosphatase: 83 U/L (ref 39–117)
BUN: 8 mg/dL (ref 6–23)
CO2: 29 mEq/L (ref 19–32)
Calcium: 10 mg/dL (ref 8.4–10.5)
Chloride: 106 mEq/L (ref 96–112)
Creatinine, Ser: 0.78 mg/dL (ref 0.40–1.20)
GFR: 91.3 mL/min (ref >60.00)
Glucose, Bld: 97 mg/dL (ref 70–99)
Potassium: 5.1 mEq/L (ref 3.5–5.1)
Sodium: 141 mEq/L (ref 135–145)
Total Bilirubin: 0.9 mg/dL (ref 0.2–1.2)
Total Protein: 7.2 g/dL (ref 6.0–8.3)

## 2018-12-07 LAB — LIPID PANEL
Cholesterol: 130 mg/dL (ref 0–200)
HDL: 65.7 mg/dL (ref >39.00)
LDL Cholesterol: 54 mg/dL (ref 0–99)
NonHDL: 64.05
Total CHOL/HDL Ratio: 2
Triglycerides: 52 mg/dL (ref 0.0–149.0)
VLDL: 10.4 mg/dL (ref 0.0–40.0)

## 2018-12-07 LAB — TSH: TSH: 3.28 u[IU]/mL (ref 0.35–4.50)

## 2018-12-07 LAB — T4, FREE: Free T4: 0.74 ng/dL (ref 0.60–1.60)

## 2018-12-07 LAB — VITAMIN D 25 HYDROXY (VIT D DEFICIENCY, FRACTURES): VITD: 40.28 ng/mL (ref 30.00–100.00)

## 2018-12-07 LAB — VITAMIN B12: Vitamin B-12: 235 pg/mL (ref 211–911)

## 2018-12-07 NOTE — Progress Notes (Signed)
   Subjective:   Patient ID: Grace George, female    DOB: 12-May-1960, 58 y.o.   MRN: [id number]  HPI The patient is a 58 YO female coming in for physical.  PMH, [facility], social history reviewed and updated.   Review of Systems  Constitutional: Negative.   HENT: Negative.   Eyes: Negative.   Respiratory: Negative for cough, chest tightness and shortness of breath.   Cardiovascular: Negative for chest pain, palpitations and leg swelling.  Gastrointestinal: Negative for abdominal distention, abdominal pain, constipation, diarrhea, nausea and vomiting.  Musculoskeletal: Negative.   Skin: Negative.   Neurological: Negative.   Psychiatric/Behavioral: Negative.     Objective:  Physical Exam Constitutional:      Appearance: She is well-developed.  HENT:     Head: Normocephalic and atraumatic.  Neck:     Musculoskeletal: Normal range of motion.  Cardiovascular:     Rate and Rhythm: Normal rate and regular rhythm.  Pulmonary:     Effort: Pulmonary effort is normal. No respiratory distress.     Breath sounds: Normal breath sounds. No wheezing or rales.  Abdominal:     General: Bowel sounds are normal. There is no distension.     Palpations: Abdomen is soft.     Tenderness: There is no abdominal tenderness. There is no rebound.  Skin:    General: Skin is warm and dry.  Neurological:     Mental Status: She is alert and oriented to person, place, and time.     Coordination: Coordination normal.     Vitals:   12/07/18 0819  BP: 120/82  Pulse: (!) 54  Temp: 97.8 F (36.6 C)  TempSrc: Oral  SpO2: 99%  Weight: 162 lb (73.5 kg)  Height: 5\' 3"  (1.6 m)    Assessment & Plan:  Shingrix IM given at visit

## 2018-12-07 NOTE — Assessment & Plan Note (Signed)
Flu shot yearly. Shingrix given 1st today. Tetanus up to date. Colonoscopy up to date. Mammogram up to date, pap smear up to date. Counseled about sun safety and mole surveillance. Counseled about the dangers of distracted driving. Given 10 year screening recommendations.

## 2018-12-07 NOTE — Assessment & Plan Note (Signed)
Checking lipid panel and adjust simvastatin 40 mg daily as needed.  

## 2018-12-07 NOTE — Patient Instructions (Signed)
We have given you the shingles vaccine today.   Come back in 2 months and we will give you the second (and last) shingles vaccine as well as the flu shot.    Health Maintenance, Female Adopting a healthy lifestyle and getting preventive care are important in promoting health and wellness. Ask your health care provider about:  The right schedule for you to have regular tests and exams.  Things you can do on your own to prevent diseases and keep yourself healthy. What should I know about diet, weight, and exercise? Eat a healthy diet   Eat a diet that includes plenty of vegetables, fruits, low-fat dairy products, and lean protein.  Do not eat a lot of foods that are high in solid fats, added sugars, or sodium. Maintain a healthy weight Body mass index (BMI) is used to identify weight problems. It estimates body fat based on height and weight. Your health care provider can help determine your BMI and help you achieve or maintain a healthy weight. Get regular exercise Get regular exercise. This is one of the most important things you can do for your health. Most adults should:  Exercise for at least 150 minutes each week. The exercise should increase your heart rate and make you sweat (moderate-intensity exercise).  Do strengthening exercises at least twice a week. This is in addition to the moderate-intensity exercise.  Spend less time sitting. Even light physical activity can be beneficial. Watch cholesterol and blood lipids Have your blood tested for lipids and cholesterol at 59 years of age, then have this test every 5 years. Have your cholesterol levels checked more often if:  Your lipid or cholesterol levels are high.  You are older than 59 years of age.  You are at high risk for heart disease. What should I know about cancer screening? Depending on your health history and family history, you may need to have cancer screening at various ages. This may include screening for:   Breast cancer.  Cervical cancer.  Colorectal cancer.  Skin cancer.  Lung cancer. What should I know about heart disease, diabetes, and high blood pressure? Blood pressure and heart disease  High blood pressure causes heart disease and increases the risk of stroke. This is more likely to develop in people who have high blood pressure readings, are of African descent, or are overweight.  Have your blood pressure checked: ? Every 3-5 years if you are 63-26 years of age. ? Every year if you are 73 years old or older. Diabetes Have regular diabetes screenings. This checks your fasting blood sugar level. Have the screening done:  Once every three years after age 30 if you are at a normal weight and have a low risk for diabetes.  More often and at a younger age if you are overweight or have a high risk for diabetes. What should I know about preventing infection? Hepatitis B If you have a higher risk for hepatitis B, you should be screened for this virus. Talk with your health care provider to find out if you are at risk for hepatitis B infection. Hepatitis C Testing is recommended for:  Everyone born from [birth year] through 1966.  Anyone with known risk factors for hepatitis C. Sexually transmitted infections (STIs)  Get screened for STIs, including gonorrhea and chlamydia, if: ? You are sexually active and are younger than 59 years of age. ? You are older than 59 years of age and your health care provider tells you that you  are at risk for this type of infection. ? Your sexual activity has changed since you were last screened, and you are at increased risk for chlamydia or gonorrhea. Ask your health care provider if you are at risk.  Ask your health care provider about whether you are at high risk for HIV. Your health care provider may recommend a prescription medicine to help prevent HIV infection. If you choose to take medicine to prevent HIV, you should first get tested for HIV. You  should then be tested every 3 months for as long as you are taking the medicine. Pregnancy  If you are about to stop having your period (premenopausal) and you may become pregnant, seek counseling before you get pregnant.  Take 400 to 800 micrograms (mcg) of folic acid every day if you become pregnant.  Ask for birth control (contraception) if you want to prevent pregnancy. Osteoporosis and menopause Osteoporosis is a disease in which the bones lose minerals and strength with aging. This can result in bone fractures. If you are 44 years old or older, or if you are at risk for osteoporosis and fractures, ask your health care provider if you should:  Be screened for bone loss.  Take a calcium or vitamin D supplement to lower your risk of fractures.  Be given hormone replacement therapy (HRT) to treat symptoms of menopause. Follow these instructions at home: Lifestyle  Do not use any products that contain nicotine or tobacco, such as cigarettes, e-cigarettes, and chewing tobacco. If you need help quitting, ask your health care provider.  Do not use street drugs.  Do not share needles.  Ask your health care provider for help if you need support or information about quitting drugs. Alcohol use  Do not drink alcohol if: ? Your health care provider tells you not to drink. ? You are pregnant, may be pregnant, or are planning to become pregnant.  If you drink alcohol: ? Limit how much you use to 0-1 drink a day. ? Limit intake if you are breastfeeding.  Be aware of how much alcohol is in your drink. In the [place] one drink equals one 12 oz bottle of beer (355 mL), one 5 oz glass of wine (148 mL), or one 1 oz glass of hard liquor (44 mL). General instructions  Schedule regular health, dental, and eye exams.  Stay current with your vaccines.  Tell your health care provider if: ? You often feel depressed. ? You have ever been abused or do not feel safe at home. Summary  Adopting a  healthy lifestyle and getting preventive care are important in promoting health and wellness.  Follow your health care provider's instructions about healthy diet, exercising, and getting tested or screened for diseases.  Follow your health care provider's instructions on monitoring your cholesterol and blood pressure. This information is not intended to replace advice given to you by your health care provider. Make sure you discuss any questions you have with your health care provider. Document Released: 10/28/2010 Document Revised: 04/07/2018 Document Reviewed: 04/07/2018 Elsevier Patient Education  2020 [employer].

## 2018-12-07 NOTE — Assessment & Plan Note (Signed)
Using protonix and rare symptoms. Will continue to use prn.

## 2018-12-07 NOTE — Assessment & Plan Note (Signed)
Some worse with pandemic but she feels she is coping okay.

## 2018-12-07 NOTE — Assessment & Plan Note (Signed)
BP at goal on atenolol. Checking CMP and adjust as needed. HR okay.

## 2019-01-06 ENCOUNTER — Other Ambulatory Visit: Payer: Self-pay | Admitting: Cardiovascular Disease

## 2019-01-06 ENCOUNTER — Telehealth: Payer: Self-pay

## 2019-01-06 NOTE — Telephone Encounter (Signed)
Called and spoke with patient and results given. Mailed out copy of labs to her per her request.

## 2019-01-07 ENCOUNTER — Encounter: Payer: Self-pay | Admitting: Internal Medicine

## 2019-01-07 ENCOUNTER — Telehealth: Payer: Self-pay | Admitting: Internal Medicine

## 2019-01-07 NOTE — Telephone Encounter (Signed)
Pt called in to be advised. She said that she was told to start taking B-12 100MG , pt says that she doesn't see that MG in the stores. Pt would like further assistance with this    CB: [phone]

## 2019-01-07 NOTE — Telephone Encounter (Signed)
LVM that patient should be able to find this in stores and if her particular store doesn't carry it she can try another store.

## 2019-01-10 ENCOUNTER — Other Ambulatory Visit: Payer: Self-pay | Admitting: Internal Medicine

## 2019-01-10 DIAGNOSIS — J309 Allergic rhinitis, unspecified: Secondary | ICD-10-CM

## 2019-01-14 ENCOUNTER — Other Ambulatory Visit: Payer: Self-pay

## 2019-01-14 ENCOUNTER — Ambulatory Visit (INDEPENDENT_AMBULATORY_CARE_PROVIDER_SITE_OTHER): Payer: BC Managed Care – PPO

## 2019-01-14 DIAGNOSIS — Z23 Encounter for immunization: Secondary | ICD-10-CM

## 2019-01-22 ENCOUNTER — Other Ambulatory Visit: Payer: Self-pay | Admitting: Cardiovascular Disease

## 2019-02-03 ENCOUNTER — Ambulatory Visit: Payer: BC Managed Care – PPO | Admitting: Cardiovascular Disease

## 2019-02-03 ENCOUNTER — Encounter

## 2019-02-03 ENCOUNTER — Other Ambulatory Visit: Payer: Self-pay

## 2019-02-03 ENCOUNTER — Encounter: Payer: Self-pay | Admitting: Cardiovascular Disease

## 2019-02-03 DIAGNOSIS — I10 Essential (primary) hypertension: Secondary | ICD-10-CM | POA: Diagnosis not present

## 2019-02-03 DIAGNOSIS — R002 Palpitations: Secondary | ICD-10-CM

## 2019-02-03 DIAGNOSIS — E7800 Pure hypercholesterolemia, unspecified: Secondary | ICD-10-CM | POA: Diagnosis not present

## 2019-02-03 DIAGNOSIS — I491 Atrial premature depolarization: Secondary | ICD-10-CM | POA: Diagnosis not present

## 2019-02-03 DIAGNOSIS — J302 Other seasonal allergic rhinitis: Secondary | ICD-10-CM

## 2019-02-03 NOTE — Patient Instructions (Addendum)
Follow-Up: You will need a follow up appointment in 12 months.  Please call our office 2 months in advance, august 2021 to schedule this appointment.  You may see Shelva Majestic, MD or one of the following Advanced Practice Providers on your designated Care Team:  Almyra Deforest, PA-C  Fabian Sharp, [place]       Medication Instructions:  The current medical regimen is effective;  continue present plan and medications as directed. Please refer to the Current Medication list given to you today. If you need a refill on your cardiac medications before your next appointment, please call your pharmacy. Labwork: When you have labs (blood work) and your tests are completely normal, you will receive your results ONLY by [place] (if you have MyChart) -OR- A paper copy in the mail.  At [facility], you and your health needs are our priority.  As part of our continuing mission to provide you with exceptional heart care, we have created designated Provider Care Teams.  These Care Teams include your primary Cardiologist (physician) and Advanced Practice Providers (APPs -  Physician Assistants and Nurse Practitioners) who all work together to provide you with the care you need, when you need it.  Thank you for choosing [facility] at [facility]!!

## 2019-02-03 NOTE — Progress Notes (Signed)
Patient ID: Grace George, female   DOB: 1960/12/15, 58 y.o.   MRN: [id number]     HPI: Grace George is a 58 y.o. female who presents to the office for a 16 month follow-up cardiology evaluation.  Grace George has a history of hypertension, hyperlipidemia, and palpitations and has been treated with beta blocker therapy.  An echo Doppler study in 2009, showed normal systolic and diastolic function and she did have borderline mitral valve prolapse.  She has been on atenolol 50 mg daily for her palpitations.  She also has been on simvastatin 40 mg for hyperlipidemia.  Laboratory on 08/02/2014  revealed a hemoglobin of 13.1, hematocrit 39.9.  A competent metabolic panel was entirely normal.  She had normal renal function and LFTs.  Lipid studies were excellent with a total cholesterol 133, triglycerides 60, HDL 60, and LDL 61.  TSH was normal.  In 2017 she went to the emergency room complaints of shortness of breath.  She denied any exertional symptoms.  Previously, she had been walking on a treadmill for at least 35-40 minutes without discomfort. It was felt that possibly her symptoms were due to allergies.  Since I last saw her, she underwent an echo Doppler study on 10/03/2015.  This revealed normal LV function, infection fraction of 55% without regional wall motion and her maladies.  There was grade 2 diastolic dysfunction.  There was flattening mitral valve closure without frank prolapse and trivial TR.  She had normal pulmonary pressures.   I last saw her in May 2018, she has continued to do well from a cardiac standpoint.  Typically her blood pressures have ranged in the 250 range systolically.  She has been maintained on a atenolol 50 mg daily with control of palpitations.  She is on simvastatin 40 mg for hyperlipidemia.  She was recently started on Linzess for chronic constipation but does not seem to have significant benefit from this but was just initiated.  She was seen by her primary physician  earlier in the year with some situational depression.  Her husband was diagnosed with cancer.   Since I last saw her, she states she has been doing fairly well from a cardiac standpoint.  However, she still admits to typically anxiety and stress mediated palpitations.  She denies any sustained runs but typically these more are isolated.  She denies any chest tightness PND orthopnea presyncope or syncope.  At times she admits to occasional numbness of her feet.  There is no history of diabetes mellitus.  She recently had laboratory on December 07, 2018 by her primary physician.  Hemoglobin A1c was stable at 5.5.  Her vitamin D had increased to 40.28.  Thyroid function studies, chemistry and CBC studies were stable.  She presents for evaluation  Past Medical History:  Diagnosis Date  . Abnormal Pap smear   . Allergic rhinitis   . Allergy   . Anemia   . Anxiety   . CAD (coronary artery disease)   . GERD (gastroesophageal reflux disease)   . History of shingles 1/15  . History of stress test 06/2011   Abnormal Myocadial perfusion scan demomstrating an attenuation defect in the anterior region of the myocardium, No ischemia or infaract/scar is seenin the remaining myocardium. Excercise capacity 12 METS.  [name] HTN (hypertension)   . Hx of echocardiogram 2009   Showed normal systolic and diastolic function. she does have borderline mitral valve prolapse.  [name] Hyperlipidemia    per pt NO  .  Lumbar back pain   . Migraines   . MVP (mitral valve prolapse)   . 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 Post-operative nausea and vomiting     Past Surgical History:  Procedure Laterality Date  . CESAREAN SECTION     twice  . COLONOSCOPY    . CRYOABLATION     times 2 for abn paps  . ESOPHAGOGASTRODUODENOSCOPY    . HYSTEROSCOPY    . WRIST SURGERY     right    Allergies  Allergen Reactions  . Eggs Or Egg-Derived Products     Causes a rash, if she eats a lot!  . Sulfa  Antibiotics     Rash & itching    Current Outpatient Medications  Medication Sig Dispense Refill  . atenolol (TENORMIN) 50 MG tablet TAKE 1 TABLET BY MOUTH DAILY. OFFICE VISIT NEEDED 90 tablet 0  . diclofenac (VOLTAREN) 75 MG EC tablet Take 1 tablet (75 mg total) by mouth 2 (two) times daily. 50 tablet 2  . levocetirizine (XYZAL) 5 MG tablet TAKE 1 TABLET BY MOUTH EVERY DAY IN THE EVENING 90 tablet 3  . Multiple Vitamins-Calcium (ONE-A-DAY WOMENS PO) Take by mouth. Alive Womens One  A Day Chewables-Take 2 daily    . pantoprazole (PROTONIX) 40 MG tablet Take 1 tablet (40 mg total) by mouth daily. 90 tablet 1  . simvastatin (ZOCOR) 40 MG tablet TAKE 1 TABLET (40 MG TOTAL) BY MOUTH DAILY AT 6 PM. PLEASE KEEP OV 90 tablet 0   No current facility-administered medications for this visit.     Social History   Socioeconomic History  . Marital status: Married    Spouse name: Not on file  . Number of children: 2  . Years of education: Not on file  . Highest education level: Not on file  Occupational History    Employer: [employer]  . Financial resource strain: Not on file  . Food insecurity    Worry: Not on file    Inability: Not on file  . Transportation needs    Medical: Not on file    Non-medical: Not on file  Tobacco Use  . Smoking status: Never Smoker  . Smokeless tobacco: Never Used  Substance and Sexual Activity  . Alcohol use: No    Alcohol/week: 0.0 standard drinks  . Drug use: No  . Sexual activity: Not Currently    Partners: Male    Birth control/protection: Post-menopausal  Lifestyle  . Physical activity    Days per week: Not on file    Minutes per session: Not on file  . Stress: Not on file  Relationships  . Social Herbalist on phone: Not on file    Gets together: Not on file    Attends religious service: Not on file    Active member of club or organization: Not on file    Attends meetings of clubs or organizations: Not on file     Relationship status: Not on file  . Intimate partner violence    Fear of current or ex partner: Not on file    Emotionally abused: Not on file    Physically abused: Not on file    Forced sexual activity: Not on file  Other Topics Concern  . Not on file  Social History Narrative   Married, 2 kids   Employed - [employer] - in [place]   Never tobacco,  drugs   No EtOH   Updated social history:  she is married and has 2 children, currently age 63 and 87.  She has 2 grandchildren who are twins.  There is no tobacco or alcohol use.  Of note, her brother underwent cardiac transplantation .   Family History  Problem Relation Age of Onset  . Breast cancer Mother   . Heart disease Mother   . Diabetes Mother   . Hypertension Mother   . Prostate cancer Father   . Diabetes Sister   . Hypertension Sister   . Heart disease Brother   . Other Brother        CHF, had heart transplant    ROS General: Negative; No fevers, chills, or night sweats;  HEENT: Negative; No changes in vision or hearing, sinus congestion, difficulty swallowing Pulmonary:  mild shortness of breath, felt to be due to allergies Cardiovascular: Negative; No chest pain, presyncope, syncope, palpitations GI: History of GERD. GU: Negative; No dysuria, hematuria, or difficulty voiding Immunologic: Seasonal allergies Musculoskeletal: Negative; no myalgias, joint pain, or weakness Hematologic/Oncology: Negative; no easy bruising, bleeding Endocrine: Negative; no heat/cold intolerance; no diabetes Neuro: Occasional foot numbness Skin: Negative; No rashes or skin lesions Psychiatric: Negative; No behavioral problems, depression Sleep: Negative; No snoring, daytime sleepiness, hypersomnolence, bruxism, restless legs, hypnogognic hallucinations, no cataplexy Other comprehensive 14 point system review is negative.   PE BP 125/74   Pulse (!) 53   Ht '5\' 3"'  (1.6 m)   Wt 161 lb (73 kg)   LMP 03/28/2010    SpO2 100%   BMI 28.52 kg/m    Repeat blood pressure by me was 134/76  Wt Readings from Last 3 Encounters:  02/03/19 161 lb (73 kg)  12/07/18 162 lb (73.5 kg)  07/06/18 161 lb (73 kg)   General: Alert, oriented, no distress.  Skin: normal turgor, no rashes, warm and dry HEENT: Normocephalic, atraumatic. Pupils equal round and reactive to light; sclera anicteric; extraocular muscles intact;  Nose without nasal septal hypertrophy Mouth/Parynx benign; Mallinpatti scale 2 Neck: No JVD, no carotid bruits; normal carotid upstroke Lungs: clear to ausculatation and percussion; no wheezing or rales Chest wall: without tenderness to palpitation Heart: PMI not displaced, sinus bradycardia with an occasional premature beat, s1 s2 normal, 1/6 systolic murmur, no diastolic murmur, no rubs, gallops, thrills, or heaves Abdomen: soft, nontender; no hepatosplenomehaly, BS+; abdominal aorta nontender and not dilated by palpation. Back: no CVA tenderness Pulses 2+ Musculoskeletal: full range of motion, normal strength, no joint deformities Extremities: no clubbing cyanosis or edema, Homan's sign negative  Neurologic: grossly nonfocal; Cranial nerves grossly wnl Psychologic: Normal mood and affect    ECG (independently read by me): Sinus Bradycardia at 53, PAV First degree AV block; PR 222 ms  June 2019 ECG (independently read by me): Sinus bradycardia 52 bpm.  Nonspecific T change.  Normal intervals.  Borderline first-degree AV block with a PR interval of 204 ms.  May 2018 ECG (independently read by me): Sinus bradycardia 54 bpm..  Borderline First-degree AV block with a PR interval of 202 ms.  May 2017 ECG (independently read by me): Sinus bradycardia 55 bpm.  First degree AV block with a PR interval of 204 ms.  April 2016 ECG (independently read by me): Normal sinus rhythm.  Mild RV conduction delay with T-wave inversion V1 and V2, isolated PAC.  Borderline first-degree AV block with a PR interval  at 22 ms.  April 2015 ECG (independently read by me): Sinus  rhythm at 72 beats per minute with mild RV conduction delay.  She is previously noted T-wave inversion in leads V1 through V3.  She also has occasional PACs with different morphology to the P wave in these premature complexes.  QTc interval 47 ms.  PR interval 188 ms.  LABS:  BMP Latest Ref Rng & Units 12/07/2018 10/16/2017 08/29/2016  Glucose 70 - 99 mg/dL 97 82 88  BUN 6 - 23 mg/dL '8 14 9  ' Creatinine 0.40 - 1.20 mg/dL 0.78 0.72 0.66  BUN/Creat Ratio 9 - 23 - 19 -  Sodium 135 - 145 mEq/L 141 143 142  Potassium 3.5 - 5.1 mEq/L 5.1 4.3 4.8  Chloride 96 - 112 mEq/L 106 106 106  CO2 19 - 32 mEq/L '29 23 27  ' Calcium 8.4 - 10.5 mg/dL 10.0 9.5 9.7     Hepatic Function Latest Ref Rng & Units 12/07/2018 10/16/2017 08/29/2016  Total Protein 6.0 - 8.3 g/dL 7.2 6.6 7.0  Albumin 3.5 - 5.2 g/dL 4.5 4.6 4.2  AST 0 - 37 U/L '21 23 25  ' ALT 0 - 35 U/L '21 26 25  ' Alk Phosphatase 39 - 117 U/L 83 90 92  Total Bilirubin 0.2 - 1.2 mg/dL 0.9 0.7 0.6     CBC Latest Ref Rng & Units 12/07/2018 10/16/2017 08/29/2016  WBC 4.0 - 10.5 K/uL 3.8(L) 3.8 3.6(L)  Hemoglobin 12.0 - 15.0 g/dL 13.4 13.3 13.4  Hematocrit 36.0 - 46.0 % 40.5 39.6 41.2  Platelets 150.0 - 400.0 K/uL 144.0(L) 162 181   Lab Results  Component Value Date   TSH 3.28 12/07/2018    BNP No results found for: PROBNP  Lipid Panel     Component Value Date/Time   CHOL 130 12/07/2018 0909   CHOL 131 10/16/2017 0804   TRIG 52.0 12/07/2018 0909   HDL 65.70 12/07/2018 0909   HDL 68 10/16/2017 0804   CHOLHDL 2 12/07/2018 0909   VLDL 10.4 12/07/2018 0909   LDLCALC 54 12/07/2018 0909   LDLCALC 54 10/16/2017 0804     RADIOLOGY: No results found.  IMPRESSION:  1. Palpitations   2. PAC (premature atrial contraction)   3. Essential hypertension   4. HYPERCHOLESTEROLEMIA   5. Other seasonal allergic rhinitis     ASSESSMENT AND PLAN: Ms. Grace George is a very pleasant 58 year-old  female who has a history of hypertension, hyperlipidemia, and palpitations.  Her palpitations have stabilized on a atenolol.  When I last saw her in June 2019 she had sinus bradycardia at 52 bpm.  Her ECG today reveals sinus bradycardia at 53 bpm.  There was an isolated PAC.  On auscultation she did have an occasional ectopic complex which I suspect are most likely PACs.  I did not hear any runs or bursts of SVT.  She admits that these typically seem to be stress mediated and when she does take it as needed Xanax they seem to resolve.  She has a stable blood pressure on exam today and continues to take atenolol 50 mg daily.  I have stated that if she does have a day where she does note increased palpitations is possible to take an extra 12.5 mg as needed.  She continues to take simvastatin for hyperlipidemia.  Most recent lipid studies were reviewed and are excellent with a total cholesterol of 130, triglycerides 52, HDL 65, and LDL 54.  She takes Xyzal on a daily basis for allergies.  She rarely takes pantoprazole on an as-needed basis for  GERD.  We discussed the importance of continued exercise.  In the past, she had had some issues with situational depression particularly after her husband was diagnosed with cancer.  I recommended reduction of caffeine intake and avoidance of pseudoephedrine preparations.  Hemoglobin A1c is excellent.  I suspect her foot neuropathy potentially could be positional versus early peripheral neuropathy.  If symptoms persist she will follow-up with her primary physician or obtain neurologic assessment.  Distal pulses are excellent.  I will see her in 1 year for reevaluation or sooner if problems arise.   Presently her ECG shows sinus bradycardia at 52 bpm.  She is asymptomatic with reference to her slow heart rate or recurrent palpitations.  Her blood pressure typically has run in the 102T systolically.  Blood pressure elevated upon initial presentation today but did improve with  subsequent assessment.  He has GERD and has been on Protonix 40 mg daily.  Recently she was having difficulty with constipation and was given a trial of Linzess.  She is not certain if this has been working.  She continues to take simvastatin 40 mg daily.  Reviewed lab work from last week.  TSH is normal at 3.61.  Chemistry profile was normal.  She was not anemic.  Lipid studies were excellent with a total cholesterol 131, triglycerides 43, HDL 68, and LDL 54.  She has been walking several days per week.  I discussed the potential benefit of increasing activity to at least 5 days/week for 30 minutes at a time per current guideline recommendations.  She continues to take Xyzal for allergies.  As long as she remains stable, I will see her in 1 year for reevaluation.  Time spent: 25 minutes  Troy Sine, MD, [facility]  02/03/2019 9:20 AM

## 2019-02-07 ENCOUNTER — Other Ambulatory Visit: Payer: Self-pay

## 2019-02-07 ENCOUNTER — Ambulatory Visit (INDEPENDENT_AMBULATORY_CARE_PROVIDER_SITE_OTHER): Payer: BC Managed Care – PPO | Admitting: *Deleted

## 2019-02-07 DIAGNOSIS — Z23 Encounter for immunization: Secondary | ICD-10-CM | POA: Diagnosis not present

## 2019-02-16 ENCOUNTER — Other Ambulatory Visit: Payer: Self-pay | Admitting: Cardiovascular Disease

## 2019-02-17 ENCOUNTER — Other Ambulatory Visit: Payer: Self-pay

## 2019-02-17 DIAGNOSIS — Z20822 Contact with and (suspected) exposure to covid-19: Secondary | ICD-10-CM

## 2019-02-19 LAB — NOVEL CORONAVIRUS, NAA: SARS-CoV-2, NAA: NOT DETECTED

## 2019-03-21 ENCOUNTER — Telehealth: Payer: BC Managed Care – PPO | Admitting: Internal Medicine

## 2019-04-14 ENCOUNTER — Other Ambulatory Visit: Payer: Self-pay

## 2019-04-15 ENCOUNTER — Ambulatory Visit: Payer: BC Managed Care – PPO | Admitting: Certified Nurse Midwife

## 2019-04-15 ENCOUNTER — Encounter: Payer: Self-pay | Admitting: Certified Nurse Midwife

## 2019-04-15 ENCOUNTER — Other Ambulatory Visit: Payer: Self-pay

## 2019-04-15 VITALS — BP 110/70 | HR 68 | Temp 97.8°F | Ht 62.75 in | Wt 160.0 lb

## 2019-04-15 DIAGNOSIS — Z01419 Encounter for gynecological examination (general) (routine) without abnormal findings: Secondary | ICD-10-CM

## 2019-04-15 NOTE — Progress Notes (Signed)
58 y.o. [id number] Married  African American Fe here for annual exam. Post menopausal denies vaginal bleeding or vaginal dryness.  Working at home and at work, coping well with the [place]. Sees PCP Dr. Sharlet Salina for aex/hypertension/cholesterol and GERD management with labs. All stable per patient. No health issues today.  Patient's last menstrual period was 03/28/2010.          Sexually active: No.  The current method of family planning is post menopausal status.    Exercising: No.  exercise Smoker:  no  Review of Systems  Constitutional: Negative.   HENT: Negative.   Eyes: Negative.   Respiratory: Negative.   Cardiovascular: Negative.   Gastrointestinal: Negative.   Genitourinary: Negative.   Musculoskeletal: Negative.   Skin: Negative.   Neurological: Negative.   Endo/Heme/Allergies: Negative.   Psychiatric/Behavioral: Negative.     Health Maintenance: Pap:  08-13-16 neg HPV HR neg History of Abnormal Pap: cryo yrs ago MMG:  10-28-2018 category c density birads 1:neg Self Breast exams: yes Colonoscopy:  2020  f/u 68yrs BMD:  none TDaP: 2015 Shingles: 2020 Pneumonia: not done Hep C and HIV: HIV neg 2019, hep c neg 2017 Labs: PCP   reports that she has never smoked. She has never used smokeless tobacco. She reports that she does not drink alcohol or use drugs.  Past Medical History:  Diagnosis Date  . Abnormal Pap smear   . Allergic rhinitis   . Allergy   . Anemia   . Anxiety   . CAD (coronary artery disease)   . GERD (gastroesophageal reflux disease)   . History of shingles 1/15  . History of stress test 06/2011   Abnormal Myocadial perfusion scan demomstrating an attenuation defect in the anterior region of the myocardium, No ischemia or infaract/scar is seenin the remaining myocardium. Excercise capacity 12 METS.  [name] HTN (hypertension)   . Hx of echocardiogram 2009   Showed normal systolic and diastolic function. she does have borderline mitral valve prolapse.   [name] Hyperlipidemia    per pt NO  . Lumbar back pain   . Migraines   . MVP (mitral valve prolapse)   . 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 Post-operative nausea and vomiting     Past Surgical History:  Procedure Laterality Date  . CESAREAN SECTION     twice  . COLONOSCOPY    . CRYOABLATION     times 2 for abn paps  . ESOPHAGOGASTRODUODENOSCOPY    . HYSTEROSCOPY    . WRIST SURGERY     right    Current Outpatient Medications  Medication Sig Dispense Refill  . atenolol (TENORMIN) 50 MG tablet TAKE 1 TABLET BY MOUTH DAILY. OFFICE VISIT NEEDED 90 tablet 0  . levocetirizine (XYZAL) 5 MG tablet TAKE 1 TABLET BY MOUTH EVERY DAY IN THE EVENING 90 tablet 3  . pantoprazole (PROTONIX) 40 MG tablet Take 1 tablet (40 mg total) by mouth daily. (Patient taking differently: Take 40 mg by mouth as needed. ) 90 tablet 1  . simvastatin (ZOCOR) 40 MG tablet TAKE 1 TABLET (40 MG TOTAL) BY MOUTH DAILY AT 6 PM. PLEASE KEEP OV 90 tablet 1   No current facility-administered medications for this visit.    Family History  Problem Relation Age of Onset  . Breast cancer Mother   . Heart disease Mother   . Diabetes Mother   . Hypertension Mother   . Prostate cancer Father   . Diabetes Sister   .  Hypertension Sister   . Heart disease Brother   . Other Brother        CHF, had heart transplant    ROS:  Pertinent items are noted in HPI.  Otherwise, a comprehensive ROS was negative.  Exam:   BP 110/70   Pulse 68   Temp 97.8 F (36.6 C) (Skin)   Ht 5' 2.75" (1.594 m)   Wt 160 lb (72.6 kg)   LMP 03/28/2010   BMI 28.57 kg/m  Height: 5' 2.75" (159.4 cm) Ht Readings from Last 3 Encounters:  04/15/19 5' 2.75" (1.594 m)  02/03/19 5\' 3"  (1.6 m)  12/07/18 5\' 3"  (1.6 m)    General appearance: alert, cooperative and appears stated age Head: Normocephalic, without obvious abnormality, atraumatic Neck: no adenopathy, supple, symmetrical, trachea midline and thyroid  normal to inspection and palpation Lungs: clear to auscultation bilaterally Breasts: normal appearance, no masses or tenderness, No nipple retraction or dimpling, No nipple discharge or bleeding, No axillary or supraclavicular adenopathy Heart: regular rate and rhythm Abdomen: soft, non-tender; no masses,  no organomegaly Extremities: extremities normal, atraumatic, no cyanosis or edema Skin: Skin color, texture, turgor normal. No rashes or lesions Lymph nodes: Cervical, supraclavicular, and axillary nodes normal. No abnormal inguinal nodes palpated Neurologic: Grossly normal   Pelvic: External genitalia:  no lesions,normal female              Urethra:  normal appearing urethra with no masses, tenderness or lesions              Bartholin's and Skene's: normal                 Vagina: normal appearing vagina with normal color and discharge, no lesions              Cervix: multiparous appearance, no cervical motion tenderness and no lesions              Pap taken: No. Bimanual Exam:  Uterus:  normal size, contour, position, consistency, mobility, non-tender and anteverted              Adnexa: normal adnexa and no mass, fullness, tenderness               Rectovaginal: Confirms               Anus:  normal sphincter tone, no lesions  Chaperone present: yes  A:  Well Woman with normal exam  Menopausal no HRT  Vaginal dryness noted on exam  History of Cryo with pap in past  Hypertension/cholesterol/GERD with PCP management, all stable.  BMD due next year   P:   Reviewed health and wellness pertinent to exam  Aware of need to advise if vaginal bleeding  Discussed dryness noted and can use OTC coconut or Olive oil if needed if symptomatic.  Pap due in 2021  Continue follow up with PCP as indicated  Patient to call prior to scheduling mammogram to have order placed for BMD.  Pap smear: no   counseled on breast self exam, mammography screening, feminine hygiene, adequate intake of  calcium and vitamin D, diet and exercise  return annually or prn  An After Visit Summary was printed and given to the patient.

## 2019-04-15 NOTE — Patient Instructions (Signed)

## 2019-04-18 ENCOUNTER — Ambulatory Visit: Payer: BC Managed Care – PPO | Admitting: Certified Nurse Midwife

## 2019-04-18 ENCOUNTER — Other Ambulatory Visit: Payer: Self-pay | Admitting: Cardiovascular Disease

## 2019-05-17 ENCOUNTER — Ambulatory Visit: Payer: BC Managed Care – PPO | Attending: Internal Medicine

## 2019-05-17 DIAGNOSIS — Z20822 Contact with and (suspected) exposure to covid-19: Secondary | ICD-10-CM

## 2019-05-18 LAB — NOVEL CORONAVIRUS, NAA: SARS-CoV-2, NAA: NOT DETECTED

## 2019-07-18 ENCOUNTER — Encounter: Payer: Self-pay | Admitting: Certified Nurse Midwife

## 2019-08-07 ENCOUNTER — Other Ambulatory Visit: Payer: Self-pay | Admitting: Cardiovascular Disease

## 2019-08-19 ENCOUNTER — Other Ambulatory Visit: Payer: Self-pay | Admitting: Cardiovascular Disease

## 2019-08-19 NOTE — Telephone Encounter (Signed)
Rx(s) sent to pharmacy electronically.  

## 2019-08-26 ENCOUNTER — Other Ambulatory Visit: Payer: Self-pay | Admitting: Cardiovascular Disease

## 2019-09-16 ENCOUNTER — Telehealth: Payer: Self-pay | Admitting: Internal Medicine

## 2019-09-16 NOTE — Telephone Encounter (Signed)
Dr. Sharlet Salina please advise this

## 2019-09-16 NOTE — Telephone Encounter (Signed)
    Patient requesting  Lab order to have potassium checked , cramping in feet.

## 2019-09-19 NOTE — Telephone Encounter (Signed)
Called patient she said she will call back to schedule appointment

## 2019-09-19 NOTE — Telephone Encounter (Signed)
Would be happy to have visit with her to address problem and check labs if appropriate.

## 2019-09-20 ENCOUNTER — Other Ambulatory Visit: Payer: Self-pay

## 2019-09-20 ENCOUNTER — Ambulatory Visit (INDEPENDENT_AMBULATORY_CARE_PROVIDER_SITE_OTHER): Payer: BC Managed Care – PPO | Admitting: Internal Medicine

## 2019-09-20 ENCOUNTER — Encounter: Payer: Self-pay | Admitting: Internal Medicine

## 2019-09-20 VITALS — BP 144/72 | HR 49 | Temp 98.2°F | Ht 62.75 in | Wt 161.0 lb

## 2019-09-20 DIAGNOSIS — R200 Anesthesia of skin: Secondary | ICD-10-CM | POA: Diagnosis not present

## 2019-09-20 DIAGNOSIS — R252 Cramp and spasm: Secondary | ICD-10-CM | POA: Insufficient documentation

## 2019-09-20 DIAGNOSIS — F418 Other specified anxiety disorders: Secondary | ICD-10-CM | POA: Diagnosis not present

## 2019-09-20 DIAGNOSIS — G609 Hereditary and idiopathic neuropathy, unspecified: Secondary | ICD-10-CM | POA: Insufficient documentation

## 2019-09-20 LAB — COMPREHENSIVE METABOLIC PANEL
ALT: 32 U/L (ref 0–35)
AST: 28 U/L (ref 0–37)
Albumin: 4.3 g/dL (ref 3.5–5.2)
Alkaline Phosphatase: 85 U/L (ref 39–117)
BUN: 8 mg/dL (ref 6–23)
CO2: 27 mEq/L (ref 19–32)
Calcium: 9.6 mg/dL (ref 8.4–10.5)
Chloride: 107 mEq/L (ref 96–112)
Creatinine, Ser: 0.68 mg/dL (ref 0.40–1.20)
GFR: 106.67 mL/min (ref >60.00)
Glucose, Bld: 107 mg/dL — ABNORMAL HIGH (ref 70–99)
Potassium: 3.8 mEq/L (ref 3.5–5.1)
Sodium: 141 mEq/L (ref 135–145)
Total Bilirubin: 0.8 mg/dL (ref 0.2–1.2)
Total Protein: 7 g/dL (ref 6.0–8.3)

## 2019-09-20 LAB — TSH: TSH: 2.65 u[IU]/mL (ref 0.35–4.50)

## 2019-09-20 LAB — MAGNESIUM: Magnesium: 1.8 mg/dL (ref 1.5–2.5)

## 2019-09-20 LAB — VITAMIN B12: Vitamin B-12: 529 pg/mL (ref 211–911)

## 2019-09-20 LAB — HEMOGLOBIN A1C: Hgb A1c MFr Bld: 5.5 % (ref 4.6–6.5)

## 2019-09-20 LAB — VITAMIN D 25 HYDROXY (VIT D DEFICIENCY, FRACTURES): VITD: 38.03 ng/mL (ref 30.00–100.00)

## 2019-09-20 MED ORDER — ALPRAZOLAM 0.5 MG PO TABS
0.5000 mg | ORAL_TABLET | Freq: Two times a day (BID) | ORAL | 0 refills | Status: DC | PRN
Start: 2019-09-20 — End: 2021-01-17

## 2019-09-20 NOTE — Patient Instructions (Signed)
We will check the labs today.   We have sent in the xanax to use for this time period.

## 2019-09-20 NOTE — Assessment & Plan Note (Signed)
Refill xanax at this time of stress for her. Reminded about risk of dependence and she wishes to continue.

## 2019-09-20 NOTE — Assessment & Plan Note (Signed)
With prior borderline low B12 and without response to oral B12. Is not taking that currently. Rechecking TSH, vitamin D and B12, HgA1c for alternate cause. Adjust as needed. If B12 still borderline low will start monthly B12 shots to see if this helps.

## 2019-09-20 NOTE — Assessment & Plan Note (Signed)
Checking CMP and magnesium and treat as appropriate. Encouraged extra hydration to see if that helps.

## 2019-09-20 NOTE — Progress Notes (Signed)
   Subjective:   Patient ID: Grace George, female    DOB: October 15, 1960, 59 y.o.   MRN: [id number]  HPI The patient is a 59 YO female coming in for concerns about cramping in her feet (mostly in the toes, denies change in diet or exercise, no new shoes, going on for weeks to months, denies changes in medications) and numbness in feet (going on for months, overall not improving, previously evaluated with labs and there were signs of low B12, she took this for a period of time and this did not seem to impact symptoms) and anxiety (father passed 2 months ago, she is not coping well, previously on rare xanax and wants to try this again short term, there was no will and she is now responsible for his estate and no other siblings are helping, she is concerned about how things will go when trying to divide with anticipated tension and hurt feelings, not sleeping well, denies SI/HI).    Review of Systems  Constitutional: Negative.   HENT: Negative.   Eyes: Negative.   Respiratory: Negative for cough, chest tightness and shortness of breath.   Cardiovascular: Negative for chest pain, palpitations and leg swelling.  Gastrointestinal: Negative for abdominal distention, abdominal pain, constipation, diarrhea, nausea and vomiting.  Musculoskeletal: Positive for myalgias.  Skin: Negative.   Neurological: Positive for numbness.  Psychiatric/Behavioral: Positive for decreased concentration, dysphoric mood and sleep disturbance. The patient is nervous/anxious.     Objective:  Physical Exam Constitutional:      Appearance: She is well-developed.  HENT:     Head: Normocephalic and atraumatic.  Cardiovascular:     Rate and Rhythm: Normal rate and regular rhythm.  Pulmonary:     Effort: Pulmonary effort is normal. No respiratory distress.     Breath sounds: Normal breath sounds. No wheezing or rales.  Abdominal:     General: Bowel sounds are normal. There is no distension.     Palpations: Abdomen is soft.       Tenderness: There is no abdominal tenderness. There is no rebound.  Musculoskeletal:     Cervical back: Normal range of motion.  Skin:    General: Skin is warm and dry.  Neurological:     Mental Status: She is alert and oriented to person, place, and time.     Coordination: Coordination normal.     Comments: Tingling in left foot more than right     Vitals:   09/20/19 0928  BP: (!) 144/72  Pulse: (!) 49  Temp: 98.2 F (36.8 C)  SpO2: 98%  Weight: 161 lb (73 kg)  Height: 5' 2.75" (1.594 m)    This visit occurred during the SARS-CoV-2 public health emergency.  Safety protocols were in place, including screening questions prior to the visit, additional usage of staff PPE, and extensive cleaning of exam room while observing appropriate contact time as indicated for disinfecting solutions.   Assessment & Plan:

## 2019-09-27 ENCOUNTER — Telehealth: Payer: Self-pay

## 2019-09-27 NOTE — Telephone Encounter (Signed)
Called patient back she made an appointment with a foot provider will call back if she decided to see neurologist.

## 2019-09-27 NOTE — Telephone Encounter (Signed)
We can send her to a neurologist if she wants to pursue a nerve conduction test to help with what is causing her symptoms.

## 2019-09-27 NOTE — Telephone Encounter (Signed)
This patient has already seen her lab results on MY Chart. This patient wants to know should she see a foot doctor or a neurologist for her foot issue?  Please advise

## 2019-09-27 NOTE — Telephone Encounter (Signed)
New message    The patient calling for test results.   

## 2019-11-21 ENCOUNTER — Telehealth: Payer: Self-pay | Admitting: Obstetrics and Gynecology

## 2019-11-21 DIAGNOSIS — Z9189 Other specified personal risk factors, not elsewhere classified: Secondary | ICD-10-CM

## 2019-11-21 DIAGNOSIS — Z803 Family history of malignant neoplasm of breast: Secondary | ICD-10-CM

## 2019-11-21 NOTE — Telephone Encounter (Signed)
Patient received a letter after mammogram that she is high risk for breast cancer.

## 2019-11-22 NOTE — Telephone Encounter (Signed)
Copy of MMG report to Dr. Quincy Simmonds to review and advise.

## 2019-11-22 NOTE — Telephone Encounter (Signed)
Call placed to [facility], requested copy of screening MMG report dated 11/03/19.   Spoke with patient, advised waiting on MMG report, once received and reviewed by provider will return call with recommendations. Patient agreeable.

## 2019-11-23 ENCOUNTER — Encounter: Payer: Self-pay | Admitting: Certified Nurse Midwife

## 2019-11-23 NOTE — Telephone Encounter (Signed)
Her mammogram from [facility] 11/03/19 was normal, BI-RADS1, density rating C.   They preformed the [employer] model and calculated a lifetime risk of breast cancer at 21.9%.  This qualifies her to do a yearly MRI of the breast for additional breast cancer screening.   If she would like to proceed with this testing, we can order this for [facility] and precert this procedure.   I recommend she have yearly annual exams with breast exams.

## 2019-11-23 NOTE — Telephone Encounter (Signed)
Spoke with patient, advised as seen below per Dr. Quincy Simmonds.  Patient request to porceed with breast MRI.  Order placed for MRI breast bilateral w/wo contrast at [facility].  Advised patient she will be contacted directly by [facility] to schedule. Once scheduled our office will precert. Patient verbalizes understanding and is agreeable.   Placed in [place] hold  Routing to provider for final review. Patient is agreeable to disposition. Will close encounter.  Cc: Hayley Carder

## 2019-12-22 ENCOUNTER — Other Ambulatory Visit: Payer: Self-pay

## 2019-12-22 ENCOUNTER — Ambulatory Visit
Admission: RE | Admit: 2019-12-22 | Discharge: 2019-12-22 | Disposition: A | Discharge status: Home / Self Care | Payer: BC Managed Care – PPO | Source: Ambulatory Visit | Attending: Obstetrics and Gynecology | Admitting: Obstetrics and Gynecology

## 2019-12-22 DIAGNOSIS — Z9189 Other specified personal risk factors, not elsewhere classified: Secondary | ICD-10-CM

## 2019-12-22 DIAGNOSIS — Z803 Family history of malignant neoplasm of breast: Secondary | ICD-10-CM

## 2019-12-22 MED ORDER — GADOBUTROL 1 MMOL/ML IV SOLN
7.0000 mL | Freq: Once | INTRAVENOUS | Status: AC | PRN
  Administered 2019-12-22: 7 mL via INTRAVENOUS

## 2020-01-14 ENCOUNTER — Other Ambulatory Visit: Payer: Self-pay | Admitting: Cardiovascular Disease

## 2020-01-18 ENCOUNTER — Other Ambulatory Visit: Payer: Self-pay | Admitting: Internal Medicine

## 2020-01-18 DIAGNOSIS — J309 Allergic rhinitis, unspecified: Secondary | ICD-10-CM

## 2020-02-02 ENCOUNTER — Other Ambulatory Visit: Payer: Self-pay

## 2020-02-02 ENCOUNTER — Ambulatory Visit (INDEPENDENT_AMBULATORY_CARE_PROVIDER_SITE_OTHER): Payer: BC Managed Care – PPO | Admitting: *Deleted

## 2020-02-02 DIAGNOSIS — Z23 Encounter for immunization: Secondary | ICD-10-CM | POA: Diagnosis not present

## 2020-02-12 ENCOUNTER — Other Ambulatory Visit: Payer: Self-pay | Admitting: Cardiovascular Disease

## 2020-02-13 ENCOUNTER — Ambulatory Visit: Payer: BC Managed Care – PPO | Admitting: Cardiovascular Disease

## 2020-03-01 ENCOUNTER — Ambulatory Visit: Payer: BC Managed Care – PPO | Admitting: Cardiovascular Disease

## 2020-03-14 ENCOUNTER — Ambulatory Visit: Payer: BC Managed Care – PPO | Admitting: Cardiovascular Disease

## 2020-04-05 ENCOUNTER — Ambulatory Visit (INDEPENDENT_AMBULATORY_CARE_PROVIDER_SITE_OTHER): Payer: BC Managed Care – PPO | Admitting: Internal Medicine

## 2020-04-05 ENCOUNTER — Other Ambulatory Visit: Payer: Self-pay

## 2020-04-05 ENCOUNTER — Encounter: Payer: Self-pay | Admitting: Internal Medicine

## 2020-04-05 VITALS — BP 160/84 | HR 66 | Temp 98.3°F | Ht 62.75 in | Wt 161.0 lb

## 2020-04-05 DIAGNOSIS — I10 Essential (primary) hypertension: Secondary | ICD-10-CM

## 2020-04-05 MED ORDER — LOSARTAN POTASSIUM 50 MG PO TABS: 50.0000 mg | ORAL_TABLET | Freq: Every day | ORAL | 3 refills | Status: DC

## 2020-04-05 NOTE — Patient Instructions (Signed)
We have sent in the losartan to take 1 pill daily for the blood pressure.   Keep the visit with Dr. [name] to see how the blood pressure is doing.  Come back to see Grace George in 2-3 months to check in also.

## 2020-04-05 NOTE — Progress Notes (Signed)
   Subjective:   Patient ID: Grace George, female    DOB: 1960/06/10, 59 y.o.   MRN: [id number]  HPI The patient is a 59 YO female coming in for concerns about left arm pain and high blood pressure. Started having left arm pain about 2 weeks ago. She did check blood pressure and it was high. She does sometimes get left arm pain with high blood pressure. Has visit with cardiologist 04/12/20 and tried to get sooner apt but did not mention the new left arm pain. She did not seek care but thought it could be related to stress. She is under a lot of stress. Denies missing atenolol recently. Denies excessive caffeine intake or change in this recently. Denies change in diet. Weight is stable from prior. Denies chest pains. Is not currently having left arm pain but had some yesterday. This arm pain has been intermittent but consistent in the last 2 weeks or so.   PMH, [facility], social history reviewed and updated  Review of Systems  Constitutional: Negative.   HENT: Negative.   Eyes: Negative.   Respiratory: Negative for cough, chest tightness and shortness of breath.   Cardiovascular: Negative for chest pain, palpitations and leg swelling.  Gastrointestinal: Negative for abdominal distention, abdominal pain, constipation, diarrhea, nausea and vomiting.  Musculoskeletal: Negative.   Skin: Negative.   Neurological: Positive for headaches.  Psychiatric/Behavioral: Negative.     Objective:  Physical Exam Constitutional:      Appearance: She is well-developed and well-nourished.  HENT:     Head: Normocephalic and atraumatic.  Eyes:     Extraocular Movements: EOM normal.  Cardiovascular:     Rate and Rhythm: Normal rate and regular rhythm.  Pulmonary:     Effort: Pulmonary effort is normal. No respiratory distress.     Breath sounds: Normal breath sounds. No wheezing or rales.  Abdominal:     General: Bowel sounds are normal. There is no distension.     Palpations: Abdomen is soft.      Tenderness: There is no abdominal tenderness. There is no rebound.  Musculoskeletal:        General: No edema.     Cervical back: Normal range of motion.  Skin:    General: Skin is warm and dry.  Neurological:     Mental Status: She is alert and oriented to person, place, and time.     Coordination: Coordination normal.  Psychiatric:        Mood and Affect: Mood and affect normal.     Vitals:   04/05/20 0948  BP: (!) 160/84  Pulse: 66  Temp: 98.3 F (36.8 C)  TempSrc: Oral  SpO2: 98%  Weight: 161 lb (73 kg)  Height: 5' 2.75" (1.594 m)   EKG: Rate 57, axis normal, interval normal, sinus brady, no st or t wave changes, 1st degree AV block gone since 2020 EKG otherwise no significant changes  This visit occurred during the SARS-CoV-2 public health emergency.  Safety protocols were in place, including screening questions prior to the visit, additional usage of staff PPE, and extensive cleaning of exam room while observing appropriate contact time as indicated for disinfecting solutions.   Assessment & Plan:

## 2020-04-06 NOTE — Assessment & Plan Note (Signed)
EKG done and no changes. Keep atenolol and HR limits increase in dosing. Add losartan 50 mg daily due to left arm pain with elevated BP. Visit with cardiology next week and can have BMP at that time. Return in 1-2 months for dosing adjustment.

## 2020-04-12 ENCOUNTER — Other Ambulatory Visit: Payer: Self-pay

## 2020-04-12 ENCOUNTER — Encounter: Payer: Self-pay | Admitting: Cardiovascular Disease

## 2020-04-12 ENCOUNTER — Ambulatory Visit: Payer: BC Managed Care – PPO | Admitting: Cardiovascular Disease

## 2020-04-12 ENCOUNTER — Other Ambulatory Visit: Payer: Self-pay | Admitting: Internal Medicine

## 2020-04-12 VITALS — BP 152/74 | HR 60 | Ht 63.0 in | Wt 160.4 lb

## 2020-04-12 DIAGNOSIS — R002 Palpitations: Secondary | ICD-10-CM

## 2020-04-12 DIAGNOSIS — F419 Anxiety disorder, unspecified: Secondary | ICD-10-CM | POA: Diagnosis not present

## 2020-04-12 DIAGNOSIS — I10 Essential (primary) hypertension: Secondary | ICD-10-CM | POA: Diagnosis not present

## 2020-04-12 DIAGNOSIS — Z79899 Other long term (current) drug therapy: Secondary | ICD-10-CM

## 2020-04-12 DIAGNOSIS — J309 Allergic rhinitis, unspecified: Secondary | ICD-10-CM

## 2020-04-12 DIAGNOSIS — E785 Hyperlipidemia, unspecified: Secondary | ICD-10-CM | POA: Diagnosis not present

## 2020-04-12 DIAGNOSIS — K219 Gastro-esophageal reflux disease without esophagitis: Secondary | ICD-10-CM | POA: Diagnosis not present

## 2020-04-12 LAB — COMPREHENSIVE METABOLIC PANEL
ALT: 22 IU/L (ref 0–32)
AST: 24 IU/L (ref 0–40)
Albumin/Globulin Ratio: 2 (ref 1.2–2.2)
Albumin: 4.6 g/dL (ref 3.8–4.9)
Alkaline Phosphatase: 106 IU/L (ref 44–121)
BUN/Creatinine Ratio: 14 (ref 12–28)
BUN: 10 mg/dL (ref 8–27)
Bilirubin Total: 0.4 mg/dL (ref 0.0–1.2)
CO2: 25 mmol/L (ref 20–29)
Calcium: 9.6 mg/dL (ref 8.7–10.3)
Chloride: 104 mmol/L (ref 96–106)
Creatinine, Ser: 0.73 mg/dL (ref 0.57–1.00)
GFR calc Af Amer: 104 mL/min/{1.73_m2} (ref >59)
GFR calc non Af Amer: 90 mL/min/{1.73_m2} (ref >59)
Globulin, Total: 2.3 g/dL (ref 1.5–4.5)
Glucose: 104 mg/dL — ABNORMAL HIGH (ref 65–99)
Potassium: 4.7 mmol/L (ref 3.5–5.2)
Sodium: 140 mmol/L (ref 134–144)
Total Protein: 6.9 g/dL (ref 6.0–8.5)

## 2020-04-12 LAB — LIPID PANEL
Chol/HDL Ratio: 2 ratio (ref 0.0–4.4)
Cholesterol, Total: 142 mg/dL (ref 100–199)
HDL: 72 mg/dL (ref >39)
LDL Chol Calc (NIH): 60 mg/dL (ref 0–99)
Triglycerides: 45 mg/dL (ref 0–149)
VLDL Cholesterol Cal: 10 mg/dL (ref 5–40)

## 2020-04-12 LAB — CBC
Hematocrit: 40.6 % (ref 34.0–46.6)
Hemoglobin: 13.4 g/dL (ref 11.1–15.9)
MCH: 29 pg (ref 26.6–33.0)
MCHC: 33 g/dL (ref 31.5–35.7)
MCV: 88 fL (ref 79–97)
Platelets: 152 10*3/uL (ref 150–450)
RBC: 4.62 x10E6/uL (ref 3.77–5.28)
RDW: 12.1 % (ref 11.7–15.4)
WBC: 4.6 10*3/uL (ref 3.4–10.8)

## 2020-04-12 LAB — TSH: TSH: 3.77 u[IU]/mL (ref 0.450–4.500)

## 2020-04-12 NOTE — Progress Notes (Signed)
Patient ID: Grace George, female   DOB: 06-Aug-1960, 59 y.o.   MRN: [id number]     HPI: Grace George is a 59 y.o. female who presents to the office for a 14 month follow-up cardiology evaluation.  Grace George has a history of hypertension, hyperlipidemia, and palpitations and has been treated with beta blocker therapy.  An echo Doppler study in 2009, showed normal systolic and diastolic function and she did have borderline mitral valve prolapse.  She has been on atenolol 50 mg daily for her palpitations.  She also has been on simvastatin 40 mg for hyperlipidemia.  Laboratory on 08/02/2014 revealed a hemoglobin of 13.1, hematocrit 39.9.  A competent metabolic panel was entirely normal.  She had normal renal function and LFTs.  Lipid studies were excellent with a total cholesterol 133, triglycerides 60, HDL 60, and LDL 61.  TSH was normal.  In 2017 she went to the emergency room complaints of shortness of breath.  She denied any exertional symptoms.  Previously, she had been walking on a treadmill for at least 35-40 minutes without discomfort. It was felt that possibly her symptoms were due to allergies.  Since I last saw her, she underwent an echo Doppler study on 10/03/2015.  This revealed normal LV function, infection fraction of 55% without regional wall motion and her maladies.  There was grade 2 diastolic dysfunction.  There was flattening mitral valve closure without frank prolapse and trivial TR.  She had normal pulmonary pressures.  I saw her in May 2018, she has continued to do well from a cardiac standpoint.  Typically her blood pressures have ranged in the 239 range systolically.  She has been maintained on a atenolol 50 mg daily with control of palpitations.  She is on simvastatin 40 mg for hyperlipidemia.  She was recently started on Linzess for chronic constipation but does not seem to have significant benefit from this but was just initiated.  She was seen by her primary physician  earlier in the year with some situational depression.  Her husband was diagnosed with cancer.   I last saw her in October 2020.  At that time she was remaining stable from a cardiovascular standpoint.  She was having at times some episodes of anxiety which resulted in stress mediated palpitations.  She denied any chest tightness, PND, orthopnea.  She had undergone laboratory in August 2020.  At that time LDL cholesterol was 54 dose of simvastatin.Hemoglobin A1c was stable at 5.5.  Her vitamin D had increased to 40.28.  Thyroid function studies, chemistry and CBC studies were stable.    Over the past year, Grace George has remained stable.  However recently she began to notice some nonexertional left arm discomfort and also blood pressure elevation.  She saw her primary physician Dr. Pricilla Holm on April 05, 2020.  At that time her blood pressure was elevated at 160/84 and losartan 50 mg daily was added to her atenolol 50 mg daily regimen.  She has been on losartan now for 1 week and has noticed blood pressure improvement when she takes her blood pressure at home with typical blood pressures ranging from 532-023 systolically.  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.  She continues to be on simvastatin 40 mg for hyperlipidemia and pantoprazole for GERD.  Denies any chest tightness or pressure.  Her arm discomfort has resolved.  She presents for follow-up cardiologic evaluation.  Past Medical History:  Diagnosis Date  .  Abnormal Pap smear   . Allergic rhinitis   . Allergy   . Anemia   . Anxiety   . CAD (coronary artery disease)   . GERD (gastroesophageal reflux disease)   . History of shingles 1/15  . History of stress test 06/2011   Abnormal Myocadial perfusion scan demomstrating an attenuation defect in the anterior region of the myocardium, No ischemia or infaract/scar is seenin the remaining myocardium. Excercise capacity 12 METS.  [name] HTN  (hypertension)   . Hx of echocardiogram 2009   Showed normal systolic and diastolic function. she does have borderline mitral valve prolapse.  [name] Hyperlipidemia    per pt NO  . Lumbar back pain   . Migraines   . MVP (mitral valve prolapse)   . 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 Post-operative nausea and vomiting     Past Surgical History:  Procedure Laterality Date  . CESAREAN SECTION     twice  . COLONOSCOPY    . CRYOABLATION     times 2 for abn paps  . ESOPHAGOGASTRODUODENOSCOPY    . HYSTEROSCOPY    . WRIST SURGERY     right    Allergies  Allergen Reactions  . Eggs Or Egg-Derived Products     Causes a rash, if she eats a lot!  . Sulfa Antibiotics     Rash & itching    Current Outpatient Medications  Medication Sig Dispense Refill  . ALPRAZolam (XANAX) 0.5 MG tablet Take 1 tablet (0.5 mg total) by mouth 2 (two) times daily as needed for anxiety. 60 tablet 0  . atenolol (TENORMIN) 50 MG tablet TAKE 1 TABLET BY MOUTH EVERY DAY 90 tablet 1  . levocetirizine (XYZAL) 5 MG tablet Take 1 tablet (5 mg total) by mouth every evening. APPOINTMENT NEEDED FOR ADDITIONAL REFILLS 90 tablet 0  . losartan (COZAAR) 50 MG tablet Take 1 tablet (50 mg total) by mouth daily. 90 tablet 3  . pantoprazole (PROTONIX) 40 MG tablet Take 1 tablet (40 mg total) by mouth daily. (Patient taking differently: Take 40 mg by mouth as needed.) 90 tablet 1  . simvastatin (ZOCOR) 40 MG tablet TAKE 1 TABLET (40 MG TOTAL) BY MOUTH DAILY AT 6 PM. 90 tablet 1   No current facility-administered medications for this visit.    Social History   Socioeconomic History  . Marital status: Married    Spouse name: Not on file  . Number of children: 2  . Years of education: Not on file  . Highest education level: Not on file  Occupational History    Employer: [employer]  Tobacco Use  . Smoking status: Never Smoker  . Smokeless tobacco: Never Used  Substance and Sexual  Activity  . Alcohol use: No    Alcohol/week: 0.0 standard drinks  . Drug use: No  . Sexual activity: Not Currently    Partners: Male    Birth control/protection: Post-menopausal  Other Topics Concern  . Not on file  Social History Narrative   Married, 2 kids   Employed - [employer] - in [place]   Never tobacco, drugs   No EtOH   Social Determinants of Radio broadcast assistant Strain: Not on file  Food Insecurity: Not on file  Transportation Needs: Not on file  Physical Activity: Not on file  Stress: Not on file  Social Connections: Not on file  Intimate Partner Violence: Not on file   Updated  social history:  she is married and has 2 children, currently age 33 and 84.  She has 2 grandchildren who are twins.  There is no tobacco or alcohol use.  Of note, her brother underwent cardiac transplantation .   Family History  Problem Relation Age of Onset  . Breast cancer Mother   . Heart disease Mother   . Diabetes Mother   . Hypertension Mother   . Prostate cancer Father   . Diabetes Sister   . Hypertension Sister   . Heart disease Brother   . Other Brother        CHF, had heart transplant    ROS General: Negative; No fevers, chills, or night sweats;  HEENT: Negative; No changes in vision or hearing, sinus congestion, difficulty swallowing Pulmonary:  mild shortness of breath, felt to be due to allergies Cardiovascular: Stress mediated palpitations, no chest pain or exertional dyspnea GI: History of GERD. GU: Negative; No dysuria, hematuria, or difficulty voiding Immunologic: Seasonal allergies Musculoskeletal: Negative; no myalgias, joint pain, or weakness Hematologic/Oncology: Negative; no easy bruising, bleeding Endocrine: Negative; no heat/cold intolerance; no diabetes Neuro: Occasional foot numbness Skin: Negative; No rashes or skin lesions Psychiatric: Negative; No behavioral problems, depression Sleep: Negative; No snoring, daytime  sleepiness, hypersomnolence, bruxism, restless legs, hypnogognic hallucinations, no cataplexy Other comprehensive 14 point system review is negative.   PE BP (!) 152/74   Pulse 60   Ht 5' 3" (1.6 m)   Wt 160 lb 6.4 oz (72.8 kg)   LMP 03/28/2010   SpO2 100%   BMI 28.41 kg/m    Repeat blood pressure by me was 118/70 supine 124/70 standing  Wt Readings from Last 3 Encounters:  04/12/20 160 lb 6.4 oz (72.8 kg)  04/05/20 161 lb (73 kg)  09/20/19 161 lb (73 kg)   General: Alert, oriented, no distress.  Skin: normal turgor, no rashes, warm and dry HEENT: Normocephalic, atraumatic. Pupils equal round and reactive to light; sclera anicteric; extraocular muscles intact;  Nose without nasal septal hypertrophy Mouth/Parynx benign; Mallinpatti scale 3 Neck: No JVD, no carotid bruits; normal carotid upstroke Lungs: clear to ausculatation and percussion; no wheezing or rales Chest wall: without tenderness to palpitation Heart: PMI not displaced, RRR, s1 s2 normal, 1/6 systolic murmur, no diastolic murmur, no rubs, gallops, thrills, or heaves Abdomen: soft, nontender; no hepatosplenomehaly, BS+; abdominal aorta nontender and not dilated by palpation. Back: no CVA tenderness Pulses 2+ Musculoskeletal: full range of motion, normal strength, no joint deformities Extremities: no clubbing cyanosis or edema, Homan's sign negative  Neurologic: grossly nonfocal; Cranial nerves grossly wnl Psychologic: Normal mood and affect  ECG (independently read by me): Normal sinus rhythm at 60 bpm.  Mild first-degree AV block with a PR interval of 210 ms.  No ectopy.  October 2020 ECG (independently read by me): Sinus Bradycardia at 53, PAV First degree AV block; PR 222 ms  June 2019 ECG (independently read by me): Sinus bradycardia 52 bpm.  Nonspecific T change.  Normal intervals.  Borderline first-degree AV block with a PR interval of 204 ms.  May 2018 ECG (independently read by me): Sinus bradycardia 54  bpm..  Borderline First-degree AV block with a PR interval of 202 ms.  May 2017 ECG (independently read by me): Sinus bradycardia 55 bpm.  First degree AV block with a PR interval of 204 ms.  April 2016 ECG (independently read by me): Normal sinus rhythm.  Mild RV conduction delay with T-wave inversion V1 and V2, isolated  PAC.  Borderline first-degree AV block with a PR interval at 22 ms.  April 2015 ECG (independently read by me): Sinus rhythm at 72 beats per minute with mild RV conduction delay.  She is previously noted T-wave inversion in leads V1 through V3.  She also has occasional PACs with different morphology to the P wave in these premature complexes.  QTc interval 47 ms.  PR interval 188 ms.  LABS:  BMP Latest Ref Rng & Units 09/20/2019 12/07/2018 10/16/2017  Glucose 70 - 99 mg/dL 107(H) 97 82  BUN 6 - 23 mg/dL _0 Creatinine 0.40 - 1.20 mg/dL 0.68 0.78 0.72  BUN/Creat Ratio 9 - 23 - - 19  Sodium 135 - 145 mEq/L 141 141 143  Potassium 3.5 - 5.1 mEq/L 3.8 5.1 4.3  Chloride 96 - 112 mEq/L 107 106 106  CO2 19 - 32 mEq/L _1 Calcium 8.4 - 10.5 mg/dL 9.6 10.0 9.5     Hepatic Function Latest Ref Rng & Units 09/20/2019 12/07/2018 10/16/2017  Total Protein 6.0 - 8.3 g/dL 7.0 7.2 6.6  Albumin 3.5 - 5.2 g/dL 4.3 4.5 4.6  AST 0 - 37 U/L _2 ALT 0 - 35 U/L 32 21 26  Alk Phosphatase 39 - 117 U/L 85 83 90  Total Bilirubin 0.2 - 1.2 mg/dL 0.8 0.9 0.7     CBC Latest Ref Rng & Units 12/07/2018 10/16/2017 08/29/2016  WBC 4.0 - 10.5 K/uL 3.8(L) 3.8 3.6(L)  Hemoglobin 12.0 - 15.0 g/dL 13.4 13.3 13.4  Hematocrit 36.0 - 46.0 % 40.5 39.6 41.2  Platelets 150.0 - 400.0 K/uL 144.0(L) 162 181   Lab Results  Component Value Date   TSH 2.65 09/20/2019    BNP No results found for: PROBNP  Lipid Panel     Component Value Date/Time   CHOL 130 12/07/2018 0909   CHOL 131 10/16/2017 0804   TRIG 52.0 12/07/2018 0909   HDL 65.70 12/07/2018 0909   HDL 68 10/16/2017 0804   CHOLHDL 2  12/07/2018 0909   VLDL 10.4 12/07/2018 0909   LDLCALC 54 12/07/2018 0909   LDLCALC 54 10/16/2017 0804     RADIOLOGY: No results found.  IMPRESSION:  1. Essential hypertension   2. Hyperlipidemia, unspecified hyperlipidemia type   3. Gastroesophageal reflux disease without esophagitis   4. Anxiety   5. Palpitations   6. Medication management     ASSESSMENT AND PLAN: Ms. Jaley Yan is a very pleasant 59 year-old female who has a history of hypertension, hyperlipidemia, and palpitations.  Her palpitations have stabilized on a atenolol.  In the past, palpitations were often anxiety mediated.  She had been doing well but recently has noticed blood pressure elevation resulting in initiation of losartan 50 mg daily started 6 days ago by her primary physician Dr. Sharlet Salina.  Her blood pressure at home has improved.  Her blood pressure today on repeat by me was 118/70 supine and was 124/72 standing.  She denies shortness of breath.  She did have an occasional ectopic complex in the standing position.  She is not had any exertional chest pain symptomatology.  In the past, with increased palpitation she had been advised that she can take an extra 12.5 mg of atenolol as necessary.  She has not had lipid studies since August 2020.  She is fasting today.  With her recent addition of initiation of losartan I will check fasting laboratory with a chemistry profile, CBC, TSH as well  as lipid studies.  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.  She rarely takes Bentyl pressures as all which she has for GERD.  She will be seeing Dr. Sharlet Salina in follow-up in several months.  As long as she remains stable I will see her in 1 year for reevaluation.  Troy Sine, MD, [facility]  04/12/2020 8:44 AM

## 2020-04-12 NOTE — Patient Instructions (Signed)
Medication Instructions:  No changes *If you need a refill on your cardiac medications before your next appointment, please call your pharmacy*   Lab Work: CMET, CBC, TSH, Lipids - FASTING If you have labs (blood work) drawn today and your tests are completely normal, you will receive your results only by: [name] MyChart Message (if you have MyChart) OR . A paper copy in the mail If you have any lab test that is abnormal or we need to change your treatment, we will call you to review the results.   Testing/Procedures: None ordered   Follow-Up: At [facility], you and your health needs are our priority.  As part of our continuing mission to provide you with exceptional heart care, we have created designated Provider Care Teams.  These Care Teams include your primary Cardiologist (physician) and Advanced Practice Providers (APPs -  Physician Assistants and Nurse Practitioners) who all work together to provide you with the care you need, when you need it.  We recommend signing up for the patient portal called "MyChart".  Sign up information is provided on this After Visit Summary.  MyChart is used to connect with patients for Virtual Visits (Telemedicine).  Patients are able to view lab/test results, encounter notes, upcoming appointments, etc.  Non-urgent messages can be sent to your provider as well.   To learn more about what you can do with MyChart, go to [email or web address].    Your next appointment:   1 year(s)  The format for your next appointment:   In Person  Provider:   Shelva Majestic, MD   Other Instructions None

## 2020-04-16 ENCOUNTER — Ambulatory Visit: Payer: BC Managed Care – PPO | Admitting: Certified Nurse Midwife

## 2020-05-22 ENCOUNTER — Encounter (HOSPITAL_BASED_OUTPATIENT_CLINIC_OR_DEPARTMENT_OTHER): Payer: Self-pay | Admitting: Orthopaedic Surgery

## 2020-05-22 ENCOUNTER — Other Ambulatory Visit (HOSPITAL_COMMUNITY)
Admission: RE | Admit: 2020-05-22 | Discharge: 2020-05-22 | Disposition: A | Discharge status: Home / Self Care | Payer: BC Managed Care – PPO | Source: Ambulatory Visit | Attending: Orthopaedic Surgery | Admitting: Orthopaedic Surgery

## 2020-05-22 DIAGNOSIS — Z20822 Contact with and (suspected) exposure to covid-19: Secondary | ICD-10-CM | POA: Diagnosis not present

## 2020-05-22 DIAGNOSIS — Z01812 Encounter for preprocedural laboratory examination: Secondary | ICD-10-CM | POA: Insufficient documentation

## 2020-05-22 DIAGNOSIS — Z882 Allergy status to sulfonamides status: Secondary | ICD-10-CM | POA: Diagnosis not present

## 2020-05-22 DIAGNOSIS — Z79899 Other long term (current) drug therapy: Secondary | ICD-10-CM | POA: Diagnosis not present

## 2020-05-22 DIAGNOSIS — X58XXXA Exposure to other specified factors, initial encounter: Secondary | ICD-10-CM | POA: Diagnosis not present

## 2020-05-22 DIAGNOSIS — M25361 Other instability, right knee: Secondary | ICD-10-CM | POA: Diagnosis present

## 2020-05-22 DIAGNOSIS — S82001A Unspecified fracture of right patella, initial encounter for closed fracture: Secondary | ICD-10-CM | POA: Diagnosis not present

## 2020-05-22 LAB — SARS CORONAVIRUS 2 (TAT 6-24 HRS): SARS Coronavirus 2: NEGATIVE

## 2020-05-24 ENCOUNTER — Encounter (HOSPITAL_BASED_OUTPATIENT_CLINIC_OR_DEPARTMENT_OTHER)
Admission: RE | Disposition: A | Discharge status: Home / Self Care | Payer: Self-pay | Source: Ambulatory Visit | Attending: Orthopaedic Surgery

## 2020-05-24 ENCOUNTER — Ambulatory Visit (HOSPITAL_BASED_OUTPATIENT_CLINIC_OR_DEPARTMENT_OTHER): Payer: BC Managed Care – PPO | Admitting: Certified Registered"

## 2020-05-24 ENCOUNTER — Ambulatory Visit (HOSPITAL_BASED_OUTPATIENT_CLINIC_OR_DEPARTMENT_OTHER)
Admission: RE | Admit: 2020-05-24 | Discharge: 2020-05-24 | Disposition: A | Discharge status: Home / Self Care | Payer: BC Managed Care – PPO | Source: Ambulatory Visit | Attending: Orthopaedic Surgery | Admitting: Orthopaedic Surgery

## 2020-05-24 ENCOUNTER — Other Ambulatory Visit: Payer: Self-pay

## 2020-05-24 ENCOUNTER — Encounter (HOSPITAL_BASED_OUTPATIENT_CLINIC_OR_DEPARTMENT_OTHER): Payer: Self-pay | Admitting: Orthopaedic Surgery

## 2020-05-24 DIAGNOSIS — S82001A Unspecified fracture of right patella, initial encounter for closed fracture: Secondary | ICD-10-CM | POA: Insufficient documentation

## 2020-05-24 DIAGNOSIS — X58XXXA Exposure to other specified factors, initial encounter: Secondary | ICD-10-CM | POA: Insufficient documentation

## 2020-05-24 DIAGNOSIS — M25361 Other instability, right knee: Secondary | ICD-10-CM | POA: Insufficient documentation

## 2020-05-24 DIAGNOSIS — Z20822 Contact with and (suspected) exposure to covid-19: Secondary | ICD-10-CM | POA: Insufficient documentation

## 2020-05-24 DIAGNOSIS — Z882 Allergy status to sulfonamides status: Secondary | ICD-10-CM | POA: Insufficient documentation

## 2020-05-24 DIAGNOSIS — Z79899 Other long term (current) drug therapy: Secondary | ICD-10-CM | POA: Insufficient documentation

## 2020-05-24 HISTORY — PX: LIGAMENT REPAIR: SHX5444

## 2020-05-24 HISTORY — PX: PATELLAR TENDON REPAIR: SHX737

## 2020-05-24 HISTORY — PX: PATELLECTOMY: SHX1022

## 2020-05-24 SURGERY — PATELLECTOMY
Anesthesia: General | Site: Knee | Laterality: Right

## 2020-05-24 MED ORDER — OXYCODONE HCL 5 MG/5ML PO SOLN: 5.0000 mg | Freq: Once | ORAL | Status: AC | PRN

## 2020-05-24 MED ORDER — PHENYLEPHRINE 40 MCG/ML (10ML) SYRINGE FOR IV PUSH (FOR BLOOD PRESSURE SUPPORT)
PREFILLED_SYRINGE | INTRAVENOUS | Status: AC
  Filled 2020-05-24: qty 10

## 2020-05-24 MED ORDER — MIDAZOLAM HCL 2 MG/2ML IJ SOLN
INTRAMUSCULAR | Status: AC
  Filled 2020-05-24: qty 2

## 2020-05-24 MED ORDER — LACTATED RINGERS IV SOLN: INTRAVENOUS | Status: DC

## 2020-05-24 MED ORDER — FENTANYL CITRATE (PF) 100 MCG/2ML IJ SOLN
100.0000 ug | Freq: Once | INTRAMUSCULAR | Status: AC
  Administered 2020-05-24: 100 ug via INTRAVENOUS

## 2020-05-24 MED ORDER — DROPERIDOL 2.5 MG/ML IJ SOLN
INTRAMUSCULAR | Status: DC | PRN
  Administered 2020-05-24: .625 mg via INTRAVENOUS

## 2020-05-24 MED ORDER — HYDROMORPHONE HCL 1 MG/ML IJ SOLN
INTRAMUSCULAR | Status: AC
  Filled 2020-05-24: qty 0.5

## 2020-05-24 MED ORDER — PROMETHAZINE HCL 25 MG/ML IJ SOLN: 6.2500 mg | INTRAMUSCULAR | Status: DC | PRN

## 2020-05-24 MED ORDER — DEXAMETHASONE SODIUM PHOSPHATE 10 MG/ML IJ SOLN
INTRAMUSCULAR | Status: DC | PRN
  Administered 2020-05-24: 4 mg via INTRAVENOUS

## 2020-05-24 MED ORDER — OXYCODONE HCL 5 MG PO TABS
5.0000 mg | ORAL_TABLET | Freq: Once | ORAL | Status: AC | PRN
  Administered 2020-05-24: 5 mg via ORAL

## 2020-05-24 MED ORDER — MEPERIDINE HCL 25 MG/ML IJ SOLN: 6.2500 mg | INTRAMUSCULAR | Status: DC | PRN

## 2020-05-24 MED ORDER — FENTANYL CITRATE (PF) 100 MCG/2ML IJ SOLN
INTRAMUSCULAR | Status: AC
  Filled 2020-05-24: qty 2

## 2020-05-24 MED ORDER — LIDOCAINE 2% (20 MG/ML) 5 ML SYRINGE
INTRAMUSCULAR | Status: DC | PRN
  Administered 2020-05-24: 60 mg via INTRAVENOUS

## 2020-05-24 MED ORDER — CEFAZOLIN SODIUM-DEXTROSE 2-4 GM/100ML-% IV SOLN
2.0000 g | INTRAVENOUS | Status: AC
  Administered 2020-05-24: 2 g via INTRAVENOUS

## 2020-05-24 MED ORDER — ROPIVACAINE HCL 5 MG/ML IJ SOLN
INTRAMUSCULAR | Status: DC | PRN
  Administered 2020-05-24: 30 mL via PERINEURAL

## 2020-05-24 MED ORDER — OXYCODONE HCL 5 MG PO TABS
ORAL_TABLET | ORAL | Status: AC
  Filled 2020-05-24: qty 1

## 2020-05-24 MED ORDER — PROPOFOL 500 MG/50ML IV EMUL
INTRAVENOUS | Status: DC | PRN
  Administered 2020-05-24: 25 ug/kg/min via INTRAVENOUS

## 2020-05-24 MED ORDER — 0.9 % SODIUM CHLORIDE (POUR BTL) OPTIME
TOPICAL | Status: DC | PRN
  Administered 2020-05-24: 200 mL

## 2020-05-24 MED ORDER — FENTANYL CITRATE (PF) 100 MCG/2ML IJ SOLN
INTRAMUSCULAR | Status: DC | PRN
  Administered 2020-05-24: 25 ug via INTRAVENOUS

## 2020-05-24 MED ORDER — EPHEDRINE 5 MG/ML INJ
INTRAVENOUS | Status: AC
  Filled 2020-05-24: qty 20

## 2020-05-24 MED ORDER — MIDAZOLAM HCL 2 MG/2ML IJ SOLN
2.0000 mg | Freq: Once | INTRAMUSCULAR | Status: AC
  Administered 2020-05-24: 2 mg via INTRAVENOUS

## 2020-05-24 MED ORDER — VANCOMYCIN HCL 1 G IV SOLR
INTRAVENOUS | Status: DC | PRN
  Administered 2020-05-24: 1000 mg via TOPICAL

## 2020-05-24 MED ORDER — ONDANSETRON HCL 4 MG/2ML IJ SOLN
INTRAMUSCULAR | Status: DC | PRN
  Administered 2020-05-24: 4 mg via INTRAVENOUS

## 2020-05-24 MED ORDER — HYDROMORPHONE HCL 1 MG/ML IJ SOLN
0.2500 mg | INTRAMUSCULAR | Status: DC | PRN
  Administered 2020-05-24: 0.5 mg via INTRAVENOUS

## 2020-05-24 MED ORDER — CEFAZOLIN SODIUM-DEXTROSE 2-4 GM/100ML-% IV SOLN
INTRAVENOUS | Status: AC
  Filled 2020-05-24: qty 100

## 2020-05-24 MED ORDER — PROPOFOL 10 MG/ML IV BOLUS
INTRAVENOUS | Status: DC | PRN
  Administered 2020-05-24: 150 mg via INTRAVENOUS

## 2020-05-24 SURGICAL SUPPLY — 87 items
ANCH SUT CRKSW FT 1.3X (Anchor) ×2 IMPLANT
ANCHOR SUT BIOCOMP CORKSREW (Anchor) ×4 IMPLANT
APL PRP STRL LF DISP 70% ISPRP (MISCELLANEOUS) ×2
APL SKNCLS STERI-STRIP NONHPOA (GAUZE/BANDAGES/DRESSINGS) ×1
BANDAGE ESMARK 6X9 LF (GAUZE/BANDAGES/DRESSINGS) ×1 IMPLANT
BENZOIN TINCTURE PRP APPL 2/3 (GAUZE/BANDAGES/DRESSINGS) ×2 IMPLANT
BLADE SURG 10 STRL SS (BLADE) ×2 IMPLANT
BLADE SURG 15 STRL LF DISP TIS (BLADE) ×1 IMPLANT
BLADE SURG 15 STRL SS (BLADE) ×2
BNDG CMPR 9X6 STRL LF SNTH (GAUZE/BANDAGES/DRESSINGS) ×1
BNDG COHESIVE 4X5 TAN STRL (GAUZE/BANDAGES/DRESSINGS) ×2 IMPLANT
BNDG ELASTIC 4X5.8 VLCR STR LF (GAUZE/BANDAGES/DRESSINGS) ×2 IMPLANT
BNDG ELASTIC 6X5.8 VLCR STR LF (GAUZE/BANDAGES/DRESSINGS) ×2 IMPLANT
BNDG ESMARK 6X9 LF (GAUZE/BANDAGES/DRESSINGS) ×2
CHLORAPREP W/TINT 26 (MISCELLANEOUS) ×4 IMPLANT
CLSR STERI-STRIP ANTIMIC 1/2X4 (GAUZE/BANDAGES/DRESSINGS) ×2 IMPLANT
COOLER ICEMAN CLASSIC (MISCELLANEOUS) ×2 IMPLANT
COVER BACK TABLE 60X90IN (DRAPES) ×2 IMPLANT
COVER MAYO STAND STRL (DRAPES) ×2 IMPLANT
COVER WAND RF STERILE (DRAPES) IMPLANT
CUFF TOURN SGL QUICK 34 (TOURNIQUET CUFF) ×2
CUFF TRNQT CYL 34X4X40X1 (TOURNIQUET CUFF) ×1 IMPLANT
DECANTER SPIKE VIAL GLASS SM (MISCELLANEOUS) IMPLANT
DRAPE C-ARM 42X72 X-RAY (DRAPES) IMPLANT
DRAPE C-ARMOR (DRAPES) IMPLANT
DRAPE EXTREMITY T 121X128X90 (DISPOSABLE) ×2 IMPLANT
DRAPE IMP U-DRAPE 54X76 (DRAPES) ×2 IMPLANT
DRAPE INCISE IOBAN 66X45 STRL (DRAPES) ×2 IMPLANT
DRAPE U-SHAPE 47X51 STRL (DRAPES) ×2 IMPLANT
DRSG AQUACEL AG ADV 3.5X10 (GAUZE/BANDAGES/DRESSINGS) ×2 IMPLANT
ELECT REM PT RETURN 9FT ADLT (ELECTROSURGICAL) ×2
ELECTRODE REM PT RTRN 9FT ADLT (ELECTROSURGICAL) ×1 IMPLANT
GAUZE SPONGE 4X4 12PLY STRL (GAUZE/BANDAGES/DRESSINGS) ×2 IMPLANT
GAUZE XEROFORM 1X8 LF (GAUZE/BANDAGES/DRESSINGS) IMPLANT
GLOVE ECLIPSE 8.0 STRL XLNG CF (GLOVE) ×2 IMPLANT
GLOVE SRG 8 PF TXTR STRL LF DI (GLOVE) ×1 IMPLANT
GLOVE SURG ENC MOIS LTX SZ6.5 (GLOVE) ×2 IMPLANT
GLOVE SURG SS PI 7.0 STRL IVOR (GLOVE) ×2 IMPLANT
GLOVE SURG UNDER POLY LF SZ6.5 (GLOVE) ×2 IMPLANT
GLOVE SURG UNDER POLY LF SZ7 (GLOVE) ×4 IMPLANT
GLOVE SURG UNDER POLY LF SZ8 (GLOVE) ×2
GOWN STRL REUS W/ TWL LRG LVL3 (GOWN DISPOSABLE) ×1 IMPLANT
GOWN STRL REUS W/TWL LRG LVL3 (GOWN DISPOSABLE) ×2
GOWN STRL REUS W/TWL XL LVL3 (GOWN DISPOSABLE) ×2 IMPLANT
IMMOBILIZER KNEE 22 UNIV (SOFTGOODS) IMPLANT
IMMOBILIZER KNEE 24 THIGH 36 (MISCELLANEOUS) IMPLANT
IMMOBILIZER KNEE 24 UNIV (MISCELLANEOUS)
KIT ASCP FXDISP 3X8XBTNDS (KITS) IMPLANT
KIT BIO-TENODESIS 3X8 DISP (KITS)
KIT TRANSTIBIAL (DISPOSABLE) ×2 IMPLANT
NDL SUT 6 .5 CRC .975X.05 MAYO (NEEDLE) IMPLANT
NEEDLE MAYO TAPER (NEEDLE)
NEEDLE MAYO TROCAR (NEEDLE) ×2 IMPLANT
NS IRRIG 1000ML POUR BTL (IV SOLUTION) ×2 IMPLANT
PACK ARTHROSCOPY DSU (CUSTOM PROCEDURE TRAY) IMPLANT
PACK BASIN DAY SURGERY FS (CUSTOM PROCEDURE TRAY) ×2 IMPLANT
PAD CAST 4YDX4 CTTN HI CHSV (CAST SUPPLIES) ×1 IMPLANT
PAD COLD SHLDR WRAP-ON (PAD) ×2 IMPLANT
PADDING CAST ABS 6INX4YD NS (CAST SUPPLIES)
PADDING CAST ABS COTTON 6X4 NS (CAST SUPPLIES) IMPLANT
PADDING CAST COTTON 4X4 STRL (CAST SUPPLIES) ×2
PENCIL SMOKE EVACUATOR (MISCELLANEOUS) ×2 IMPLANT
RETRIEVER SUT HEWSON (MISCELLANEOUS) IMPLANT
SLEEVE SCD COMPRESS KNEE MED (MISCELLANEOUS) ×2 IMPLANT
SPONGE LAP 18X18 RF (DISPOSABLE) ×2 IMPLANT
STAPLER VISISTAT 35W (STAPLE) IMPLANT
STOCKINETTE IMPERVIOUS LG (DRAPES) ×2 IMPLANT
SUCTION FRAZIER HANDLE 10FR (MISCELLANEOUS) ×2
SUCTION TUBE FRAZIER 10FR DISP (MISCELLANEOUS) ×1 IMPLANT
SUT FIBERWIRE #2 38 REV NDL BL (SUTURE)
SUT FIBERWIRE #5 38 CONV NDL (SUTURE)
SUT MNCRL AB 4-0 PS2 18 (SUTURE) ×2 IMPLANT
SUT VIC AB 0 CT1 18XCR BRD 8 (SUTURE) ×1 IMPLANT
SUT VIC AB 0 CT1 27 (SUTURE) ×4
SUT VIC AB 0 CT1 27XBRD ANBCTR (SUTURE) ×2 IMPLANT
SUT VIC AB 0 CT1 8-18 (SUTURE) ×2
SUT VIC AB 2-0 CT1 27 (SUTURE)
SUT VIC AB 2-0 CT1 TAPERPNT 27 (SUTURE) IMPLANT
SUT VIC AB 3-0 SH 27 (SUTURE) ×2
SUT VIC AB 3-0 SH 27X BRD (SUTURE) ×1 IMPLANT
SUTURE FIBERWR #5 38 CONV NDL (SUTURE) IMPLANT
SUTURE FIBERWR#2 38 REV NDL BL (SUTURE) IMPLANT
SUTURE TAPE 1.3 FIBERLOP 20 ST (SUTURE) IMPLANT
SUTURETAPE 1.3 FIBERLOOP 20 ST (SUTURE)
SYR BULB EAR ULCER 3OZ GRN STR (SYRINGE) ×2 IMPLANT
TOWEL GREEN STERILE FF (TOWEL DISPOSABLE) ×2 IMPLANT
TUBE SUCTION HIGH CAP CLEAR NV (SUCTIONS) ×2 IMPLANT

## 2020-05-24 NOTE — Progress Notes (Signed)
AssistedDr. Miller with right, ultrasound guided, adductor canal block. Side rails up, monitors on throughout procedure. See vital signs in flow sheet. Tolerated Procedure well.  

## 2020-05-24 NOTE — Anesthesia Procedure Notes (Signed)
Anesthesia Regional Block: Adductor canal block   Pre-Anesthetic Checklist: ,, timeout performed, Correct Patient, Correct Site, Correct Laterality, Correct Procedure, Correct Position, site marked, Risks and benefits discussed,  Surgical consent,  Pre-op evaluation,  At surgeon's request and post-op pain management  Laterality: Right  Prep: chloraprep       Needles:  Injection technique: Single-shot  Needle Type: Stimiplex     Needle Length: 9cm  Needle Gauge: 21     Additional Needles:   Procedures:,,,, ultrasound used (permanent image in chart),,,,  Narrative:  Start time: 05/24/2020 9:00 AM End time: 05/24/2020 9:05 AM Injection made incrementally with aspirations every 5 mL.  Performed by: Personally  Anesthesiologist: Lynda Rainwater, MD

## 2020-05-24 NOTE — Anesthesia Preprocedure Evaluation (Signed)
Anesthesia Evaluation  Patient identified by MRN, date of birth, ID band Patient awake    Reviewed: Allergy & Precautions, H&P , NPO status , Patient's Chart, lab work & pertinent test results  History of Anesthesia Complications (+) PONV  Airway Mallampati: II  TM Distance: >3 FB Neck ROM: Full    Dental no notable dental hx.    Pulmonary neg pulmonary ROS,    Pulmonary exam normal breath sounds clear to auscultation       Cardiovascular hypertension, Pt. on medications negative cardio ROS Normal cardiovascular exam Rhythm:Regular Rate:Normal     Neuro/Psych  Headaches, Anxiety negative psych ROS   GI/Hepatic Neg liver ROS, GERD  ,  Endo/Other  negative endocrine ROS  Renal/GU negative Renal ROS  negative genitourinary   Musculoskeletal negative musculoskeletal ROS (+)   Abdominal   Peds negative pediatric ROS (+)  Hematology negative hematology ROS (+)   Anesthesia Other Findings   Reproductive/Obstetrics negative OB ROS                             Anesthesia Physical Anesthesia Plan  ASA: II  Anesthesia Plan: General   Post-op Pain Management:  Regional for Post-op pain   Induction: Intravenous  PONV Risk Score and Plan: 4 or greater and Ondansetron, Dexamethasone, Midazolam, Droperidol and Treatment may vary due to age or medical condition  Airway Management Planned: LMA  Additional Equipment:   Intra-op Plan:   Post-operative Plan: Extubation in OR  Informed Consent: I have reviewed the patients History and Physical, chart, labs and discussed the procedure including the risks, benefits and alternatives for the proposed anesthesia with the patient or authorized representative who has indicated his/her understanding and acceptance.     Dental advisory given  Plan Discussed with: CRNA  Anesthesia Plan Comments:         Anesthesia Quick Evaluation

## 2020-05-24 NOTE — Anesthesia Procedure Notes (Signed)
Procedure Name: LMA Insertion Date/Time: 05/24/2020 9:15 AM Performed by: Signe Colt, CRNA Pre-anesthesia Checklist: Patient identified, Emergency Drugs available, Suction available and Patient being monitored Patient Re-evaluated:Patient Re-evaluated prior to induction Oxygen Delivery Method: Circle System Utilized Preoxygenation: Pre-oxygenation with 100% oxygen Induction Type: IV induction Ventilation: Mask ventilation without difficulty LMA: LMA inserted LMA Size: 4.0 Number of attempts: 1 Airway Equipment and Method: bite block Placement Confirmation: positive ETCO2 Tube secured with: Tape Dental Injury: Teeth and Oropharynx as per pre-operative assessment

## 2020-05-24 NOTE — Interval H&P Note (Signed)
History and Physical Interval Note:  05/24/2020 8:44 AM  Grace George  has presented today for surgery, with the diagnosis of RIGHT KNEE INSTABILITY RIGHT PATELLA FRACTURE RIGHT TENDON RUPTURE.  The various methods of treatment have been discussed with the patient and family. After consideration of risks, benefits and other options for treatment, the patient has consented to  Procedure(s): PATELLECTOMY (Right) [place] (Right) COLLATERAL LIGAMENT REPAIR BOTH SIDES (Right) as a surgical intervention.  The patient's history has been reviewed, patient examined, no change in status, stable for surgery.  I have reviewed the patient's chart and labs.  Questions were answered to the patient's satisfaction.     Hiram Gash

## 2020-05-24 NOTE — Op Note (Signed)
Orthopaedic Surgery Operative Note (CSN: [id number])  Grace George  Apr 17, 1961 Date of Surgery: 05/24/2020   Diagnoses:  RIGHT KNEE INSTABILITY RIGHT PATELLA FRACTURE Right patella fracture inferior pole with extensor mechanism rupture  Procedure: Right partial patellectomy Right patellar tendon repair Right medial retinacular repair Right lateral retinacular repair   Operative Finding Successful completion of the planned procedure.  Patient's bone quality was moderate to normal and she had a inferior pole patellar fracture with comminution that was clearly not amenable to ORIF.  We felt that partial patellectomy and repair of the tendon was appropriate.  We had robust tendon quality and a good repair with 2 5.5 mm Bio-Corkscrew anchors and both medial lateral retinacular repairs and oversewing of the tendon to the periosteum of the patella.  Post-operative plan: The patient will be weightbearing to tolerance in her brace starting therapy next week.  The patient will be discharged home.  DVT prophylaxis Aspirin 81 mg twice daily for 6 weeks.   Pain control with PRN pain medication preferring oral medicines.  Follow up plan will be scheduled in approximately 7 days for incision check and XR.  Post-Op Diagnosis: Same Surgeons:Primary: Grace Gash, MD Assistants:Grace McBane PA-C Location: [facility] OR ROOM 5 Anesthesia: General with regional anesthesia Antibiotics: Ancef 2 g with local vancomycin powder 1 g at the surgical site Tourniquet time: 30 Estimated Blood Loss: Minimal Complications: None Specimens: None Implants: Implant Name Type Inv. Item Serial No. Manufacturer Lot No. LRB No. Used Action  ANCHOR SUT Grace George - [id number] Anchor ANCHOR Grace George [employer] [id number] Right 1 Implanted  [address] Grace George - [id number] Anchor ANCHOR [employer]  [place] [id number] Right 1 Implanted    Indications for Surgery:   Grace George is a 60  y.o. female with fall resulting in a patellar fracture inferior pole with displacement and loss of her extensor mechanism.  We discussed that partial patellectomy was likely the best option as she had very little bone inferiorly.  She was a candidate for surgery as she had no active extension of the knee.  Benefits and risks of operative and nonoperative management were discussed prior to surgery with patient/guardian(s) and informed consent form was completed.  Specific risks including infection, need for additional surgery, nonunion, failure of fixation, extensor lag, stiffness amongst others.   Procedure:   The patient was identified properly. Informed consent was obtained and the surgical site was marked. The patient was taken up to suite where general anesthesia was induced.  The patient was positioned supine with a bump.  The right knee was prepped and draped in the usual sterile fashion.  Timeout was performed before the beginning of the case.  Tourniquet was used for the above duration.  We began with a small medial parapatellar approach starting at the inferior pole of the patella and extending nearly to the tibial tubercle.  With the skin sharp achieving hemostasis we progressed.  We raised full-thickness skin flaps and identified the rent in the extensor mechanism.  There is clear disruption of the medial lateral retinaculum and obviously we were able to access the knee through the patella fracture.  The patella fracture left inferior pole that was only about a centimeter in length that was not amenable to repair.  We performed a partial patellectomy and advanced the patellar tendon.  We irrigated copiously clearing the joint of any hematoma and then performed medial and lateral separate retinacular repairs with figure-of-eight 0 Vicryl  sutures x8-10 in each side to robustly repair this.  At that point were able to clear hematoma from the inferior pole of the existing proximal part of the  patella and exposed bone.  We placed guidewires to allow for a bio tenodesis at 4.5 mm reamer and then a tap to be placed before placing 2 5.5 mm corkscrew anchors.  These were double loaded.  We passed each limb of the double loaded suture anchors and in a locking Krakw fashion obtain good purchase of the tendon.  We then were able to sequentially pull on the contralateral limb to approximate tendon to bone laying or knots on the superficial aspect of the tendon compressing tendon to bone.  We did this with all 4 of our sutures from both anchors.  We had a robust repair and was stable to 30 degrees of flexion easily.  We then reinforced our repair with more 0 Vicryl sutures.  We irrigated the wound copiously before placing local antibiotic as listed above.  We closed the incision in a multilayer fashion with absorbable suture.  Sterile dressing was placed.  Hinged knee brace locked in extension was placed.  Patient was awoken taken to PACU in stable condition.  Grace Chapel, PA-C, present and scrubbed throughout the case, critical for completion in a timely fashion, and for retraction, instrumentation, closure.

## 2020-05-24 NOTE — Discharge Instructions (Signed)
Post Anesthesia Home Care Instructions  Activity: Get plenty of rest for the remainder of the day. A responsible individual must stay with you for 24 hours following the procedure.  For the next 24 hours, DO NOT: -Drive a car -Paediatric nurse -Drink alcoholic beverages -Take any medication unless instructed by your physician -Make any legal decisions or sign important papers.  Meals: Start with liquid foods such as gelatin or soup. Progress to regular foods as tolerated. Avoid greasy, spicy, heavy foods. If nausea and/or vomiting occur, drink only clear liquids until the nausea and/or vomiting subsides. Call your physician if vomiting continues.  Special Instructions/Symptoms: Your throat may feel dry or sore from the anesthesia or the breathing tube placed in your throat during surgery. If this causes discomfort, gargle with warm salt water. The discomfort should disappear within 24 hours.  If you had a scopolamine patch placed behind your ear for the management of post- operative nausea and/or vomiting:  1. The medication in the patch is effective for 72 hours, after which it should be removed.  Wrap patch in a tissue and discard in the trash. Wash hands thoroughly with soap and water. 2. You may remove the patch earlier than 72 hours if you experience unpleasant side effects which may include dry mouth, dizziness or visual disturbances. 3. Avoid touching the patch. Wash your hands with soap and water after contact with the patch.    Regional Anesthesia Blocks  1. Numbness or the inability to move the "blocked" extremity may last from 3-48 hours after placement. The length of time depends on the medication injected and your individual response to the medication. If the numbness is not going away after 48 hours, call your surgeon.  2. The extremity that is blocked will need to be protected until the numbness is gone and the  Strength has returned. Because you cannot feel it, you will  need to take extra care to avoid injury. Because it may be weak, you may have difficulty moving it or using it. You may not know what position it is in without looking at it while the block is in effect.  3. For blocks in the legs and feet, returning to weight bearing and walking needs to be done carefully. You will need to wait until the numbness is entirely gone and the strength has returned. You should be able to move your leg and foot normally before you try and bear weight or walk. You will need someone to be with you when you first try to ensure you do not fall and possibly risk injury.  4. Bruising and tenderness at the needle site are common side effects and will resolve in a few days.  5. Persistent numbness or new problems with movement should be communicated to the surgeon or the [facility] [phone] [place] [phone]).Ophelia Charter MD, MPH Noemi Chapel, PA-C [facility] [address] [phone] (tel)   [phone] (fax)   POST-OPERATIVE INSTRUCTIONS - ACL RECONSTRUCTION  WOUND CARE  You may remove the Operative Dressing on Post-Op Day #3 (72hrs after surgery).    Leave steri strips in place.    If you feel more comfortable with it you can leave all dressings in place till your 1 week follow-up appointment.    KEEP THE INCISIONS CLEAN AND DRY.  An ACE wrap may be used to control swelling, do not wrap this too tight.  If the initial ACE wrap feels too  tight or constricting you may loosen it.  There may be a small amount of fluid/bleeding leaking at the surgical site.   This is normal; the knee is filled with fluid during the procedure and can leak for 24-48hrs after surgery.   You may change/reinforce the bandage as needed.   Use the Cryocuff, GameReady or Ice as often as possible for the first 3-4 days, then as needed for pain relief. Always keep a towel, ACE wrap or other barrier between the  cooling unit and your skin.   You may shower on Post-Op Day #3. Gently pat the area dry. Do not soak the knee in water.   Do not go swimming in the pool or ocean until 4 weeks after surgery or when otherwise instructed.  BRACE/AMBULATION  Your leg will be placed in a brace post-operatively.   You will need to wear your brace at all times until we discuss it further. (including when you sleep!)  You may remove your brace for shower/bathing only.   It should be locked in full extension (0 degrees) if adjustable.    You will be instructed on further bracing after your first visit.  Use crutches for comfort but you can put your full weight on the leg as tolerated.  REGIONAL ANESTHESIA (NERVE BLOCKS) - The anesthesia team may have performed a nerve block for you if safe in the setting of your care.  This is a great tool used to minimize pain.  Typically the block may start wearing off overnight.  This can be a challenging period but please utilize your as needed pain medications to try and manage this period and know it will be a brief transition as the nerve block wears completely   POST-OP MEDICATIONS- Multimodal approach to pain control  In general your pain will be controlled with a combination of substances.  Prescriptions unless otherwise discussed are electronically sent to your pharmacy.  This is a carefully made plan we use to minimize narcotic use.     ? Diclofenac - Anti-inflammatory medication taken on a scheduled basis - Take 1 tablet twice a day ? Acetaminophen - Non-narcotic pain medicine taken on a scheduled basis  - Take 2 (500mg ) tablets (1,000mg  total) every 8 hours ? Oxycodone - This is a strong narcotic, to be used only on an as needed basis for pain. - Take only as needed for severe pain, not controlled by the above medications ? Aspirin 81mg  - This medicine is used to minimize the risk of blood clots after surgery. - Take 1 tablet twice a day for 6  weeks ? Zofran - take as needed for nausea  These prescriptions were sent for you on Tuesday January 25 to [facility] on [address] [place]  Please call the office to schedule a follow-up appointment for your incision check if you do not already have one, 7-10 days post-operatively.  IF YOU HAVE ANY QUESTIONS, PLEASE FEEL FREE TO CALL OUR OFFICE.  HELPFUL INFORMATION   If you had a block, it will wear off between 8-24 hrs postop typically.  This is period when your pain may go from nearly zero to the pain you would have had post-op without the block.  This is an abrupt transition but nothing dangerous is happening.  You may take an extra dose of narcotic when this happens.   Keep your leg elevated to decrease swelling, which will then in turn decrease your pain. I would elevate the foot of your bed  by putting a couple of couch pillows between your mattress and box spring. I would not keep pillow directly under your ankle.   You must wear the brace locked while sleeping and ambulating until follow-up.    There will be MORE swelling on days 1-3 than there is on the day of surgery.  This also is normal. The swelling will decrease with the anti-inflammatory medication, ice and keeping it elevated. The swelling will make it more difficult to bend your knee. As the swelling goes down your motion will become easier   You may develop swelling and bruising that extends from your knee down to your calf and perhaps even to your foot over the next week. Do not be alarmed. This too is normal, and it is due to gravity   There may be some numbness adjacent to the incision site. This may last for 6-12 months or longer in some patients and is expected.   You may return to sedentary work/school in the next couple of days when you feel up to it. You will need to keep your leg elevated as much as possible    You should wean off your narcotic medicines as soon as you are able.  Most patients will  be off or using minimal narcotics before their first postop appointment.    We suggest you use the pain medication the first night prior to going to bed, in order to ease any pain when the anesthesia wears off. You should avoid taking pain medications on an empty stomach as it will make you nauseous.   Do not drink alcoholic beverages or take illicit drugs when taking pain medications.   It is against the law to drive while taking narcotics. You cannot drive if your Right leg is in brace locked in extension.   Pain medication may make you constipated.  Below are a few solutions to try in this order: o Decrease the amount of pain medication if you arent having pain. o Drink lots of decaffeinated fluids. o Drink prune juice and/or each dried prunes  o If the first 3 dont work start with additional solutions o Take Colace - an over-the-counter stool softener o Take Senokot - an over-the-counter laxative o Take Miralax - a stronger over-the-counter laxative  For more information including helpful videos and documents visit our website:   [email or web address]

## 2020-05-24 NOTE — Transfer of Care (Signed)
Immediate Anesthesia Transfer of Care Note  Patient: Grace George  Procedure(s) Performed: PATELLECTOMY (Right Knee) PATELLA TENDON REPAIR (Right Knee) COLLATERAL LIGAMENT REPAIR BOTH SIDES (Right Knee)  Patient Location: PACU  Anesthesia Type:GA combined with regional for post-op pain  Level of Consciousness: drowsy and patient cooperative  Airway & Oxygen Therapy: Patient Spontanous Breathing and Patient connected to face mask oxygen  Post-op Assessment: Report given to RN and Post -op Vital signs reviewed and stable  Post vital signs: Reviewed and stable  Last Vitals:  Vitals Value Taken Time  BP    Temp    Pulse 67 05/24/20 1018  Resp 13 05/24/20 1018  SpO2 100 % 05/24/20 1018  Vitals shown include unvalidated device data.  Last Pain:  Vitals:   05/24/20 0827  TempSrc: Oral  PainSc: 0-No pain         Complications: No complications documented.

## 2020-05-24 NOTE — H&P (Signed)
PREOPERATIVE H&P  Chief Complaint: RIGHT KNEE INSTABILITY RIGHT PATELLA FRACTURE RIGHT TENDON RUPTURE  HPI: Grace George is a 60 y.o. female who is scheduled for, Procedure(s): PATELLECTOMY PATELLA [place].   Patient is a 60 year old who fell on January 24 and was put into an immobilizer. She has an irregular heartbeat and has mitral valve proplase, well controlled. She is otherwise healthy and active.   Her symptoms are rated as moderate to severe, and have been worsening.  This is significantly impairing activities of daily living.    Please see clinic note for further details on this patient's care.    She has elected for surgical management.   Past Medical History:  Diagnosis Date  . Abnormal Pap smear   . Allergic rhinitis   . Allergy   . Anemia   . Anxiety   . GERD (gastroesophageal reflux disease)   . History of shingles 1/15  . History of stress test 06/2011   Abnormal Myocadial perfusion scan demomstrating an attenuation defect in the anterior region of the myocardium, No ischemia or infaract/scar is seenin the remaining myocardium. Excercise capacity 12 METS.  [name] HTN (hypertension)   . Hx of echocardiogram 2009   Showed normal systolic and diastolic function. she does have borderline mitral valve prolapse.  [name] Hyperlipidemia    per pt NO  . Lumbar back pain   . Migraines   . MVP (mitral valve prolapse)   . 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 Post-operative nausea and vomiting    Past Surgical History:  Procedure Laterality Date  . CESAREAN SECTION     twice  . COLONOSCOPY    . CRYOABLATION     times 2 for abn paps  . ESOPHAGOGASTRODUODENOSCOPY    . HYSTEROSCOPY    . WRIST SURGERY     right   Social History   Socioeconomic History  . Marital status: Married    Spouse name: Not on file  . Number of children: 2  . Years of education: Not on file  . Highest education  level: Not on file  Occupational History    Employer: [employer]  Tobacco Use  . Smoking status: Never Smoker  . Smokeless tobacco: Never Used  Substance and Sexual Activity  . Alcohol use: No    Alcohol/week: 0.0 standard drinks  . Drug use: No  . Sexual activity: Not Currently    Partners: Male    Birth control/protection: Post-menopausal  Other Topics Concern  . Not on file  Social History Narrative   Married, 2 kids   Employed - [employer] - [employer] - in [place]   Never tobacco, drugs   No EtOH   Social Determinants of Radio broadcast assistant Strain: Not on file  Food Insecurity: Not on file  Transportation Needs: Not on file  Physical Activity: Not on file  Stress: Not on file  Social Connections: Not on file   Family History  Problem Relation Age of Onset  . Breast cancer Mother   . Heart disease Mother   . Diabetes Mother   . Hypertension Mother   . Prostate cancer Father   . Diabetes Sister   . Hypertension Sister   . Heart disease Brother   . Other Brother        CHF, had heart transplant   Allergies  Allergen Reactions  . Eggs Or Egg-Derived Products  Causes a rash, if she eats a lot!  . Sulfa Antibiotics     Rash & itching   Prior to Admission medications   Medication Sig Start Date End Date Taking? Authorizing Provider  atenolol (TENORMIN) 50 MG tablet TAKE 1 TABLET BY MOUTH EVERY DAY 01/16/20  Yes Troy Sine, MD  calcium carbonate (OS-CAL) 1250 (500 Ca) MG chewable tablet Chew 1 tablet by mouth daily.   Yes [provider]  levocetirizine (XYZAL) 5 MG tablet Take 1 tablet (5 mg total) by mouth every evening. 04/12/20  Yes Hoyt Koch, MD  losartan (COZAAR) 50 MG tablet Take 1 tablet (50 mg total) by mouth daily. 04/05/20  Yes Hoyt Koch, MD  pantoprazole (PROTONIX) 40 MG tablet Take 1 tablet (40 mg total) by mouth daily. Patient taking differently: Take 40 mg by mouth as needed. 06/19/17  Yes  Hoyt Koch, MD  simvastatin (ZOCOR) 40 MG tablet TAKE 1 TABLET (40 MG TOTAL) BY MOUTH DAILY AT 6 PM. 02/13/20  Yes Troy Sine, MD  Thiamine HCl (VITAMIN B-1) 250 MG tablet Take 250 mg by mouth daily.   Yes [provider]  ALPRAZolam (XANAX) 0.5 MG tablet Take 1 tablet (0.5 mg total) by mouth 2 (two) times daily as needed for anxiety. 09/20/19   Hoyt Koch, MD    ROS: All other systems have been reviewed and were otherwise negative with the exception of those mentioned in the HPI and as above.  Physical Exam: General: Alert, no acute distress Cardiovascular: No pedal edema Respiratory: No cyanosis, no use of accessory musculature GI: No organomegaly, abdomen is soft and non-tender Skin: No lesions in the area of chief complaint Neurologic: Sensation intact distally Psychiatric: Patient is competent for consent with normal mood and affect Lymphatic: No axillary or cervical lymphadenopathy  MUSCULOSKELETAL:  Examination of the right knee shows diffuse tenderness over the patella.  Mild diffuse anterior swelling with some mild tenderness over the proximal patellar tendon.  No obvious deformity.  No active extension.  He is otherwise neurovascularly intact distally.    Imaging: X-rays demonstrate an anterior pole patella fracture with displacement.  Assessment: RIGHT KNEE INSTABILITY RIGHT PATELLA FRACTURE RIGHT TENDON RUPTURE  Plan: Plan for Procedure(s): PATELLECTOMY PATELLA TENDON REPAIR COLLATERAL LIGAMENT REPAIR BOTH SIDES  The risks benefits and alternatives were discussed with the patient including but not limited to the risks of nonoperative treatment, versus surgical intervention including infection, bleeding, nerve injury,  blood clots, cardiopulmonary complications, morbidity, mortality, among others, and they were willing to proceed.   The patient acknowledged the explanation, agreed to proceed with the plan and consent was signed.    Operative Plan: Right knee partial patellectomy, patellar tendon and retinaculum repairs  Discharge Medications: Already received them at office visit - Diclofenac  DVT Prophylaxis: Aspirin Physical Therapy: Outpatient Special Discharge needs: Ammie Ferrier, PA-C  05/24/2020 5:46 AM

## 2020-05-24 NOTE — Anesthesia Postprocedure Evaluation (Signed)
Anesthesia Post Note  Patient: Grace George  Procedure(s) Performed: PATELLECTOMY (Right Knee) PATELLA TENDON REPAIR (Right Knee) COLLATERAL LIGAMENT REPAIR BOTH SIDES (Right Knee)     Patient location during evaluation: PACU Anesthesia Type: General Level of consciousness: awake and alert Pain management: pain level controlled Vital Signs Assessment: post-procedure vital signs reviewed and stable Respiratory status: spontaneous breathing, nonlabored ventilation and respiratory function stable Cardiovascular status: blood pressure returned to baseline and stable Postop Assessment: no apparent nausea or vomiting Anesthetic complications: no   No complications documented.  Last Vitals:  Vitals:   05/24/20 1100 05/24/20 1115  BP: 136/73 134/78  Pulse: 65 77  Resp: 14 17  Temp:    SpO2: 100% 100%    Last Pain:  Vitals:   05/24/20 1115  TempSrc:   PainSc: [place]

## 2020-05-25 ENCOUNTER — Encounter (HOSPITAL_BASED_OUTPATIENT_CLINIC_OR_DEPARTMENT_OTHER): Payer: Self-pay | Admitting: Orthopaedic Surgery

## 2020-05-31 ENCOUNTER — Telehealth: Payer: Self-pay | Admitting: Internal Medicine

## 2020-05-31 NOTE — Telephone Encounter (Signed)
Patient had emergency surgery on her knee last week and she was supposed to be coming in on the 11th to follow up on the blood pressure medication to see if it is working. She is wondering what she needs to do because she is still having a hard time walking but knows she needs to follow up. She said if it needed to be soon she might be able to get some help coming here but wanted to ask what she should do.

## 2020-06-01 NOTE — Telephone Encounter (Signed)
Can wait a few weeks until she is getting around better.

## 2020-06-01 NOTE — Telephone Encounter (Signed)
See below

## 2020-06-01 NOTE — Telephone Encounter (Signed)
Spoke with the patient and her appointment has been rescheduled for 06/22/2020 at 10 am. Patient is aware of new appointment date and time. No other questions or concerns at this time.

## 2020-06-08 ENCOUNTER — Ambulatory Visit: Payer: BC Managed Care – PPO | Admitting: Internal Medicine

## 2020-06-15 NOTE — Telephone Encounter (Signed)
Ok to push appointment out.

## 2020-06-15 NOTE — Telephone Encounter (Signed)
Patient called back to see if she can push the follow up out. She is having trouble getting around since having knee surgery.   Please advise.

## 2020-06-22 ENCOUNTER — Ambulatory Visit: Payer: BC Managed Care – PPO | Admitting: Internal Medicine

## 2020-07-01 ENCOUNTER — Other Ambulatory Visit: Payer: Self-pay | Admitting: Internal Medicine

## 2020-07-01 ENCOUNTER — Other Ambulatory Visit: Payer: Self-pay | Admitting: Cardiovascular Disease

## 2020-07-01 DIAGNOSIS — J309 Allergic rhinitis, unspecified: Secondary | ICD-10-CM

## 2020-08-24 ENCOUNTER — Ambulatory Visit: Payer: BC Managed Care – PPO | Admitting: Internal Medicine

## 2020-08-28 ENCOUNTER — Encounter: Payer: Self-pay | Admitting: Internal Medicine

## 2020-08-28 ENCOUNTER — Ambulatory Visit: Payer: BC Managed Care – PPO | Admitting: Internal Medicine

## 2020-08-28 ENCOUNTER — Other Ambulatory Visit: Payer: Self-pay

## 2020-08-28 VITALS — BP 134/78 | HR 51 | Temp 98.2°F | Resp 18 | Ht 63.0 in | Wt 162.0 lb

## 2020-08-28 DIAGNOSIS — J309 Allergic rhinitis, unspecified: Secondary | ICD-10-CM | POA: Diagnosis not present

## 2020-08-28 DIAGNOSIS — K219 Gastro-esophageal reflux disease without esophagitis: Secondary | ICD-10-CM | POA: Diagnosis not present

## 2020-08-28 DIAGNOSIS — I10 Essential (primary) hypertension: Secondary | ICD-10-CM

## 2020-08-28 LAB — COMPREHENSIVE METABOLIC PANEL
ALT: 24 U/L (ref 0–35)
AST: 24 U/L (ref 0–37)
Albumin: 4.6 g/dL (ref 3.5–5.2)
Alkaline Phosphatase: 93 U/L (ref 39–117)
BUN: 12 mg/dL (ref 6–23)
CO2: 28 mEq/L (ref 19–32)
Calcium: 10 mg/dL (ref 8.4–10.5)
Chloride: 104 mEq/L (ref 96–112)
Creatinine, Ser: 0.73 mg/dL (ref 0.40–1.20)
GFR: 88.85 mL/min (ref >60.00)
Glucose, Bld: 97 mg/dL (ref 70–99)
Potassium: 4.5 mEq/L (ref 3.5–5.1)
Sodium: 139 mEq/L (ref 135–145)
Total Bilirubin: 0.5 mg/dL (ref 0.2–1.2)
Total Protein: 7.5 g/dL (ref 6.0–8.3)

## 2020-08-28 LAB — HEMOGLOBIN A1C: Hgb A1c MFr Bld: 5.5 % (ref 4.6–6.5)

## 2020-08-28 MED ORDER — PANTOPRAZOLE SODIUM 40 MG PO TBEC: 40.0000 mg | DELAYED_RELEASE_TABLET | ORAL | 3 refills | Status: DC | PRN

## 2020-08-28 MED ORDER — LEVOCETIRIZINE DIHYDROCHLORIDE 5 MG PO TABS: ORAL_TABLET | ORAL | 3 refills | Status: DC

## 2020-08-28 NOTE — Assessment & Plan Note (Signed)
Will reduce atenolol to 25 mg daily and keep losartan 50 mg daily. Checking CMP and adjust as needed. She will let us know how HR and BP are doing in 2-3 weeks.

## 2020-08-28 NOTE — Progress Notes (Signed)
   Subjective:   Patient ID: Grace George, female    DOB: 23-Feb-1961, 60 y.o.   MRN: [id number]  HPI The patient is a 60 YO female coming in for follow up blood pressure. In the last few weeks she is noticing lower HR into low 50s. She does take atenolol and no changes in dose lately. Did start losartan 50 mg daily back in Dec and this has lower BP well. She is noticing mostly 120s/60-70s at home. Denies headaches or chest pains. Denies SOB.   Review of Systems  Constitutional: Positive for fatigue.  HENT: Negative.   Eyes: Negative.   Respiratory: Negative for cough, chest tightness and shortness of breath.   Cardiovascular: Negative for chest pain, palpitations and leg swelling.  Gastrointestinal: Negative for abdominal distention, abdominal pain, constipation, diarrhea, nausea and vomiting.  Musculoskeletal: Negative.   Skin: Negative.   Neurological: Negative.   Psychiatric/Behavioral: Negative.     Objective:  Physical Exam Constitutional:      Appearance: She is well-developed.  HENT:     Head: Normocephalic and atraumatic.  Cardiovascular:     Rate and Rhythm: Normal rate and regular rhythm.  Pulmonary:     Effort: Pulmonary effort is normal. No respiratory distress.     Breath sounds: Normal breath sounds. No wheezing or rales.  Abdominal:     General: Bowel sounds are normal. There is no distension.     Palpations: Abdomen is soft.     Tenderness: There is no abdominal tenderness. There is no rebound.  Musculoskeletal:     Cervical back: Normal range of motion.  Skin:    General: Skin is warm and dry.  Neurological:     Mental Status: She is alert and oriented to person, place, and time.     Coordination: Coordination normal.     Vitals:   08/28/20 0836  BP: 134/78  Pulse: (!) 51  Resp: 18  Temp: 98.2 F (36.8 C)  TempSrc: Oral  SpO2: 96%  Weight: 162 lb (73.5 kg)  Height: 5\' 3"  (1.6 m)    This visit occurred during the SARS-CoV-2 public health  emergency.  Safety protocols were in place, including screening questions prior to the visit, additional usage of staff PPE, and extensive cleaning of exam room while observing appropriate contact time as indicated for disinfecting solutions.   Assessment & Plan:

## 2020-08-28 NOTE — Patient Instructions (Addendum)
We will have you reduce the atenolol to 25 mg  (1/2 pill) daily. Let us know in 2-3 weeks if this is helping with the heart rate.   We are checking the labs today including the sugar.

## 2020-08-29 ENCOUNTER — Ambulatory Visit: Payer: BC Managed Care – PPO | Admitting: Internal Medicine

## 2020-10-10 ENCOUNTER — Encounter: Payer: Self-pay | Admitting: Internal Medicine

## 2020-12-19 ENCOUNTER — Other Ambulatory Visit (HOSPITAL_COMMUNITY)
Admission: RE | Admit: 2020-12-19 | Discharge: 2020-12-19 | Disposition: A | Discharge status: Home / Self Care | Payer: BC Managed Care – PPO | Source: Ambulatory Visit | Attending: Nurse Practitioner | Admitting: Nurse Practitioner

## 2020-12-19 ENCOUNTER — Encounter: Payer: Self-pay | Admitting: Nurse Practitioner

## 2020-12-19 ENCOUNTER — Ambulatory Visit (INDEPENDENT_AMBULATORY_CARE_PROVIDER_SITE_OTHER): Payer: BC Managed Care – PPO | Admitting: Nurse Practitioner

## 2020-12-19 ENCOUNTER — Other Ambulatory Visit: Payer: Self-pay

## 2020-12-19 VITALS — BP 122/80 | Ht 63.0 in | Wt 161.0 lb

## 2020-12-19 DIAGNOSIS — Z01419 Encounter for gynecological examination (general) (routine) without abnormal findings: Secondary | ICD-10-CM | POA: Insufficient documentation

## 2020-12-19 DIAGNOSIS — E559 Vitamin D deficiency, unspecified: Secondary | ICD-10-CM | POA: Diagnosis not present

## 2020-12-19 DIAGNOSIS — Z8262 Family history of osteoporosis: Secondary | ICD-10-CM

## 2020-12-19 DIAGNOSIS — Z780 Asymptomatic menopausal state: Secondary | ICD-10-CM | POA: Diagnosis not present

## 2020-12-19 NOTE — Progress Notes (Signed)
Grace George [birth year] OR:[id number]   History:  60 y.o. G2P2002 presents for annual exam without GYN complaints. Postmenopausal - no HRT, no bleeding. Cryo years ago, subsequent paps normal. HTN, GERD, HLD managed by PCP. Also sees cardiology for palpitations. She reports being on high dose Vitamin D for about a year and would like this rechecked today.   Gynecologic History Patient's last menstrual period was 03/28/2010.   Contraception/Family planning: post menopausal status Sexually active: No  Health Maintenance Last Pap: 08/13/2016. Results were: Normal, 5-year repeat Last mammogram: 11/08/2020. Results were: Normal Last colonoscopy: 2020. Results were: 5-year recall Last Dexa: Never  Past medical history, past surgical history, family history and social history were all reviewed and documented in the EPIC chart. Married. 2 daughters, on in [place], one in [place] - has 61 yo boy/girl twins. Mother with osteoporosis.   ROS:  A ROS was performed and pertinent positives and negatives are included.  Exam:  Vitals:   12/19/20 0758  BP: 122/80  Weight: 161 lb (73 kg)  Height: '5\' 3"'$  (1.6 m)   Body mass index is 28.52 kg/m.  General appearance:  Normal Thyroid:  Symmetrical, normal in size, without palpable masses or nodularity. Respiratory  Auscultation:  Clear without wheezing or rhonchi Cardiovascular  Auscultation:  Regular rate, without rubs, murmurs or gallops  Edema/varicosities:  Not grossly evident Abdominal  Soft,nontender, without masses, guarding or rebound.  Liver/spleen:  No organomegaly noted  Hernia:  None appreciated  Skin  Inspection:  Grossly normal Breasts: Examined lying and sitting.   Right: Without masses, retractions, nipple discharge or axillary adenopathy.   Left: Without masses, retractions, nipple discharge or axillary adenopathy. Genitourinary   Inguinal/mons:  Normal without inguinal adenopathy  External genitalia:  Normal appearing  vulva with no masses, tenderness, or lesions  BUS/Urethra/Skene's glands:  Normal  Vagina:  Atrophic changes  Cervix:  Normal appearing without discharge or lesions  Uterus:  Normal in size, shape and contour.  Midline and mobile, nontender  Adnexa/parametria:     Rt: Normal in size, without masses or tenderness.   Lt: Normal in size, without masses or tenderness.  Anus and perineum: Normal  Digital rectal exam: Normal sphincter tone without palpated masses or tenderness  Patient informed chaperone available to be present for breast and pelvic exam. Patient has requested no chaperone to be present. Patient has been advised what will be completed during breast and pelvic exam.   Assessment/Plan:  60 y.o. G2P2002 for annual exam.   Well female exam with routine gynecological exam - Plan: Cytology - PAP( [place]). Education provided on SBEs, importance of preventative screenings, current guidelines, high calcium diet, regular exercise, and multivitamin daily. Labs with PCP.   Postmenopausal - Plan: DG Bone Density. No HRT, no bleeding. Does have occasional hot flashes.   Vitamin D deficiency - Plan: VITAMIN D 25 Hydroxy (Vit-D Deficiency, Fractures). Reports taking high dose Vitamin D for about a year and would like this rechecked.   Family history of osteoporosis in mother - Plan: DG Bone Density. Mother with osteoporosis. Will get baseline Dexa now.   Screening for cervical cancer - Cryo many years ago, subsequent paps normal. Discussed 5-year recommendation per guidelines. She would like pap today.   Screening for breast cancer - Normal mammogram history.  Continue annual screenings.  Normal breast exam today.  Screening for colon cancer - 2020 colonoscopy. Will repeat at GI's recommended interval.   Return in 1 year for annual.  Tamela Gammon DNP, 8:27 AM 12/19/2020

## 2020-12-20 LAB — CYTOLOGY - PAP: Diagnosis: NEGATIVE

## 2020-12-20 LAB — VITAMIN D 25 HYDROXY (VIT D DEFICIENCY, FRACTURES): Vit D, 25-Hydroxy: 76 ng/mL (ref 30–100)

## 2021-01-17 ENCOUNTER — Other Ambulatory Visit: Payer: Self-pay | Admitting: Internal Medicine

## 2021-01-21 ENCOUNTER — Ambulatory Visit: Payer: BC Managed Care – PPO | Admitting: Nurse Practitioner

## 2021-01-22 ENCOUNTER — Ambulatory Visit: Payer: BC Managed Care – PPO | Admitting: Nurse Practitioner

## 2021-01-27 ENCOUNTER — Other Ambulatory Visit: Payer: Self-pay | Admitting: Internal Medicine

## 2021-02-13 ENCOUNTER — Telehealth: Payer: Self-pay | Admitting: Cardiovascular Disease

## 2021-02-13 MED ORDER — SIMVASTATIN 40 MG PO TABS: 40.0000 mg | ORAL_TABLET | Freq: Every day | ORAL | 2 refills | Status: DC

## 2021-02-13 NOTE — Telephone Encounter (Signed)
*  STAT* If patient is at the pharmacy, call can be transferred to refill team.   1. Which medications need to be refilled? (please list name of each medication and dose if known) Simvastatin  2. Which pharmacy/location (including street and city if local pharmacy) is medication to be sent to? [facility] [place]   3. Do they need a 30 day or 90 day supply? Enough until  her appointment 05-09-21

## 2021-02-20 ENCOUNTER — Ambulatory Visit (INDEPENDENT_AMBULATORY_CARE_PROVIDER_SITE_OTHER): Payer: BC Managed Care – PPO

## 2021-02-20 ENCOUNTER — Other Ambulatory Visit: Payer: Self-pay

## 2021-02-20 DIAGNOSIS — Z23 Encounter for immunization: Secondary | ICD-10-CM

## 2021-02-26 ENCOUNTER — Telehealth: Payer: Self-pay | Admitting: Internal Medicine

## 2021-02-26 NOTE — Telephone Encounter (Signed)
Patient call stating she has had low blood pressure readings  Patient states her blood pressure reading was 80/60 on 02-24-21  Patient states blood pressure reading for today 02-26-2021 was 91/60  Patient states she has dizziness and loss of balance  Patient transferred to team health

## 2021-02-26 NOTE — Telephone Encounter (Signed)
Patient instructed by teamhealth to be seen by pcp within 24hrs  There were no availability with pcp or any other [place] locations within 24 hrs  Patient was instructed by Dr. Sharlet Salina to go to the ed or urgent care  Advised patient of Dr. Sharlet Salina instructions, patient understood

## 2021-02-26 NOTE — Telephone Encounter (Signed)
---  Caller states is having really low blood pressure readings. It has been 80/60 and 91/60 with dizziness and loss of balance that happened on sunday and monday. Current BP is 98/60 and she is not dizzy today and has not taken it today. She is on losartan 50mg .   Team health will be scanned into media tab

## 2021-02-27 ENCOUNTER — Telehealth: Payer: Self-pay | Admitting: Cardiovascular Disease

## 2021-02-27 NOTE — Telephone Encounter (Signed)
Pt c/o BP issue: STAT if pt c/o blurred vision, one-sided weakness or slurred speech  1. What are your last 5 BP readings?  10/30: 81/60 10/31: 96/60 11/01: 98/61  2. Are you having any other symptoms (ex. Dizziness, headache, blurred vision, passed out)?  Left arm pain (denies CP)  3. What is your BP issue?   Patient states her BP has been low. Contacted PCP, advised to go to urgent care and patient states she went to 2 different urgent care facilities and they were unable to see her. She would like to know if Dr. [name] has any recommendations. Will her medication need to be adjusted? Please advise.

## 2021-02-27 NOTE — Telephone Encounter (Signed)
Spoke with patient.  She currently takes losartan 50 mg daily, recently had to quit her job because of broke kneecap.  Now healed and living without the stress of her job.  Advised that she hold losartan today, then resume tomorrow at 25 mg daily.  She should continue to monitor home BP 2x daily, if < 95/50 should hold next dose of losartan.  Report back in 1-2 weeks if any concerns.

## 2021-02-27 NOTE — Telephone Encounter (Signed)
Returned call to patient of Dr. [name] She has had low BP since 10/30 She reported feeling lghtheaded when her BP was in the 80s - did not take losartan on Sunday d/t this She took losartan on Monday b/c she was worried about missing med -- SBP in the 90s She has not taken losartan since then Off losartan: SBP 105-109, 122/67 (<<readings today) Her PCP told her to go to UC for eval and she noticed her BP was up to 140s/70s yesterday (upset over this recommendation and that no UC would see her)  She denies weight loss, she reports dieting d/t dental work that impeded her eating. Does not feel dehydrated   She has been taking vit b12 daily x1 month   Presently she is OFF atenolol (per Erasmo Downer, PharmD in June) and losartan (per recent low BP)  Advised patient will send message to Pharmacy team and MD to review

## 2021-03-19 NOTE — Telephone Encounter (Signed)
Spoke with patient and she is still having fluctuating blood pressure readings.  She thinks the losartan causing her dizziness and then gets anxious that her blood pressure is too low or too high then will check blood pressure and it is elevated.  Has SBP readings ranging from 100's-140's  Was wanting to know if she could take Losartan alternating with none or 1/2  Will forward to [employer] D for review

## 2021-03-19 NOTE — Telephone Encounter (Signed)
Have her cut back to just 25 mg losartan every day.  See if that resolves the dizziness problem.  Only check BP at home twice daily - morning before breakfast and evening about an hour after dinner.

## 2021-03-19 NOTE — Addendum Note (Signed)
Addended by: Alvina Filbert B on: 03/19/2021 07:17 PM   Modules accepted: Orders

## 2021-03-19 NOTE — Telephone Encounter (Signed)
Pt c/o medication issue:  1. Name of Medication:  losartan (COZAAR) 50 MG tablet  2. How are you currently taking this medication (dosage and times per day)?  Patient takes 1/2 tablet daily, but she didn't take it today or yesterday   3. Are you having a reaction (difficulty breathing--STAT)?  See below  4. What is your medication issue?   Patient states she has been experiencing dizziness and she assumes this medication may be the cause. Although the states it may be because her BP has been low as well. She states she held it today and yesterday because her BP was low and she has been feeling a little better. She states whenever she gets dizzy it causes her to become anxious and then her BP rises. She is unsure whether or not this medication needs to be adjusted. Please advise.  STAT if patient feels like he/she is going to faint   Are you dizzy now?  No   Do you feel faint or have you passed out?  No   Do you have any other symptoms?  Patient states when she becomes dizzy she also gets really anxious which causes her BP to rise  Have you checked your HR and BP (record if available)?  11/21: 100/61 11/22: 113/67

## 2021-03-19 NOTE — Telephone Encounter (Addendum)
Discussed with patient and she stated that she is already taking Losartan 50 mg 1/2 tablet daily  She did not take yesterday but took today with dizziness about 30 minutes after taking  Will forward to [place] for review

## 2021-03-20 NOTE — Telephone Encounter (Signed)
Spoke with patient - she notes BP goes up with anxiety, then takes losartan and feels dizzy.   Wondering if as the anxiety decreases her pressure drops to normal range, then losartan is causing her to drop lower.    Advised that she stop losartan altogether for now, use her alprazolam as needed for anxiety and only check BP when anxiety not at peak.  Patient voiced understanding.

## 2021-03-20 NOTE — Addendum Note (Signed)
Addended by: Rockne Menghini on: 03/20/2021 10:49 AM   Modules accepted: Orders

## 2021-04-04 ENCOUNTER — Telehealth: Payer: Self-pay | Admitting: Cardiovascular Disease

## 2021-04-04 NOTE — Telephone Encounter (Signed)
Pt states that 2 days ago  she had a "weird event" of chest pain and her left arm start hurting. But these symptoms went away in less than 2 hours.she states that intermittent CP (3-5/10 pain scale) is "normal" for her "but when the left arm pain started" she though that she should call to let us know. She states that she has been taking her BP lately for [facility] and it has been low so Cyril Mourning stopped her BP medication (losartan). Bp is still running low yesterday 114/63 today 101/63 HR 70. She states that the left arm pain has stopped but the chest pain continues intermittently which is  "normal"for her. I will forward this ti Cyril Mourning to update her. Pt states that she will go to the ER if she feels needed, because of the wait there. She will continue to take her BP/HR for [facility] as recommended.

## 2021-04-04 NOTE — Telephone Encounter (Signed)
   Pt c/o of Chest Pain: STAT if CP now or developed within 24 hours  1. Are you having CP right now? No   2. Are you experiencing any other symptoms (ex. SOB, nausea, vomiting, sweating)? No   3. How long have you been experiencing CP? 2 days   4. Is your CP continuous or coming and going? coming and going  5. Have you taken Nitroglycerin? No    Pt said 2 days ago she experience chest pain, then yesterday her left arm started hurting as well, she said she took some aspirin and it only help a little bit. She wanted to know if she can get in to get EKG

## 2021-04-09 NOTE — Telephone Encounter (Signed)
Pt c/o BP issue: STAT if pt c/o blurred vision, one-sided weakness or slurred speech  1. What are your last 5 BP readings?  04/09/21 85/55 AM  04/08/21 96/58 AM 131/73 PM 04/07/21 101/68 AM 133/71 OM 04/06/21 96/54 AM 113/63 PM 04/05/21 99/60 AM 118/62 PM  2. Are you having any other symptoms (ex. Dizziness, headache, blurred vision, passed out)? Left arm hurting for a couple of days   3. What is your BP issue? Hypotension. Was advised to take BP for 3 weeks and report readings.

## 2021-04-09 NOTE — Telephone Encounter (Signed)
Spoke with the patient who is calling back with her recent blood pressure readings. She states that she has not had any dizziness or lightheadedness. She does still have some slight pain in her left arm. She states that she gets anxious when her blood pressure gets low. She has been advised to use her xanax as needed for her anxiety. Advised her to make sure she is staying hydrated and let us know if she develops any lightheadedness or dizziness. Advised that she could eat a small salty snack for low pressures. Patient verbalized understanding.

## 2021-04-10 NOTE — Telephone Encounter (Signed)
Apparently patient is no longer on losartan or atenolol.  Have her monitor her blood pressure.  I will see her for her office visit in January

## 2021-04-11 NOTE — Telephone Encounter (Signed)
Called patient, advised of message from MD.  Patient verbalized. She will keep BP log and bring to appointment in January.

## 2021-05-03 ENCOUNTER — Encounter: Payer: Self-pay | Admitting: Cardiovascular Disease

## 2021-05-03 DIAGNOSIS — E785 Hyperlipidemia, unspecified: Secondary | ICD-10-CM

## 2021-05-03 DIAGNOSIS — I10 Essential (primary) hypertension: Secondary | ICD-10-CM

## 2021-05-03 DIAGNOSIS — Z79899 Other long term (current) drug therapy: Secondary | ICD-10-CM

## 2021-05-06 LAB — CBC
Hematocrit: 42.5 % (ref 34.0–46.6)
Hemoglobin: 13.9 g/dL (ref 11.1–15.9)
MCH: 29 pg (ref 26.6–33.0)
MCHC: 32.7 g/dL (ref 31.5–35.7)
MCV: 89 fL (ref 79–97)
Platelets: 187 10*3/uL (ref 150–450)
RBC: 4.8 x10E6/uL (ref 3.77–5.28)
RDW: 12.3 % (ref 11.7–15.4)
WBC: 4.7 10*3/uL (ref 3.4–10.8)

## 2021-05-06 LAB — COMPREHENSIVE METABOLIC PANEL
ALT: 24 IU/L (ref 0–32)
AST: 30 IU/L (ref 0–40)
Albumin/Globulin Ratio: 1.9 (ref 1.2–2.2)
Albumin: 4.7 g/dL (ref 3.8–4.8)
Alkaline Phosphatase: 111 IU/L (ref 44–121)
BUN/Creatinine Ratio: 12 (ref 12–28)
BUN: 9 mg/dL (ref 8–27)
Bilirubin Total: 0.6 mg/dL (ref 0.0–1.2)
CO2: 24 mmol/L (ref 20–29)
Calcium: 10.3 mg/dL (ref 8.7–10.3)
Chloride: 104 mmol/L (ref 96–106)
Creatinine, Ser: 0.75 mg/dL (ref 0.57–1.00)
Globulin, Total: 2.5 g/dL (ref 1.5–4.5)
Glucose: 99 mg/dL (ref 70–99)
Potassium: 4.8 mmol/L (ref 3.5–5.2)
Sodium: 142 mmol/L (ref 134–144)
Total Protein: 7.2 g/dL (ref 6.0–8.5)
eGFR: 91 mL/min/{1.73_m2} (ref >59)

## 2021-05-06 LAB — TSH: TSH: 5.27 u[IU]/mL — ABNORMAL HIGH (ref 0.450–4.500)

## 2021-05-06 LAB — LIPID PANEL
Chol/HDL Ratio: 2 ratio (ref 0.0–4.4)
Cholesterol, Total: 163 mg/dL (ref 100–199)
HDL: 83 mg/dL (ref >39)
LDL Chol Calc (NIH): 69 mg/dL (ref 0–99)
Triglycerides: 54 mg/dL (ref 0–149)
VLDL Cholesterol Cal: 11 mg/dL (ref 5–40)

## 2021-05-09 ENCOUNTER — Encounter: Payer: Self-pay | Admitting: Cardiovascular Disease

## 2021-05-09 ENCOUNTER — Ambulatory Visit: Payer: BC Managed Care – PPO | Admitting: Cardiovascular Disease

## 2021-05-09 ENCOUNTER — Other Ambulatory Visit: Payer: Self-pay

## 2021-05-09 DIAGNOSIS — R002 Palpitations: Secondary | ICD-10-CM

## 2021-05-09 DIAGNOSIS — E785 Hyperlipidemia, unspecified: Secondary | ICD-10-CM

## 2021-05-09 DIAGNOSIS — K219 Gastro-esophageal reflux disease without esophagitis: Secondary | ICD-10-CM

## 2021-05-09 DIAGNOSIS — I10 Essential (primary) hypertension: Secondary | ICD-10-CM

## 2021-05-09 DIAGNOSIS — Z79899 Other long term (current) drug therapy: Secondary | ICD-10-CM

## 2021-05-09 MED ORDER — SIMVASTATIN 40 MG PO TABS: 40.0000 mg | ORAL_TABLET | Freq: Every day | ORAL | 6 refills | Status: DC

## 2021-05-09 MED ORDER — METOPROLOL SUCCINATE ER 25 MG PO TB24: 12.5000 mg | ORAL_TABLET | Freq: Every day | ORAL | 6 refills | Status: DC

## 2021-05-09 NOTE — Progress Notes (Signed)
Patient ID: Grace George, female   DOB: 17-Jul-1960, 61 y.o.   MRN: [id number]     HPI: Grace George is a 61 y.o. female who presents to the office for a 13 month follow-up cardiology evaluation.  Grace George has a history of hypertension, hyperlipidemia, and palpitations and has been treated with beta blocker therapy.  An echo Doppler study in 2009, showed normal systolic and diastolic function and she did have borderline mitral valve prolapse.  She has been on atenolol 50 mg daily for her palpitations.  She also has been on simvastatin 40 mg for hyperlipidemia.  Laboratory on 08/02/2014 revealed a hemoglobin of 13.1, hematocrit 39.9.  A competent metabolic panel was entirely normal.  She had normal renal function and LFTs.  Lipid studies were excellent with a total cholesterol 133, triglycerides 60, HDL 60, and LDL 61.  TSH was normal.  In 2017 she went to the emergency room complaints of shortness of breath.  She denied any exertional symptoms.  Previously, she had been walking on a treadmill for at least 35-40 minutes without discomfort. It was felt that possibly her symptoms were due to allergies.  Since I last saw her, she underwent an echo Doppler study on 10/03/2015.  This revealed normal LV function, infection fraction of 55% without regional wall motion and her maladies.  There was grade 2 diastolic dysfunction.  There was flattening mitral valve closure without frank prolapse and trivial TR.  She had normal pulmonary pressures.  I saw her in May 2018, she has continued to do well from a cardiac standpoint.  Typically her blood pressures have ranged in the 476 range systolically.  She has been maintained on a atenolol 50 mg daily with control of palpitations.  She is on simvastatin 40 mg for hyperlipidemia.  She was recently started on Linzess for chronic constipation but does not seem to have significant benefit from this but was just initiated.  She was seen by her primary physician  earlier in the year with some situational depression.  Her husband was diagnosed with cancer.   I saw her in October 2020.  At that time she was remaining stable from a cardiovascular standpoint.  She was having at times some episodes of anxiety which resulted in stress mediated palpitations.  She denied any chest tightness, PND, orthopnea.  She had undergone laboratory in August 2020.  At that time LDL cholesterol was 54 dose of simvastatin.Hemoglobin A1c was stable at 5.5.  Her vitamin D had increased to 40.28.  Thyroid function studies, chemistry and CBC studies were stable.    Over the past year, Grace George has remained stable.  However recently she began to notice some nonexertional left arm discomfort and also blood pressure elevation.  She saw her primary physician Dr. Pricilla Holm on April 05, 2020.  At that time her blood pressure was elevated at 160/84 and losartan 50 mg daily was added to her atenolol 50 mg daily regimen.  She has been on losartan now for 1 week and has noticed blood pressure improvement when she takes her blood pressure at home with typical blood pressures ranging from 546-503 systolically.  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  She continues to be on simvastatin 40 mg for hyperlipidemia and pantoprazole for GERD.  She denies any chest tightness or pressure.  Her arm discomfort has resolved.    Since I last saw her, she apparently broke her kneecap in  early 2022 and has not been working.  Apparently, he is no longer taking atenolol and apparently her blood pressure was low and she is no longer taking losartan.  She denies any recent chest pain.  She continues to note episodic palpitations.  She has been on pantoprazole 40 mg as needed and continues to be on simvastatin 40 mg daily for hyperlipidemia.  She has a prescription for alprazolam to take as needed for anxiety.  She presents for evaluation.  Past Medical History:   Diagnosis Date   Abnormal Pap smear    Allergic rhinitis    Allergy    Anemia    Anxiety    GERD (gastroesophageal reflux disease)    History of shingles 1/15   History of stress test 06/2011   Abnormal Myocadial perfusion scan demomstrating an attenuation defect in the anterior region of the myocardium, No ischemia or infaract/scar is seenin the remaining myocardium. Excercise capacity 12 METS.   HTN (hypertension)    Hx of echocardiogram 2009   Showed normal systolic and diastolic function. she does have borderline mitral valve prolapse.   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   Post-operative nausea and vomiting     Past Surgical History:  Procedure Laterality Date   CESAREAN SECTION     twice   COLONOSCOPY     CRYOABLATION     times 2 for abn paps   ESOPHAGOGASTRODUODENOSCOPY     HYSTEROSCOPY     LIGAMENT REPAIR Right 05/24/2020   Procedure: COLLATERAL LIGAMENT REPAIR BOTH SIDES;  Surgeon: Hiram Gash, MD;  Location: [place];  Service: Orthopedics;  Laterality: Right;   PATELLAR TENDON REPAIR Right 05/24/2020   Procedure: PATELLA TENDON REPAIR;  Surgeon: Hiram Gash, MD;  Location: [place];  Service: Orthopedics;  Laterality: Right;   PATELLECTOMY Right 05/24/2020   Procedure: PATELLECTOMY;  Surgeon: Hiram Gash, MD;  Location: [place];  Service: Orthopedics;  Laterality: Right;   WRIST SURGERY     right    Allergies  Allergen Reactions   Eggs Or Egg-Derived Products     Causes a rash, if she eats a lot!   Sulfa Antibiotics     Rash & itching    Current Outpatient Medications  Medication Sig Dispense Refill   ALPRAZolam (XANAX) 0.5 MG tablet TAKE 1 TABLET (0.5 MG TOTAL) BY MOUTH 2 (TWO) TIMES DAILY AS NEEDED FOR ANXIETY. 60 tablet 1   calcium carbonate (OS-CAL) 1250 (500 Ca) MG  chewable tablet Chew 1 tablet by mouth daily.     levocetirizine (XYZAL) 5 MG tablet TAKE 1 TABLET BY MOUTH EVERY DAY IN THE EVENING 90 tablet 3   metoprolol succinate (TOPROL-XL) 25 MG 24 hr tablet Take 0.5 tablets (12.5 mg total) by mouth daily. Take with or immediately following a meal. 15 tablet 6   pantoprazole (PROTONIX) 40 MG tablet Take 1 tablet (40 mg total) by mouth as needed. 90 tablet 3   simvastatin (ZOCOR) 40 MG tablet Take 1 tablet (40 mg total) by mouth daily at 6 PM. 30 tablet 6   Thiamine HCl (VITAMIN B-1) 250 MG tablet Take 250 mg by mouth daily.     Vitamin D, Ergocalciferol, (DRISDOL) 1.25 MG (50000 UNIT) CAPS capsule Take 50,000 Units by mouth once a week.  No current facility-administered medications for this visit.    Social History   Socioeconomic History   Marital status: Married    Spouse name: Not on file   Number of children: 2   Years of education: Not on file   Highest education level: Not on file  Occupational History    Employer: [employer]  Tobacco Use   Smoking status: Never   Smokeless tobacco: Never  Substance and Sexual Activity   Alcohol use: No    Alcohol/week: 0.0 standard drinks   Drug use: No   Sexual activity: Not Currently    Partners: Male    Birth control/protection: Post-menopausal  Other Topics Concern   Not on file  Social History Narrative   Married, 2 kids   Employed - [employer] - in [place]   Never tobacco, drugs   No EtOH   Social Determinants of Radio broadcast assistant Strain: Not on file  Food Insecurity: Not on file  Transportation Needs: Not on file  Physical Activity: Not on file  Stress: Not on file  Social Connections: Not on file  Intimate Partner Violence: Not on file   Updated social history:  she is married and has 2 children, currently age 43 and 57.  She has 2 grandchildren who are twins.  There is no tobacco or alcohol use.  Of note, her brother underwent cardiac  transplantation .   Family History  Problem Relation Age of Onset   Breast cancer Mother    Heart disease Mother    Diabetes Mother    Hypertension Mother    Prostate cancer Father    Diabetes Sister    Hypertension Sister    Heart disease Brother    Other Brother        CHF, had heart transplant    ROS General: Negative; No fevers, chills, or night sweats;  HEENT: Negative; No changes in vision or hearing, sinus congestion, difficulty swallowing Pulmonary:  mild shortness of breath, felt to be due to allergies Cardiovascular: Stress mediated palpitations, no chest pain or exertional dyspnea GI: History of GERD. GU: Negative; No dysuria, hematuria, or difficulty voiding Immunologic: Seasonal allergies Musculoskeletal: Negative; no myalgias, joint pain, or weakness Hematologic/Oncology: Negative; no easy bruising, bleeding Endocrine: Negative; no heat/cold intolerance; no diabetes Neuro: Occasional foot numbness Skin: Negative; No rashes or skin lesions Psychiatric: Negative; No behavioral problems, depression Sleep: Negative; No snoring, daytime sleepiness, hypersomnolence, bruxism, restless legs, hypnogognic hallucinations, no cataplexy Other comprehensive 14 point system review is negative.   PE BP 140/80    Pulse 89    Ht 5' 3" (1.6 m)    Wt 158 lb (71.7 kg)    LMP 03/28/2010    SpO2 99%    BMI 27.99 kg/m    Repeat blood pressure by me was 135/80    Wt Readings from Last 3 Encounters:  05/09/21 158 lb (71.7 kg)  12/19/20 161 lb (73 kg)  08/28/20 162 lb (73.5 kg)   General: Alert, oriented, no distress.  Skin: normal turgor, no rashes, warm and dry HEENT: Normocephalic, atraumatic. Pupils equal round and reactive to light; sclera anicteric; extraocular muscles intact;  Nose without nasal septal hypertrophy Mouth/Parynx benign; Mallinpatti scale 3 Neck: No JVD, no carotid bruits; normal carotid upstroke Lungs: clear to ausculatation and percussion; no wheezing or  rales Chest wall: without tenderness to palpitation Heart: PMI not displaced, regular rhythm in the upper 80s to 90s with frequent ectopy,  s1 s2 normal, 1/6 systolic murmur, no diastolic murmur, no rubs, gallops, thrills, or heaves Abdomen: soft, nontender; no hepatosplenomehaly, BS+; abdominal aorta nontender and not dilated by palpation. Back: no CVA tenderness Pulses 2+ Musculoskeletal: full range of motion, normal strength, no joint deformities Extremities: no clubbing cyanosis or edema, Homan's sign negative  Neurologic: grossly nonfocal; Cranial nerves grossly wnl Psychologic: Normal mood and affect  May 09, 2021 ECG (independently read by me): Sinus rhythm at 89, sinus arrythmia  April 12, 2020 ECG (independently read by me): Normal sinus rhythm at 60 bpm.  Mild first-degree AV block with a PR interval of 210 ms.  No ectopy.  October 2020 ECG (independently read by me): Sinus Bradycardia at 53, PAV First degree AV block; PR 222 ms  June 2019 ECG (independently read by me): Sinus bradycardia 52 bpm.  Nonspecific T change.  Normal intervals.  Borderline first-degree AV block with a PR interval of 204 ms.  May 2018 ECG (independently read by me): Sinus bradycardia 54 bpm..  Borderline First-degree AV block with a PR interval of 202 ms.  May 2017 ECG (independently read by me): Sinus bradycardia 55 bpm.  First degree AV block with a PR interval of 204 ms.  April 2016 ECG (independently read by me): Normal sinus rhythm.  Mild RV conduction delay with T-wave inversion V1 and V2, isolated PAC.  Borderline first-degree AV block with a PR interval at 22 ms.  April 2015 ECG (independently read by me): Sinus rhythm at 72 beats per minute with mild RV conduction delay.  She is previously noted T-wave inversion in leads V1 through V3.  She also has occasional PACs with different morphology to the P wave in these premature complexes.  QTc interval 47 ms.  PR interval 188 ms.  LABS:  BMP  Latest Ref Rng & Units 05/06/2021 08/28/2020 04/12/2020  Glucose 70 - 99 mg/dL 99 97 104(H)  BUN 8 - 27 mg/dL _0 Creatinine 0.57 - 1.00 mg/dL 0.75 0.73 0.73  BUN/Creat Ratio 12 - 28 12 - 14  Sodium 134 - 144 mmol/L 142 139 140  Potassium 3.5 - 5.2 mmol/L 4.8 4.5 4.7  Chloride 96 - 106 mmol/L 104 104 104  CO2 20 - 29 mmol/L _1 Calcium 8.7 - 10.3 mg/dL 10.3 10.0 9.6     Hepatic Function Latest Ref Rng & Units 05/06/2021 08/28/2020 04/12/2020  Total Protein 6.0 - 8.5 g/dL 7.2 7.5 6.9  Albumin 3.8 - 4.8 g/dL 4.7 4.6 4.6  AST 0 - 40 IU/L _2 ALT 0 - 32 IU/L _3 Alk Phosphatase 44 - 121 IU/L 111 93 106  Total Bilirubin 0.0 - 1.2 mg/dL 0.6 0.5 0.4     CBC Latest Ref Rng & Units 05/06/2021 04/12/2020 12/07/2018  WBC 3.4 - 10.8 x10E3/uL 4.7 4.6 3.8(L)  Hemoglobin 11.1 - 15.9 g/dL 13.9 13.4 13.4  Hematocrit 34.0 - 46.6 % 42.5 40.6 40.5  Platelets 150 - 450 x10E3/uL 187 152 144.0(L)   Lab Results  Component Value Date   TSH 5.270 (H) 05/06/2021    BNP No results found for: PROBNP  Lipid Panel     Component Value Date/Time   CHOL 163 05/06/2021 0954   TRIG 54 05/06/2021 0954   HDL 83 05/06/2021 0954   CHOLHDL 2.0 05/06/2021 0954   CHOLHDL 2 12/07/2018 0909   VLDL 10.4 12/07/2018 0909   LDLCALC 69 05/06/2021 0954     RADIOLOGY:  No results found.  IMPRESSION:  1. Palpitations   2. Hyperlipidemia, unspecified hyperlipidemia type   3. Essential hypertension   4. Gastroesophageal reflux disease without esophagitis   5. Medication management     ASSESSMENT AND PLAN: Grace George is a very pleasant 61 year-old female who has a history of hypertension, hyperlipidemia, and palpitations.  In the past, her palpitations had stabilized on atenolol.  Palpitations often were anxiety mediated.  She had been on losartan in the past but apparently her palpitations have stabilized on a atenolol.  In the past, palpitations were often anxiety mediated.  She most  recently had been on a regimen of losartan and atenolol for blood pressure control.  Apparently she began to notice low blood pressure with associated dizziness.  As result as I last saw her, she has been taken off both of her medications.  Blood pressure did increase and she had resolution of her prior dizziness.  However she now experiences occasional palpitations.  Exam today with auscultation she did have fairly frequent heart rate irregularity.  She denies any chest pain or shortness of breath.  With her pulse around 90 and her sinus arrhythmia I have suggested reinitiation of low-dose beta-blocker therapy and will start her on metoprolol succinate at just 12.5 mg daily.  Remotely she had been on atenolol 50 mg.  She will monitor her blood pressure.  I reviewed recent laboratory from January 9.  TSH was minimally increased at 5.27.  Chemistry, CBC were normal.  Lipid studies were excellent with LDL cholesterol at 69.  She has continued to be on simvastatin 40 mg.  She is on pantoprazole for GERD.  At times she is appearance is nonexertional left arm ache which most likely is musculoskeletal in etiology.  I will see her in 6 months for follow-up evaluation or sooner as needed.  Troy Sine, MD, [facility]  05/09/2021 7:08 PM

## 2021-05-09 NOTE — Patient Instructions (Signed)
Medication Instructions:  START METOPROLOL SUCCINATE 12.5MG  (1/2 TAB) DAILY  *If you need a refill on your cardiac medications before your next appointment, please call your pharmacy*  Lab Work:   Testing/Procedures:  NONE    NONE  Follow-Up: Your next appointment:  6 month(s) In Person with Shelva Majestic, MD   Please call our office 2 months in advance to schedule this appointment   At [facility], you and your health needs are our priority.  As part of our continuing mission to provide you with exceptional heart care, we have created designated Provider Care Teams.  These Care Teams include your primary Cardiologist (physician) and Advanced Practice Providers (APPs -  Physician Assistants and Nurse Practitioners) who all work together to provide you with the care you need, when you need it.

## 2021-05-21 ENCOUNTER — Encounter: Payer: Self-pay | Admitting: Cardiovascular Disease

## 2021-05-21 NOTE — Telephone Encounter (Signed)
Per last MD note:  With her pulse around 90 and her sinus arrhythmia I have suggested reinitiation of low-dose beta-blocker therapy and will start her on metoprolol succinate at just 12.5 mg daily.  Remotely she had been on atenolol 50 mg.  She will monitor her blood pressure.

## 2021-05-21 NOTE — Telephone Encounter (Signed)
Rollen Sox, [facility]  You 26 minutes ago (3:30 PM)   It looks like the only BP medication she is on is metoprolol 12.5mg .  Recommend she hold for now and see if BP and pulse rate recover

## 2021-05-24 NOTE — Telephone Encounter (Signed)
-  Pt state she feels ok -She report today was the first time her HR was 38 using blood pressure machine. After a few minutes she rechecked and it increased to 68.   - HR currently 66 using pulse ox  Will forward to pharmacist to make aware.

## 2021-05-24 NOTE — Telephone Encounter (Signed)
Pt updated and verbalized understanding.   Pavero, Harrell Gave, [facility]  You 15 minutes ago (5:01 PM)   Recommend patient continue to stay off metoprolol and continue to monitor over the weekend.  I trust the reading she is getting with her pulse ox more than with her cuff.  If pulse ox reading drops low and patient is symptomatic, recommend ER for evaluation

## 2021-05-28 ENCOUNTER — Other Ambulatory Visit: Payer: Self-pay

## 2021-05-28 ENCOUNTER — Telehealth: Payer: Self-pay

## 2021-05-28 MED ORDER — METOPROLOL SUCCINATE ER 25 MG PO TB24: 12.5000 mg | ORAL_TABLET | Freq: Two times a day (BID) | ORAL | 3 refills | Status: DC

## 2021-05-28 NOTE — Telephone Encounter (Signed)
Spoke with patient to inform her that Dr. [name] has increased her metoprolol succinate to 12.4m twice daily. She is to monitor her bp and p and to let [place] if her heart rate goes below 55. Pt bp this am 126/65,P 116. She stated her heart raced last night while in bed. Order placed for met succ.

## 2021-05-29 NOTE — Telephone Encounter (Signed)
Discussed with [facility], they will see her to discuss and go over medications.

## 2021-05-30 ENCOUNTER — Encounter: Payer: Self-pay | Admitting: Cardiovascular Disease

## 2021-05-30 NOTE — Telephone Encounter (Signed)
Spoke with patient who reports being afraid to take metoprolol because it made her pulse go to "36". She reports that the palpitations are strong and bother her. While on the phone her blood pressure was 152/80, Pulse 102, O2 sat 99%. She took metoprolol succinate 12.5 mg and will recheck blood pressure, pulse and oxygen at 1 pm. Pt will send those numbers to Mychart and include if the medication calmed her palpitations. I told her to be sure that her fingers are warm when using the pulse oximeter and when taking blood pressure, sit in a chair and have feet flat on the floor.

## 2021-05-31 MED ORDER — METOPROLOL TARTRATE 25 MG PO TABS: ORAL_TABLET | ORAL | 3 refills | Status: DC

## 2021-05-31 NOTE — Telephone Encounter (Signed)
Discussed with Blima Ledger NP and will have you change the Metoprolol to Metoprolol tart 25 mg 1/2 tablet twice a day. Hold Metoprolol if heart rate (pulse) is less than 60 on your blood pressure machine. Keep follow up as scheduled  Spoke with patient and sent mychart message

## 2021-06-08 ENCOUNTER — Other Ambulatory Visit: Payer: Self-pay | Admitting: General Practice

## 2021-06-17 ENCOUNTER — Encounter (HOSPITAL_BASED_OUTPATIENT_CLINIC_OR_DEPARTMENT_OTHER): Payer: Self-pay

## 2021-06-17 ENCOUNTER — Emergency Department (HOSPITAL_BASED_OUTPATIENT_CLINIC_OR_DEPARTMENT_OTHER): Payer: BC Managed Care – PPO | Admitting: Radiology

## 2021-06-17 ENCOUNTER — Emergency Department (HOSPITAL_BASED_OUTPATIENT_CLINIC_OR_DEPARTMENT_OTHER)
Admission: EM | Admit: 2021-06-17 | Discharge: 2021-06-17 | Disposition: A | Discharge status: Home / Self Care | Payer: BC Managed Care – PPO | Attending: Emergency Medicine | Admitting: Emergency Medicine

## 2021-06-17 ENCOUNTER — Other Ambulatory Visit: Payer: Self-pay

## 2021-06-17 DIAGNOSIS — F419 Anxiety disorder, unspecified: Secondary | ICD-10-CM | POA: Insufficient documentation

## 2021-06-17 DIAGNOSIS — R002 Palpitations: Secondary | ICD-10-CM | POA: Diagnosis present

## 2021-06-17 DIAGNOSIS — Z7982 Long term (current) use of aspirin: Secondary | ICD-10-CM | POA: Insufficient documentation

## 2021-06-17 DIAGNOSIS — I10 Essential (primary) hypertension: Secondary | ICD-10-CM | POA: Diagnosis not present

## 2021-06-17 DIAGNOSIS — I4891 Unspecified atrial fibrillation: Secondary | ICD-10-CM | POA: Insufficient documentation

## 2021-06-17 LAB — BASIC METABOLIC PANEL
Anion gap: 11 (ref 5–15)
BUN: 10 mg/dL (ref 8–23)
CO2: 23 mmol/L (ref 22–32)
Calcium: 10.2 mg/dL (ref 8.9–10.3)
Chloride: 106 mmol/L (ref 98–111)
Creatinine, Ser: 0.6 mg/dL (ref 0.44–1.00)
GFR, Estimated: >60 mL/min (ref >60)
Glucose, Bld: 132 mg/dL — ABNORMAL HIGH (ref 70–99)
Potassium: 3.5 mmol/L (ref 3.5–5.1)
Sodium: 140 mmol/L (ref 135–145)

## 2021-06-17 LAB — CBC
HCT: 41.4 % (ref 36.0–46.0)
Hemoglobin: 13.7 g/dL (ref 12.0–15.0)
MCH: 29.6 pg (ref 26.0–34.0)
MCHC: 33.1 g/dL (ref 30.0–36.0)
MCV: 89.4 fL (ref 80.0–100.0)
Platelets: 180 10*3/uL (ref 150–400)
RBC: 4.63 MIL/uL (ref 3.87–5.11)
RDW: 13.2 % (ref 11.5–15.5)
WBC: 5.7 10*3/uL (ref 4.0–10.5)
nRBC: 0 % (ref 0.0–0.2)

## 2021-06-17 MED ORDER — MIDAZOLAM HCL 2 MG/2ML IJ SOLN
1.0000 mg | Freq: Once | INTRAMUSCULAR | Status: AC
  Administered 2021-06-17: 1 mg via INTRAVENOUS
  Filled 2021-06-17: qty 2

## 2021-06-17 MED ORDER — METOCLOPRAMIDE HCL 5 MG/ML IJ SOLN
10.0000 mg | Freq: Once | INTRAMUSCULAR | Status: AC
  Administered 2021-06-17: 10 mg via INTRAVENOUS
  Filled 2021-06-17: qty 2

## 2021-06-17 MED ORDER — ASPIRIN 81 MG PO CHEW: 81.0000 mg | CHEWABLE_TABLET | Freq: Every day | ORAL | 0 refills | Status: DC

## 2021-06-17 MED ORDER — KETOROLAC TROMETHAMINE 15 MG/ML IJ SOLN
15.0000 mg | Freq: Once | INTRAMUSCULAR | Status: AC
  Administered 2021-06-17: 15 mg via INTRAVENOUS
  Filled 2021-06-17: qty 1

## 2021-06-17 NOTE — ED Triage Notes (Signed)
Having high bloop pressure.  Onset two days.  Associated with headache.  States came in today because was very high 170/108

## 2021-06-17 NOTE — ED Notes (Signed)
Patient transported to X-ray 

## 2021-06-17 NOTE — Discharge Instructions (Addendum)
You are seen in the ER for palpitations and elevated blood pressure.  Or extensive monitoring in the ED, it appears that you might have been going out of rhythm called atrial fibrillation.  Please follow-up with Dr. [name] for further evaluation.  Start taking baby aspirin every day.

## 2021-06-18 ENCOUNTER — Encounter: Payer: Self-pay | Admitting: Cardiovascular Disease

## 2021-06-18 NOTE — Progress Notes (Signed)
Cardiology Office Note:    Date:  06/20/2021   ID:  Grace George, DOB May 03, 1960, MRN [id number]  PCP:  Hoyt Koch, MD   [facility] Providers Cardiologist:  Shelva Majestic, MD      Referring MD: Pricilla Holm A, *   Follow-up for atrial fibrillation and essential hypertension.  History of Present Illness:    Grace George is a 61 y.o. female with a hx of HLD, HTN, and palpitations.  She had a echocardiogram in 2009 which showed normal systolic and diastolic function.  She was also noted to have borderline mitral valve prolapse.  She was started on atenolol 50 mg daily for palpitations and was also taking simvastatin.  She followed up with Dr. [name] 2022 and continues to be stable from a cardiac standpoint.  She did note some nonexertional left arm discomfort and reported elevated blood pressure.  She presented to her PCP and her blood pressure was found to be 160/84.  Losartan 50 mg daily was added to her atenolol antihypertensive medication regimen.  She took losartan for approximately 1 week and reported blood pressures in 937-169 systolic range.  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  She was seen in follow-up by Dr. [name] on 05/09/2021.  During that time she reported that she had broken her kneecap in early 2022.  She had not been working.  She was no longer taking atenolol or losartan.  She reported that her blood pressure had been low.  She denied chest discomfort.  She continued to have periods of palpitations.  She reported compliance with her simvastatin and pantoprazole.  She had also been prescribed alprazolam as needed for anxiety.  Her blood pressure was 140/80.  She was started on metoprolol succinate 12.5 mg daily and follow-up was planned for 6 months.  She presented to the emergency department 06/17/2021.  Her blood pressure at that time was noted to be 170/108.  She reported that her blood pressure has been elevated for 2 days and she  had an associated headache.  She was contacted by the nursing triage line on 06/18/2021 and reported that her blood pressure was 118/71.  She presents to the clinic today for follow-up evaluation states she was recently in the emergency department with elevated blood pressure and anxiety.  Her EKG at that time showed atrial fibrillation and then on repeat showed sinus rhythm with arrhythmia.  Her EKG today shows sinus tachycardia with premature atrial complexes 105 bpm.  We reviewed her previous EKGs and it does not appear that she had any atrial fibrillation.  She expressed understanding.  She has been very anxious about her neuropathy, palpitations, and her general health.  She noted that her pulse was in the 50s with Toprol tartrate and she discontinued the medication.  We used shared decision-making to discuss options for treatment.  I will place her on propranolol 10 mg twice daily, have her check her blood pressure and pulse 3-4 times per week.  I have asked her to manually check her pulse for accuracy.  I will also give her the mindfulness stress reduction sheet and plan follow-up for 1 month.  Today she denies chest pain, shortness of breath, lower extremity edema, fatigue, palpitations, melena, hematuria, hemoptysis, diaphoresis, weakness, presyncope, syncope, orthopnea, and PND.    Past Medical History:  Diagnosis Date   Abnormal Pap smear    Allergic rhinitis    Allergy    Anemia    Anxiety  GERD (gastroesophageal reflux disease)    History of shingles 1/15   History of stress test 06/2011   Abnormal Myocadial perfusion scan demomstrating an attenuation defect in the anterior region of the myocardium, No ischemia or infaract/scar is seenin the remaining myocardium. Excercise capacity 12 METS.   HTN (hypertension)    Hx of echocardiogram 2009   Showed normal systolic and diastolic function. she does have borderline mitral valve prolapse.   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   Post-operative nausea and vomiting     Past Surgical History:  Procedure Laterality Date   CESAREAN SECTION     twice   COLONOSCOPY     CRYOABLATION     times 2 for abn paps   ESOPHAGOGASTRODUODENOSCOPY     HYSTEROSCOPY     LIGAMENT REPAIR Right 05/24/2020   Procedure: COLLATERAL LIGAMENT REPAIR BOTH SIDES;  Surgeon: Hiram Gash, MD;  Location: [place];  Service: Orthopedics;  Laterality: Right;   PATELLAR TENDON REPAIR Right 05/24/2020   Procedure: PATELLA TENDON REPAIR;  Surgeon: Hiram Gash, MD;  Location: [facility];  Service: Orthopedics;  Laterality: Right;   PATELLECTOMY Right 05/24/2020   Procedure: PATELLECTOMY;  Surgeon: Hiram Gash, MD;  Location: [place];  Service: Orthopedics;  Laterality: Right;   WRIST SURGERY     right    Current Medications: Current Meds  Medication Sig   ALPRAZolam (XANAX) 0.5 MG tablet TAKE 1 TABLET (0.5 MG TOTAL) BY MOUTH 2 (TWO) TIMES DAILY AS NEEDED FOR ANXIETY.   aspirin 81 MG chewable tablet Chew 1 tablet (81 mg total) by mouth daily.   calcium carbonate (OS-CAL) 1250 (500 Ca) MG chewable tablet Chew 1 tablet by mouth daily.   levocetirizine (XYZAL) 5 MG tablet TAKE 1 TABLET BY MOUTH EVERY DAY IN THE EVENING   pantoprazole (PROTONIX) 40 MG tablet Take 1 tablet (40 mg total) by mouth as needed.   simvastatin (ZOCOR) 40 MG tablet Take 1 tablet (40 mg total) by mouth daily at 6 PM.     Allergies:   Eggs or egg-derived products and Sulfa antibiotics   Social History   Socioeconomic History   Marital status: Married    Spouse name: Not on file   Number of children: 2   Years of education: Not on file   Highest education level: Not on file  Occupational History    Employer: [employer]  Tobacco Use   Smoking status: Never   Smokeless tobacco: Never   Substance and Sexual Activity   Alcohol use: No    Alcohol/week: 0.0 standard drinks   Drug use: No   Sexual activity: Not Currently    Partners: Male    Birth control/protection: Post-menopausal  Other Topics Concern   Not on file  Social History Narrative   Married, 2 kids   Employed - [employer] - in [place]   Never tobacco, drugs   No EtOH   Social Determinants of Radio broadcast assistant Strain: Not on file  Food Insecurity: Not on file  Transportation Needs: Not on file  Physical Activity: Not on file  Stress: Not on file  Social Connections: Not on file     Family History: The patient's family history includes Breast cancer in  her mother; Diabetes in her mother and sister; Heart disease in her brother and mother; Hypertension in her mother and sister; Other in her brother; Prostate cancer in her father.  ROS:   Please see the history of present illness.     All other systems reviewed and are negative.   Risk Assessment/Calculations:           Physical Exam:    VS:  BP 124/72 (BP Location: Right Arm, Patient Position: Sitting, Cuff Size: Normal)    Pulse (!) 105    Ht 5\' 3"  (1.6 m)    Wt 160 lb 9.6 oz (72.8 kg)    LMP 03/28/2010    BMI 28.45 kg/m     Wt Readings from Last 3 Encounters:  06/20/21 160 lb 9.6 oz (72.8 kg)  06/17/21 158 lb (71.7 kg)  05/09/21 158 lb (71.7 kg)     GEN:  Well nourished, well developed in no acute distress HEENT: Normal NECK: No JVD; No carotid bruits LYMPHATICS: No lymphadenopathy CARDIAC: RRR, no murmurs, rubs, gallops RESPIRATORY:  Clear to auscultation without rales, wheezing or rhonchi  ABDOMEN: Soft, non-tender, non-distended MUSCULOSKELETAL:  No edema; No deformity  SKIN: Warm and dry NEUROLOGIC:  Alert and oriented x 3 PSYCHIATRIC:  Normal affect    EKGs/Labs/Other Studies Reviewed:    The following studies were reviewed today:  Echocardiogram 10/03/2015  Study Conclusions   - Left  ventricle: The cavity size was normal. Wall thickness was    normal. The estimated ejection fraction was 55%. Wall motion was    normal; there were no regional wall motion abnormalities.    Features are consistent with a pseudonormal left ventricular    filling pattern, with concomitant abnormal relaxation and    increased filling pressure (grade 2 diastolic dysfunction).  - Aortic valve: There was no stenosis.  - Mitral valve: Somewhat flattened closure of the mitral valve    without frank prolapse. There was trivial regurgitation.  - Left atrium: The atrium was mildly dilated.  - Right ventricle: The cavity size was normal. Systolic function    was normal.  - Tricuspid valve: Peak RV-RA gradient (S): 22 mm Hg.  - Pulmonary arteries: PA peak pressure: 25 mm Hg (S).  - Inferior vena cava: The vessel was normal in size. The    respirophasic diameter changes were in the normal range (>= 50%),    consistent with normal central venous pressure.   Impressions:   - Normal LV size with EF 55%. Flattened closure of the mitral valve    without frank prolapse. Trivial mitral regurgitation. Normal RV    size and systolic function.   EKG:  EKG is  ordered today.  The ekg ordered today demonstrates normal sinus tachycardia with premature atrial complexes with aberrant conduction 105 bpm  Recent Labs: 05/06/2021: ALT 24; TSH 5.270 06/17/2021: BUN 10; Creatinine, Ser 0.60; Hemoglobin 13.7; Platelets 180; Potassium 3.5; Sodium 140  Recent Lipid Panel    Component Value Date/Time   CHOL 163 05/06/2021 0954   TRIG 54 05/06/2021 0954   HDL 83 05/06/2021 0954   CHOLHDL 2.0 05/06/2021 0954   CHOLHDL 2 12/07/2018 0909   VLDL 10.4 12/07/2018 0909   LDLCALC 69 05/06/2021 0954    ASSESSMENT & PLAN    Essential hypertension-BP today 124/72.  Well-controlled on.  Had recent visit to the emergency department when her blood pressure was noted to be 170/118. Continue metoprolol Heart healthy low-sodium  diet-salty 6 given Increase physical activity  as tolerated  Palpitations-heart rate today 105.  Denies recent episodes of increased or irregular heartbeat. Continue metoprolol  Heart healthy low-sodium diet Increase physical activity as tolerated Avoid triggers caffeine, chocolate, EtOH, dehydration etc.  Hyperlipidemia-05/06/2021: Cholesterol, Total 163; HDL 83; LDL Chol Calc (NIH) 69; Triglycerides 54 Continue aspirin, simvastatin Heart healthy low-sodium high-fiber diet Increase physical activity as tolerated  Anxiety-taking Xanax as needed. Mindfulness stress reduction sheet given Follows with PCP  Disposition: Follow-up with Dr. [name] in 1-2 months.        Medication Adjustments/Labs and Tests Ordered: Current medicines are reviewed at length with the patient today.  Concerns regarding medicines are outlined above.  No orders of the defined types were placed in this encounter.  No orders of the defined types were placed in this encounter.   There are no Patient Instructions on file for this visit.   Signed, [name], NP  06/20/2021 2:38 PM      Notice: This dictation was prepared with Dragon dictation along with smaller phrase technology. Any transcriptional errors that result from this process are unintentional and may not be corrected upon review.  I spent 14 minutes examining this patient, reviewing medications, and using patient centered shared decision making involving her cardiac care.  Prior to her visit I spent greater than 20 minutes reviewing her past medical history,  medications, and prior cardiac tests.

## 2021-06-18 NOTE — Telephone Encounter (Signed)
Called patient who reported that she was in the ED yesterday for hypertension and afib. She was placed on aspirin. She wants an appointment with Dr. [name]. BP today 118/71, P 79. Appointment was made for 2/23 with Coletta Memos, FNP

## 2021-06-20 ENCOUNTER — Ambulatory Visit (HOSPITAL_BASED_OUTPATIENT_CLINIC_OR_DEPARTMENT_OTHER): Payer: BC Managed Care – PPO | Admitting: General Practice

## 2021-06-20 ENCOUNTER — Encounter (HOSPITAL_BASED_OUTPATIENT_CLINIC_OR_DEPARTMENT_OTHER): Payer: Self-pay | Admitting: General Practice

## 2021-06-20 ENCOUNTER — Other Ambulatory Visit: Payer: Self-pay

## 2021-06-20 VITALS — BP 124/72 | HR 105 | Ht 63.0 in | Wt 160.6 lb

## 2021-06-20 DIAGNOSIS — I10 Essential (primary) hypertension: Secondary | ICD-10-CM

## 2021-06-20 DIAGNOSIS — K219 Gastro-esophageal reflux disease without esophagitis: Secondary | ICD-10-CM

## 2021-06-20 DIAGNOSIS — E785 Hyperlipidemia, unspecified: Secondary | ICD-10-CM

## 2021-06-20 DIAGNOSIS — R002 Palpitations: Secondary | ICD-10-CM

## 2021-06-20 MED ORDER — PROPRANOLOL HCL 10 MG PO TABS
10.0000 mg | ORAL_TABLET | Freq: Two times a day (BID) | ORAL | 3 refills | Status: DC
Start: 2021-06-20 — End: 2022-06-06

## 2021-06-20 NOTE — Patient Instructions (Signed)
Medication Instructions:  Your physician has recommended you make the following change in your medication:   Stop: Metroprolol   Start: Propanolol 10mg  twice daily   *If you need a refill on your cardiac medications before your next appointment, please call your pharmacy*   Follow-Up: At [facility], you and your health needs are our priority.  As part of our continuing mission to provide you with exceptional heart care, we have created designated Provider Care Teams.  These Care Teams include your primary Cardiologist (physician) and Advanced Practice Providers (APPs -  Physician Assistants and Nurse Practitioners) who all work together to provide you with the care you need, when you need it.  We recommend signing up for the patient portal called "MyChart".  Sign up information is provided on this After Visit Summary.  MyChart is used to connect with patients for Virtual Visits (Telemedicine).  Patients are able to view lab/test results, encounter notes, upcoming appointments, etc.  Non-urgent messages can be sent to your provider as well.   To learn more about what you can do with MyChart, go to [email or web address].    Your next appointment:   1 month(s)  The format for your next appointment:   In Person  Provider:   Shelva Majestic, MD  or Coletta Memos, FNP     Other Instructions Exercise recommendations: The American Heart Association recommends 150 minutes of moderate intensity exercise weekly. Try 30 minutes of moderate intensity exercise 4-5 times per week. This could include walking, jogging, or swimming.   Mindfulness-Based Stress Reduction Mindfulness-based stress reduction (MBSR) is a program that helps people learn to practice mindfulness. Mindfulness is the practice of consciously paying attention to the present moment. MBSR focuses on developing self-awareness, which lets you respond to life stress without judgment or negative feelings. It can be learned and  practiced through techniques such as education, breathing exercises, meditation, and yoga. MBSR includes several mindfulness techniques in one program. MBSR works best when you understand the treatment, are willing to try new things, and can commit to spending time practicing what you learn. MBSR training may include learning about: How your feelings, thoughts, and reactions affect your body. New ways to respond to things that cause negative thoughts to start (triggers). How to notice your thoughts and let go of them. Practicing awareness of everyday things that you normally do without thinking. The techniques and goals of different types of meditation. What are the benefits of MBSR? MBSR can have many benefits, which include helping you to: Develop self-awareness. This means knowing and understanding yourself. Learn skills and attitudes that help you to take part in your own health care. Learn new ways to care for yourself. Be more accepting about how things are, and let things go. Be less judgmental and approach things with an open mind. Be patient with yourself and trust yourself more. MBSR has also been shown to: Reduce negative emotions, such as sadness, overwhelm, and worry. Improve memory and focus. Change how you sense and react to pain. Boost your body's ability to fight infections. Help you connect better with other people. Improve your sense of well-being. How to practice mindfulness To do a basic awareness exercise: Find a comfortable place to sit. Pay attention to the present moment. Notice your thoughts, feelings, and surroundings just as they are. Avoid judging yourself, your feelings, or your surroundings. Make note of any judgment that comes up and let it go. Your mind may wander, and that is okay. Make note of  when your thoughts drift, and return your attention to the present moment. To do basic mindfulness meditation: Find a comfortable place to sit. This may include a  stable chair or a firm floor cushion. Sit upright with your back straight. Let your arms fall next to your sides, with your hands resting on your legs. If you are sitting in a chair, rest your feet flat on the floor. If you are sitting on a cushion, cross your legs in front of you. Keep your head in a neutral position with your chin dropped slightly. Relax your jaw and rest the tip of your tongue on the roof of your mouth. Drop your gaze to the floor or close your eyes. Breathe normally and pay attention to your breath. Feel the air moving in and out of your nose. Feel your belly expanding and relaxing with each breath. Your mind may wander, and that is okay. Make note of when your thoughts drift, and return your attention to your breath. Avoid judging yourself, your feelings, or your surroundings. Make note of any judgment or feelings that come up, let them go, and bring your attention back to your breath. When you are ready, lift your gaze or open your eyes. Pay attention to how your body feels after the meditation. Follow these instructions at home:  Find a local in-person or online MBSR program. Set aside some time regularly for mindfulness practice. Practice every day if you can. Even 10 minutes of practice is helpful. Find a mindfulness practice that works best for you. This may include one or more of the following: Meditation. This involves focusing your mind on a certain thought or activity. Breathing awareness exercises. These help you to stay present by focusing on your breath. Body scan. For this practice, you lie down and pay attention to each part of your body from head to toe. You can identify tension and soreness and consciously relax parts of your body. Yoga. Yoga involves stretching and breathing, and it can improve your ability to move and be flexible. It can also help you to test your body's limits, which can help you release stress. Mindful eating. This way of eating involves  focusing on the taste, texture, color, and smell of each bite of food. This slows down eating and helps you feel full sooner. For this reason, it can be an important part of a weight loss plan. Find a podcast or recording that provides guidance for breathing awareness, body scan, or meditation exercises. You can listen to these any time when you have a free moment to rest without distractions. Follow your treatment plan as told by your health care provider. This may include taking regular medicines and making changes to your diet or lifestyle as recommended. Where to find more information You can find more information about MBSR from: Your health care provider. Community-based meditation centers or programs. Programs offered near you. Summary Mindfulness-based stress reduction (MBSR) is a program that teaches you how to consciously pay attention to the present moment. It is used to help you deal better with daily stress, feelings, and pain. MBSR focuses on developing self-awareness, which allows you to respond to life stress without judgment or negative feelings. MBSR programs may involve learning different mindfulness practices, such as breathing exercises, meditation, yoga, body scan, or mindful eating. Find a mindfulness practice that works best for you, and set aside time for it on a regular basis. This information is not intended to replace advice given to you by  your health care provider. Make sure you discuss any questions you have with your health care provider. Document Revised: 11/22/2020 Document Reviewed: 11/22/2020 Elsevier Patient Education  [place].  To prevent palpitations: Make sure you are adequately hydrated.  Avoid and/or limit caffeine containing beverages like soda or tea. Exercise regularly.  Manage stress well. Some over the counter medications can cause palpitations such as Benadryl, AdvilPM, TylenolPM. Regular Advil or Tylenol do not cause palpitations.

## 2021-06-21 NOTE — ED Provider Notes (Signed)
[facility] Provider Note   CSN: [id number] Arrival date & time: 06/17/21  1721     History  Chief Complaint  Patient presents with   Hypertension    Grace George is a 61 y.o. female.  HPI      61 y.o. female with a hx of HLD, HTN, and anxiety comes in with cc of palpitations and elevated BP.  She indicates that over the last 2 days she has had increased episodes of shortness of breath, palpitations and when she checker her BP it was elevated. She then has more anxiety and associated chest discomfort and shortness of breath. She indicates some episodes of headaches.  She denies any change in her diet, change in her medications.  Denies any increased caffeine intake   Home Medications Prior to Admission medications   Medication Sig Start Date End Date Taking? Authorizing Provider  aspirin 81 MG chewable tablet Chew 1 tablet (81 mg total) by mouth daily. 06/17/21  Yes Jonnell Hentges, MD  ALPRAZolam (XANAX) 0.5 MG tablet TAKE 1 TABLET (0.5 MG TOTAL) BY MOUTH 2 (TWO) TIMES DAILY AS NEEDED FOR ANXIETY. 01/29/21   Hoyt Koch, MD  calcium carbonate (OS-CAL) 1250 (500 Ca) MG chewable tablet Chew 1 tablet by mouth daily.    [provider]  levocetirizine (XYZAL) 5 MG tablet TAKE 1 TABLET BY MOUTH EVERY DAY IN THE EVENING 08/28/20   Hoyt Koch, MD  pantoprazole (PROTONIX) 40 MG tablet Take 1 tablet (40 mg total) by mouth as needed. 08/28/20   Hoyt Koch, MD  propranolol (INDERAL) 10 MG tablet Take 1 tablet (10 mg total) by mouth 2 (two) times daily. 06/20/21   [name], NP  simvastatin (ZOCOR) 40 MG tablet Take 1 tablet (40 mg total) by mouth daily at 6 PM. 05/09/21   Troy Sine, MD      Allergies    Eggs or egg-derived products and Sulfa antibiotics    Review of Systems   Review of Systems  All other systems reviewed and are negative.  Physical Exam Updated Vital Signs BP (!) 143/63    Pulse 71     Temp 97.8 F (36.6 C) (Oral)    Resp 15    Ht 5\' 3"  (1.6 m)    Wt 71.7 kg    LMP 03/28/2010    SpO2 100%    BMI 27.99 kg/m  Physical Exam Vitals and nursing note reviewed.  Constitutional:      Appearance: She is well-developed.  HENT:     Head: Atraumatic.  Cardiovascular:     Rate and Rhythm: Normal rate.  Pulmonary:     Effort: Pulmonary effort is normal.  Musculoskeletal:     Cervical back: Normal range of motion and neck supple.  Skin:    General: Skin is warm and dry.  Neurological:     Mental Status: She is alert and oriented to person, place, and time.  Psychiatric:     Comments: Anxious appearing    ED Results / Procedures / Treatments   Labs (all labs ordered are listed, but only abnormal results are displayed) Labs Reviewed  BASIC METABOLIC PANEL - Abnormal; Notable for the following components:      Result Value   Glucose, Bld 132 (*)    All other components within normal limits  CBC    EKG EKG Interpretation  Date/Time:  Monday June 17 2021 18:54:07 EST Ventricular Rate:  69 PR Interval:  208 QRS Duration: 80 QT Interval:  402 QTC Calculation: 431 R Axis:   20 Text Interpretation: Sinus arrhythmia RSR' in V1 or V2, right VCD or RVH Confirmed by Varney Biles [phone]) on 06/17/2021 8:30:58 PM  Radiology No results found.  Procedures Procedures    Medications Ordered in ED Medications  metoCLOPramide (REGLAN) injection 10 mg (10 mg Intravenous Given 06/17/21 1855)  ketorolac (TORADOL) 15 MG/ML injection 15 mg (15 mg Intravenous Given 06/17/21 1855)  midazolam (VERSED) injection 1 mg (1 mg Intravenous Given 06/17/21 1854)    ED Course/ Medical Decision Making/ A&P                           Medical Decision Making Amount and/or Complexity of Data Reviewed Labs: ordered. Radiology: ordered.  Risk OTC drugs. Prescription drug management.   This patient presents to the ED with chief complaint(s) of chest pain, palpitations, shortness of  breath and associated headache with pertinent past medical history of hypertension, mitral valve prolapse which further complicates the presenting complaint. The complaint involves an extensive differential diagnosis and treatment options and also carries with it a high risk of complications and morbidity.    The differential diagnosis includes : PSVT, A-fib, CHF, anxiety disorder, ACS, PACs/PVCs, electrolyte abnormality.  Patient has no focal neurodeficits, no neck pain, no meningismus and the headaches are not thunderclap in nature.  Our suspicion for subarachnoid hemorrhage, vertebral dissection is quite low.  The initial plan is to get basic labs.  Patient has been placed on cardiac monitoring, as there is concerns for arrhythmia   Additional history obtained: Records reviewed  previous cardiology visit documents  Reassessment:  Upon review of patient's rhythm strip, it appears that she has had A-fib.  When she arrived, it appeared that she had A-fib.  She is feeling a lot better now, but has had sinus rhythm for prolonged period of time.  She has history of MVP, which does put her at risk for A-fib.  Her [place] Vasc score is 1 -we will advised that she takes aspirin and follow-up with cardiology clinic.    We considered admission to the hospital, but she is rate controlled.  She was observed in the ED for prolonged period and there was no repeat sustained A-fib with RVR.  I think it is safe to discharge her with close outpatient follow-up and with strict ER return precautions.  Patient is also agreeable with this plan.     Final Clinical Impression(s) / ED Diagnoses Final diagnoses:  Atrial fibrillation, unspecified type [facility])    Rx / DC Orders ED Discharge Orders          Ordered    aspirin 81 MG chewable tablet  Daily        06/17/21 2126              Varney Biles, MD 06/22/21 [phone]

## 2021-06-24 ENCOUNTER — Ambulatory Visit: Payer: BC Managed Care – PPO | Admitting: Internal Medicine

## 2021-06-26 ENCOUNTER — Ambulatory Visit: Payer: BC Managed Care – PPO

## 2021-06-27 ENCOUNTER — Encounter: Payer: Self-pay | Admitting: Internal Medicine

## 2021-06-27 ENCOUNTER — Ambulatory Visit: Payer: BC Managed Care – PPO | Admitting: Internal Medicine

## 2021-06-27 ENCOUNTER — Other Ambulatory Visit: Payer: Self-pay

## 2021-06-27 VITALS — BP 124/74 | HR 42 | Resp 18 | Ht 63.0 in | Wt 157.6 lb

## 2021-06-27 DIAGNOSIS — I10 Essential (primary) hypertension: Secondary | ICD-10-CM | POA: Diagnosis not present

## 2021-06-27 DIAGNOSIS — R200 Anesthesia of skin: Secondary | ICD-10-CM | POA: Diagnosis not present

## 2021-06-27 DIAGNOSIS — R7989 Other specified abnormal findings of blood chemistry: Secondary | ICD-10-CM | POA: Diagnosis not present

## 2021-06-27 DIAGNOSIS — J0110 Acute frontal sinusitis, unspecified: Secondary | ICD-10-CM

## 2021-06-27 DIAGNOSIS — G4452 New daily persistent headache (NDPH): Secondary | ICD-10-CM

## 2021-06-27 DIAGNOSIS — R519 Headache, unspecified: Secondary | ICD-10-CM | POA: Insufficient documentation

## 2021-06-27 DIAGNOSIS — J309 Allergic rhinitis, unspecified: Secondary | ICD-10-CM

## 2021-06-27 DIAGNOSIS — I491 Atrial premature depolarization: Secondary | ICD-10-CM

## 2021-06-27 LAB — TSH: TSH: 2.71 u[IU]/mL (ref 0.35–5.50)

## 2021-06-27 LAB — T4, FREE: Free T4: 0.73 ng/dL (ref 0.60–1.60)

## 2021-06-27 LAB — VITAMIN B12: Vitamin B-12: 859 pg/mL (ref 211–911)

## 2021-06-27 MED ORDER — LEVOCETIRIZINE DIHYDROCHLORIDE 5 MG PO TABS: ORAL_TABLET | ORAL | 3 refills | Status: DC

## 2021-06-27 MED ORDER — AMOXICILLIN-POT CLAVULANATE 875-125 MG PO TABS: 1.0000 | ORAL_TABLET | Freq: Two times a day (BID) | ORAL | 0 refills | Status: DC

## 2021-06-27 NOTE — Patient Instructions (Addendum)
We will check the vitamin and thyroid levels.  ? ?We will treat you for a sinus infection with 10 day course of antibiotics augmentin 1 pill twice a day.  ?

## 2021-06-27 NOTE — Assessment & Plan Note (Signed)
Likely elevated BP causing headaches 1-2 weeks ago. BP at goal today back on her BP medications.  ?

## 2021-06-27 NOTE — Assessment & Plan Note (Signed)
TSH recently elevated with cardiology without further assessment. Checking TSH and free T4 today to assess thyroid function. We discussed that this could indicate mildly low thyroid levels which would not contribute typically to palpitations. ?

## 2021-06-27 NOTE — Assessment & Plan Note (Signed)
Likely related to high BP. Since resumption of medication she has not had recurrence. She does have sinus infection which could also contribute so treating for this today.  ?

## 2021-06-27 NOTE — Progress Notes (Signed)
? ?  Subjective:  ? ?Patient ID: Grace George, female    DOB: Sep 28, 1960, 61 y.o.   MRN: [id number] ? ?Headache  ?Associated symptoms include numbness, rhinorrhea and sinus pressure. Pertinent negatives include no abdominal pain, coughing, ear pain, fever, nausea, sore throat, tinnitus or vomiting.  ?The patient is a 61 YO female coming in for concerns. ? ?Review of Systems  ?Constitutional: Negative.  Negative for fatigue, fever and unexpected weight change.  ?HENT:  Positive for congestion, postnasal drip, rhinorrhea, sinus pressure and sinus pain. Negative for ear discharge, ear pain, sneezing, sore throat, tinnitus, trouble swallowing and voice change.   ?Eyes: Negative.   ?Respiratory:  Negative for cough, chest tightness, shortness of breath and wheezing.   ?Cardiovascular:  Positive for palpitations. Negative for chest pain and leg swelling.  ?Gastrointestinal: Negative.  Negative for abdominal distention, abdominal pain, constipation, diarrhea, nausea and vomiting.  ?Musculoskeletal: Negative.   ?Skin: Negative.   ?Neurological:  Positive for numbness and headaches.  ?Psychiatric/Behavioral: Negative.    ? ?Objective:  ?Physical Exam ?Constitutional:   ?   Appearance: She is well-developed.  ?HENT:  ?   Head: Normocephalic and atraumatic.  ?   Comments: Oropharynx with redness and clear drainage, nose with swollen turbinates, TMs normal bilaterally.  ?Neck:  ?   Thyroid: No thyromegaly.  ?Cardiovascular:  ?   Rate and Rhythm: Normal rate. Rhythm irregular.  ?   Comments: Frequent irregular beats ?Pulmonary:  ?   Effort: Pulmonary effort is normal. No respiratory distress.  ?   Breath sounds: Normal breath sounds. No wheezing or rales.  ?Abdominal:  ?   General: Bowel sounds are normal. There is no distension.  ?   Palpations: Abdomen is soft.  ?   Tenderness: There is no abdominal tenderness. There is no rebound.  ?Musculoskeletal:     ?   General: Tenderness present.  ?   Cervical back: Normal range of  motion.  ?Lymphadenopathy:  ?   Cervical: No cervical adenopathy.  ?Skin: ?   General: Skin is warm and dry.  ?Neurological:  ?   Mental Status: She is alert and oriented to person, place, and time.  ?   Coordination: Coordination normal.  ? ? ?Vitals:  ? 06/27/21 1030  ?BP: 124/74  ?Pulse: (!) 42  ?Resp: 18  ?SpO2: 97%  ?Weight: 157 lb 9.6 oz (71.5 kg)  ?Height: 5\' 3"  (1.6 m)  ? ? ?This visit occurred during the SARS-CoV-2 public health emergency.  Safety protocols were in place, including screening questions prior to the visit, additional usage of staff PPE, and extensive cleaning of exam room while observing appropriate contact time as indicated for disinfecting solutions.  ? ?Assessment & Plan:  ? ?

## 2021-06-27 NOTE — Assessment & Plan Note (Signed)
Frequent irregular beats on exam. She just started propranolol 10 mg BID (confusing cardiology note as this states continue metoprolol but they did D/C this and start propranolol). She has stopped metoprolol. Given she is on this about 1 week HR is a little low today 42 but likely underestimated due to the irregular beats. Sounded normal on exam. Continue propranolol. ?

## 2021-06-27 NOTE — Assessment & Plan Note (Signed)
Rx augmentin 10 day course for likely sinus infection with 2-3 weeks of symptoms and tenderness to sinuses on exam. ?

## 2021-06-27 NOTE — Assessment & Plan Note (Signed)
Recent elevated TSH so checking TSH and free T4 to assess thyroid function. Previous low B12 so checking B12 today.  ?

## 2021-07-01 ENCOUNTER — Encounter: Payer: Self-pay | Admitting: Internal Medicine

## 2021-07-01 DIAGNOSIS — G4452 New daily persistent headache (NDPH): Secondary | ICD-10-CM

## 2021-07-01 DIAGNOSIS — R200 Anesthesia of skin: Secondary | ICD-10-CM

## 2021-07-03 ENCOUNTER — Encounter: Payer: Self-pay | Admitting: Neurology

## 2021-07-22 NOTE — Progress Notes (Signed)
? ?Cardiology Office Note:   ? ?Date:  07/22/2021  ? ?ID:  Grace George, DOB [birth year], MRN [id number] ? ?PCP:  Hoyt Koch, MD ?  ?[facility] Providers ?Cardiologist:  Shelva Majestic, MD  ?   ? ?Referring MD: Hoyt Koch, *  ? ?Follow-up for atrial fibrillation and essential hypertension. ? ?History of Present Illness:   ? ?Grace George is a 61 y.o. female with a hx of HLD, HTN, and palpitations.  She had a echocardiogram in 2009 which showed normal systolic and diastolic function.  She was also noted to have borderline mitral valve prolapse.  She was started on atenolol 50 mg daily for palpitations and was also taking simvastatin. ? ?She followed up with Dr. [name] 2022 and continues to be stable from a cardiac standpoint.  She did note some nonexertional left arm discomfort and reported elevated blood pressure.  She presented to her PCP and her blood pressure was found to be 160/84.  Losartan 50 mg daily was added to her atenolol antihypertensive medication regimen.  She took losartan for approximately 1 week and reported blood pressures in 287-867 systolic range.  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 ? ?She was seen in follow-up by Dr. [name] on 05/09/2021.  During that time she reported that she had broken her kneecap in early 2022.  She had not been working.  She was no longer taking atenolol or losartan.  She reported that her blood pressure had been low.  She denied chest discomfort.  She continued to have periods of palpitations.  She reported compliance with her simvastatin and pantoprazole.  She had also been prescribed alprazolam as needed for anxiety.  Her blood pressure was 140/80.  She was started on metoprolol succinate 12.5 mg daily and follow-up was planned for 6 months. ? ?She presented to the emergency department 06/17/2021.  Her blood pressure at that time was noted to be 170/108.  She reported that her blood pressure has been elevated for 2 days and she  had an associated headache.  She was contacted by the nursing triage line on 06/18/2021 and reported that her blood pressure was 118/71. ? ?She presented to the clinic 06/20/2021 for follow-up evaluation stated she was recently in the emergency department with elevated blood pressure and anxiety.  Her EKG at that time showed atrial fibrillation and  on repeat showed sinus rhythm with arrhythmia.  Her EKG 06/20/2021 showed sinus tachycardia with premature atrial complexes 105 bpm.  We reviewed her previous EKGs and it did not appear that she had any atrial fibrillation.  She expressed understanding.  She had been very anxious about her neuropathy, palpitations, and her general health.  She noted that her pulse was in the 50s with Toprol tartrate and she discontinued the medication.  We used shared decision-making to discuss options for treatment.  I  placed her on propranolol 10 mg twice daily, asked her to check her blood pressure and pulse 3-4 times per week.  I  asked her to manually check her pulse for accuracy.  I  also gave her the mindfulness stress reduction sheet and planned follow-up for 1 month. ? ?She presents to the clinic today for follow-up evaluation states she feels well.  She feels that the propranolol medication has helped with her palpitations.  She reports that her blood pressures in the morning are around 117/63.  She does report some pulse is in the 40-50 range.  She denies lightheadedness, presyncope  or syncope with these heart rates.  Today in the clinic her blood pressure was initially 176/82.  I feel that there is some aspect of whitecoat hypertension here.  On recheck it was 138/78.  She continues to walk 30 minutes daily.  I will have her continue her blood pressure log and check her blood pressure 2 times per week.  We will plan follow-up in around 4 months with Dr. [name]. ? ?Today she denies chest pain, shortness of breath, lower extremity edema, fatigue, palpitations, melena, hematuria,  hemoptysis, diaphoresis, weakness, presyncope, syncope, orthopnea, and PND. ? ? ? ?Past Medical History:  ?Diagnosis Date  ? Abnormal Pap smear   ? Allergic rhinitis   ? Allergy   ? Anemia   ? Anxiety   ? GERD (gastroesophageal reflux disease)   ? History of shingles 1/15  ? History of stress test 06/2011  ? Abnormal Myocadial perfusion scan demomstrating an attenuation defect in the anterior region of the myocardium, No ischemia or infaract/scar is seenin the remaining myocardium. Excercise capacity 12 METS.  ? HTN (hypertension)   ? Hx of echocardiogram 2009  ? Showed normal systolic and diastolic function. she does have borderline mitral valve prolapse.  ? Hyperlipidemia   ? per pt NO  ? Lumbar back pain   ? Migraines   ? MVP (mitral valve prolapse)   ? 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 Post-operative nausea and vomiting   ? ? ?Past Surgical History:  ?Procedure Laterality Date  ? CESAREAN SECTION    ? twice  ? COLONOSCOPY    ? CRYOABLATION    ? times 2 for abn paps  ? ESOPHAGOGASTRODUODENOSCOPY    ? HYSTEROSCOPY    ? LIGAMENT REPAIR Right 05/24/2020  ? Procedure: COLLATERAL LIGAMENT REPAIR BOTH SIDES;  Surgeon: Hiram Gash, MD;  Location: [facility];  Service: Orthopedics;  Laterality: Right;  ? PATELLAR TENDON REPAIR Right 05/24/2020  ? Procedure: PATELLA TENDON REPAIR;  Surgeon: Hiram Gash, MD;  Location: [facility];  Service: Orthopedics;  Laterality: Right;  ? PATELLECTOMY Right 05/24/2020  ? Procedure: PATELLECTOMY;  Surgeon: Hiram Gash, MD;  Location: [facility];  Service: Orthopedics;  Laterality: Right;  ? WRIST SURGERY    ? right  ? ? ?Current Medications: ?No outpatient medications have been marked as taking for the 07/24/21 encounter (Appointment) with [name], NP.  ?  ? ?Allergies:   Eggs or egg-derived products and Sulfa antibiotics  ? ?Social History  ? ?Socioeconomic History  ? Marital status: Married  ?   Spouse name: Not on file  ? Number of children: 2  ? Years of education: Not on file  ? Highest education level: Not on file  ?Occupational History  ?  Employer: [employer]  ?Tobacco Use  ? Smoking status: Never  ? Smokeless tobacco: Never  ?Substance and Sexual Activity  ? Alcohol use: No  ?  Alcohol/week: 0.0 standard drinks  ? Drug use: No  ? Sexual activity: Not Currently  ?  Partners: Male  ?  Birth control/protection: Post-menopausal  ?Other Topics Concern  ? Not on file  ?Social History Narrative  ? Married, 2 kids  ? Employed - [employer] - in [place]  ? Never tobacco, drugs  ? No EtOH  ? ?Social Determinants of Health  ? ?Financial Resource Strain: Not on file  ?Food Insecurity: Not on file  ?  Transportation Needs: Not on file  ?Physical Activity: Not on file  ?Stress: Not on file  ?Social Connections: Not on file  ?  ? ?Family History: ?The patient's family history includes Breast cancer in her mother; Diabetes in her mother and sister; Heart disease in her brother and mother; Hypertension in her mother and sister; Other in her brother; Prostate cancer in her father. ? ?ROS:   ?Please see the history of present illness.    ? All other systems reviewed and are negative. ? ? ?Risk Assessment/Calculations:   ?  ? ?    ? ?Physical Exam:   ? ?VS:  LMP 03/28/2010    ? ?Wt Readings from Last 3 Encounters:  ?06/27/21 157 lb 9.6 oz (71.5 kg)  ?06/20/21 160 lb 9.6 oz (72.8 kg)  ?06/17/21 158 lb (71.7 kg)  ?  ? ?GEN:  Well nourished, well developed in no acute distress ?HEENT: Normal ?NECK: No JVD; No carotid bruits ?LYMPHATICS: No lymphadenopathy ?CARDIAC: RRR, no murmurs, rubs, gallops ?RESPIRATORY:  Clear to auscultation without rales, wheezing or rhonchi  ?ABDOMEN: Soft, non-tender, non-distended ?MUSCULOSKELETAL:  No edema; No deformity  ?SKIN: Warm and dry ?NEUROLOGIC:  Alert and oriented x 3 ?PSYCHIATRIC:  Normal affect  ? ? ?EKGs/Labs/Other Studies Reviewed:   ? ?The following studies  were reviewed today: ? ?Echocardiogram 10/03/2015 ? ?Study Conclusions  ? ?- Left ventricle: The cavity size was normal. Wall thickness was  ?  normal. The estimated ejection fraction was 55%. Wall moti

## 2021-07-24 ENCOUNTER — Other Ambulatory Visit: Payer: Self-pay

## 2021-07-24 ENCOUNTER — Ambulatory Visit (HOSPITAL_BASED_OUTPATIENT_CLINIC_OR_DEPARTMENT_OTHER): Payer: BC Managed Care – PPO | Admitting: General Practice

## 2021-07-24 ENCOUNTER — Encounter (HOSPITAL_BASED_OUTPATIENT_CLINIC_OR_DEPARTMENT_OTHER): Payer: Self-pay | Admitting: General Practice

## 2021-07-24 VITALS — BP 138/78 | HR 69 | Ht 63.0 in | Wt 160.0 lb

## 2021-07-24 DIAGNOSIS — E785 Hyperlipidemia, unspecified: Secondary | ICD-10-CM | POA: Diagnosis not present

## 2021-07-24 DIAGNOSIS — R002 Palpitations: Secondary | ICD-10-CM | POA: Diagnosis not present

## 2021-07-24 DIAGNOSIS — I10 Essential (primary) hypertension: Secondary | ICD-10-CM

## 2021-07-24 DIAGNOSIS — F419 Anxiety disorder, unspecified: Secondary | ICD-10-CM

## 2021-07-24 NOTE — Patient Instructions (Signed)
Medication Instructions:  ?Your Physician recommend you continue on your current medication as directed.   ? ?*If you need a refill on your cardiac medications before your next appointment, please call your pharmacy* ? ? ?Follow-Up: ?At [facility], you and your health needs are our priority.  As part of our continuing mission to provide you with exceptional heart care, we have created designated Provider Care Teams.  These Care Teams include your primary Cardiologist (physician) and Advanced Practice Providers (APPs -  Physician Assistants and Nurse Practitioners) who all work together to provide you with the care you need, when you need it. ? ?We recommend signing up for the patient portal called "MyChart".  Sign up information is provided on this After Visit Summary.  MyChart is used to connect with patients for Virtual Visits (Telemedicine).  Patients are able to view lab/test results, encounter notes, upcoming appointments, etc.  Non-urgent messages can be sent to your provider as well.   ?To learn more about what you can do with MyChart, go to [email or web address].   ? ?Your next appointment:   ?4-6 month(s) ? ?The format for your next appointment:   ?In Person ? ?Provider:   ?Shelva Majestic, MD  or Coletta Memos, NP { ? ?Other Instructions ?Exercise recommendations: ?The American Heart Association recommends 150 minutes of moderate intensity exercise weekly. ?Try 30 minutes of moderate intensity exercise 4-5 times per week. ?This could include walking, jogging, or swimming. ? ? ? ? ?

## 2021-08-21 ENCOUNTER — Encounter: Payer: Self-pay | Admitting: Internal Medicine

## 2021-10-18 ENCOUNTER — Ambulatory Visit: Payer: BC Managed Care – PPO | Admitting: Neurology

## 2021-10-23 ENCOUNTER — Ambulatory Visit: Payer: BC Managed Care – PPO | Admitting: Cardiovascular Disease

## 2021-10-23 ENCOUNTER — Encounter: Payer: Self-pay | Admitting: Cardiovascular Disease

## 2021-10-23 DIAGNOSIS — R002 Palpitations: Secondary | ICD-10-CM

## 2021-10-23 DIAGNOSIS — I10 Essential (primary) hypertension: Secondary | ICD-10-CM | POA: Diagnosis not present

## 2021-10-23 DIAGNOSIS — K219 Gastro-esophageal reflux disease without esophagitis: Secondary | ICD-10-CM

## 2021-10-23 DIAGNOSIS — F419 Anxiety disorder, unspecified: Secondary | ICD-10-CM

## 2021-10-23 DIAGNOSIS — E7800 Pure hypercholesterolemia, unspecified: Secondary | ICD-10-CM

## 2021-10-23 NOTE — Patient Instructions (Addendum)
Medication Instructions:  Your Physician recommend you continue on your current medication as directed.    -You may take an extra 10 mg of propanolol for palpations   Please monitor blood pressure daily -If systolic (top number) is consistently greater than 140, please increase propanolol to 20 mg in the morning and 10 mg at night.    *If you need a refill on your cardiac medications before your next appointment, please call your pharmacy*   Lab Work: None ordered today   Testing/Procedures: None ordered today   Follow-Up: At [facility], you and your health needs are our priority.  As part of our continuing mission to provide you with exceptional heart care, we have created designated Provider Care Teams.  These Care Teams include your primary Cardiologist (physician) and Advanced Practice Providers (APPs -  Physician Assistants and Nurse Practitioners) who all work together to provide you with the care you need, when you need it.  We recommend signing up for the patient portal called "MyChart".  Sign up information is provided on this After Visit Summary.  MyChart is used to connect with patients for Virtual Visits (Telemedicine).  Patients are able to view lab/test results, encounter notes, upcoming appointments, etc.  Non-urgent messages can be sent to your provider as well.   To learn more about what you can do with MyChart, go to [email or web address].    Your next appointment:   1 year(s)  The format for your next appointment:   In Person  Provider:   Shelva Majestic, MD

## 2021-10-23 NOTE — Progress Notes (Signed)
Patient ID: Grace George, female   DOB: 25-Nov-1960, 61 y.o.   MRN: [id number]     HPI: Grace George is a 61 y.o. female who presents to the office for a 5 month follow-up cardiology evaluation.  Grace George has a history of hypertension, hyperlipidemia, and palpitations and has been treated with beta blocker therapy.  An echo Doppler study in 2009, showed normal systolic and diastolic function and she did have borderline mitral valve prolapse.  She has been on atenolol 50 mg daily for her palpitations.  She also has been on simvastatin 40 mg for hyperlipidemia.  Laboratory on 08/02/2014 revealed a hemoglobin of 13.1, hematocrit 39.9.  A competent metabolic panel was entirely normal.  She had normal renal function and LFTs.  Lipid studies were excellent with a total cholesterol 133, triglycerides 60, HDL 60, and LDL 61.  TSH was normal.  In 2017 she went to the emergency room complaints of shortness of breath.  She denied any exertional symptoms.  Previously, she had been walking on a treadmill for at least 35-40 minutes without discomfort. It was felt that possibly her symptoms were due to allergies.  Since I last saw her, she underwent an echo Doppler study on 10/03/2015.  This revealed normal LV function, infection fraction of 55% without regional wall motion and her maladies.  There was grade 2 diastolic dysfunction.  There was flattening mitral valve closure without frank prolapse and trivial TR.  She had normal pulmonary pressures.  I saw her in May 2018, she has continued to do well from a cardiac standpoint.  Typically her blood pressures have ranged in the 361 range systolically.  She has been maintained on a atenolol 50 mg daily with control of palpitations.  She is on simvastatin 40 mg for hyperlipidemia.  She was recently started on Linzess for chronic constipation but does not seem to have significant benefit from this but was just initiated.  She was seen by her primary physician earlier  in the year with some situational depression.  Her husband was diagnosed with cancer.   I saw her in October 2020.  At that time she was remaining stable from a cardiovascular standpoint.  She was having at times some episodes of anxiety which resulted in stress mediated palpitations.  She denied any chest tightness, PND, orthopnea.  She had undergone laboratory in August 2020.  At that time LDL cholesterol was 54 dose of simvastatin.Hemoglobin A1c was stable at 5.5.  Her vitamin D had increased to 40.28.  Thyroid function studies, chemistry and CBC studies were stable.    Over the past year, Grace George has remained stable.  However recently she began to notice some nonexertional left arm discomfort and also blood pressure elevation.  She saw her primary physician Dr. Pricilla Holm on April 05, 2020.  At that time her blood pressure was elevated at 160/84 and losartan 50 mg daily was added to her atenolol 50 mg daily regimen.  She has been on losartan now for 1 week and has noticed blood pressure improvement when she takes her blood pressure at home with typical blood pressures ranging from 224-497 systolically.  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  She continues to be on simvastatin 40 mg for hyperlipidemia and pantoprazole for GERD.  She denies any chest tightness or pressure.  Her arm discomfort has resolved.    I last saw her on May 09, 2021.  Since I  last saw her, she broke her kneecap in early 2022 and has not been working.  She was  no longer taking atenolol and apparently her blood pressure was low and she is no longer taking losartan.  She denies any recent chest pain.  She continues to note episodic palpitations.  She has been on pantoprazole 40 mg as needed and continues to be on simvastatin 40 mg daily for hyperlipidemia.  She has a prescription for alprazolam to take as needed for anxiety.  During that evaluation, she was started on  metoprolol succinate 12.5 mg daily.  She presented to the emergency room on June 17, 2021 with blood pressure elevation and reportedly an initial ECG may have suggested atrial fibrillation.  She saw Coletta Memos in follow-up on June 20, 2021 and her ECG showed sinus tachycardia with PACs.  She was not felt to have had atrial fibrillation.  Apparently she had discontinued her low-dose metoprolol with her pulse in the 50s.  At that time she was placed on propranolol 10 mg twice a day by Coletta Memos.  Since being on propranolol, she is unaware of recurrent palpitations.  She states her blood pressure at home has been stable typically runs around 105/65.  She denies chest pain or shortness of breath.  She presents for follow-up evaluation.  Past Medical History:  Diagnosis Date   Abnormal Pap smear    Allergic rhinitis    Allergy    Anemia    Anxiety    GERD (gastroesophageal reflux disease)    History of shingles 1/15   History of stress test 06/2011   Abnormal Myocadial perfusion scan demomstrating an attenuation defect in the anterior region of the myocardium, No ischemia or infaract/scar is seenin the remaining myocardium. Excercise capacity 12 METS.   HTN (hypertension)    Hx of echocardiogram 2009   Showed normal systolic and diastolic function. she does have borderline mitral valve prolapse.   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   Post-operative nausea and vomiting     Past Surgical History:  Procedure Laterality Date   CESAREAN SECTION     twice   COLONOSCOPY     CRYOABLATION     times 2 for abn paps   ESOPHAGOGASTRODUODENOSCOPY     HYSTEROSCOPY     LIGAMENT REPAIR Right 05/24/2020   Procedure: COLLATERAL LIGAMENT REPAIR BOTH SIDES;  Surgeon: Hiram Gash, MD;  Location: [facility];  Service: Orthopedics;  Laterality: Right;    PATELLAR TENDON REPAIR Right 05/24/2020   Procedure: PATELLA TENDON REPAIR;  Surgeon: Hiram Gash, MD;  Location: [facility];  Service: Orthopedics;  Laterality: Right;   PATELLECTOMY Right 05/24/2020   Procedure: PATELLECTOMY;  Surgeon: Hiram Gash, MD;  Location: [facility];  Service: Orthopedics;  Laterality: Right;   WRIST SURGERY     right    Allergies  Allergen Reactions   Eggs Or Egg-Derived Products     Causes a rash, if she eats a lot!   Sulfa Antibiotics     Rash & itching    Current Outpatient Medications  Medication Sig Dispense Refill   ALPRAZolam (XANAX) 0.5 MG tablet TAKE 1 TABLET (0.5 MG TOTAL) BY MOUTH 2 (TWO) TIMES DAILY AS NEEDED FOR ANXIETY. 60 tablet 1   calcium carbonate (OS-CAL) 1250 (  500 Ca) MG chewable tablet Chew 1 tablet by mouth daily.     levocetirizine (XYZAL) 5 MG tablet TAKE 1 TABLET BY MOUTH EVERY DAY IN THE EVENING 90 tablet 3   pantoprazole (PROTONIX) 40 MG tablet Take 1 tablet (40 mg total) by mouth as needed. 90 tablet 3   propranolol (INDERAL) 10 MG tablet Take 1 tablet (10 mg total) by mouth 2 (two) times daily. 180 tablet 3   simvastatin (ZOCOR) 40 MG tablet Take 1 tablet (40 mg total) by mouth daily at 6 PM. 30 tablet 6   No current facility-administered medications for this visit.    Social History   Socioeconomic History   Marital status: Married    Spouse name: Not on file   Number of children: 2   Years of education: Not on file   Highest education level: Not on file  Occupational History    Employer: [employer]  Tobacco Use   Smoking status: Never   Smokeless tobacco: Never  Substance and Sexual Activity   Alcohol use: No    Alcohol/week: 0.0 standard drinks of alcohol   Drug use: No   Sexual activity: Not Currently    Partners: Male    Birth control/protection: Post-menopausal  Other Topics Concern   Not on file  Social History Narrative   Married, 2 kids   Employed - [employer] [place] in [place]   Never tobacco, drugs   No EtOH   Social Determinants of Radio broadcast assistant Strain: Not on file  Food Insecurity: Not on file  Transportation Needs: Not on file  Physical Activity: Not on file  Stress: Not on file  Social Connections: Not on file  Intimate Partner Violence: Not on file   Updated social history:  she is married and has 2 children, currently age 28 and 25.  She has 2 grandchildren who are twins.  There is no tobacco or alcohol use.  Of note, her brother underwent cardiac transplantation .   Family History  Problem Relation Age of Onset   Breast cancer Mother    Heart disease Mother    Diabetes Mother    Hypertension Mother    Prostate cancer Father    Diabetes Sister    Hypertension Sister    Heart disease Brother    Other Brother        CHF, had heart transplant    ROS General: Negative; No fevers, chills, or night sweats;  HEENT: Negative; No changes in vision or hearing, sinus congestion, difficulty swallowing Pulmonary:  mild shortness of breath, felt to be due to allergies Cardiovascular: Stress mediated palpitations, no chest pain or exertional dyspnea GI: History of GERD. GU: Negative; No dysuria, hematuria, or difficulty voiding Immunologic: Seasonal allergies Musculoskeletal: Negative; no myalgias, joint pain, or weakness Hematologic/Oncology: Negative; no easy bruising, bleeding Endocrine: Negative; no heat/cold intolerance; no diabetes Neuro: Occasional foot numbness Skin: Negative; No rashes or skin lesions Psychiatric: Negative; No behavioral problems, depression Sleep: Negative; No snoring, daytime sleepiness, hypersomnolence, bruxism, restless legs, hypnogognic hallucinations, no cataplexy Other comprehensive 14 point system review is negative.   PE BP (!) 142/72   Pulse 70   Ht '5\' 3"'  (1.6 m)   Wt 160 lb (72.6 kg)   LMP 03/28/2010   SpO2 99%   BMI 28.34 kg/m    Repeat blood pressure by me  was 135/70  Wt Readings from Last 3 Encounters:  10/23/21 160 lb (72.6 kg)  07/24/21 160 lb (72.6 kg)  06/27/21 157 lb 9.6 oz (71.5 kg)    General: Alert, oriented, no distress.  Skin: normal turgor, no rashes, warm and dry HEENT: Normocephalic, atraumatic. Pupils equal round and reactive to light; sclera anicteric; extraocular muscles intact; Nose without nasal septal hypertrophy Mouth/Parynx benign; Mallinpatti scale 3 Neck: No JVD, no carotid bruits; normal carotid upstroke Lungs: clear to ausculatation and percussion; no wheezing or rales Chest wall: without tenderness to palpitation Heart: PMI not displaced, RR with occasional ectopy for which the patient was unaware during my auscultation, s1 s2 normal, 1/6 systolic murmur, no diastolic murmur, no rubs, gallops, thrills, or heaves Abdomen: soft, nontender; no hepatosplenomehaly, BS+; abdominal aorta nontender and not dilated by palpation. Back: no CVA tenderness Pulses 2+ Musculoskeletal: full range of motion, normal strength, no joint deformities Extremities: no clubbing cyanosis or edema, Homan's sign negative  Neurologic: grossly nonfocal; Cranial nerves grossly wnl Psychologic: Normal mood and affect  October 23, 2021 ECG (independently read by me): Normal sinus rhythm at 70 bpm.  Incomplete right bundle branch block.  No ectopy.  Normal intervals  May 09, 2021 ECG (independently read by me): Sinus rhythm at 89, sinus arrythmia  April 12, 2020 ECG (independently read by me): Normal sinus rhythm at 60 bpm.  Mild first-degree AV block with a PR interval of 210 ms.  No ectopy.  October 2020 ECG (independently read by me): Sinus Bradycardia at 53, PAV First degree AV block; PR 222 ms  June 2019 ECG (independently read by me): Sinus bradycardia 52 bpm.  Nonspecific T change.  Normal intervals.  Borderline first-degree AV block with a PR interval of 204 ms.  May 2018 ECG (independently read by me): Sinus bradycardia 54  bpm..  Borderline First-degree AV block with a PR interval of 202 ms.  May 2017 ECG (independently read by me): Sinus bradycardia 55 bpm.  First degree AV block with a PR interval of 204 ms.  April 2016 ECG (independently read by me): Normal sinus rhythm.  Mild RV conduction delay with T-wave inversion V1 and V2, isolated PAC.  Borderline first-degree AV block with a PR interval at 22 ms.  April 2015 ECG (independently read by me): Sinus rhythm at 72 beats per minute with mild RV conduction delay.  She is previously noted T-wave inversion in leads V1 through V3.  She also has occasional PACs with different morphology to the P wave in these premature complexes.  QTc interval 47 ms.  PR interval 188 ms.  LABS:     Latest Ref Rng & Units 06/17/2021    5:42 PM 05/06/2021    9:54 AM 08/28/2020    8:50 AM  BMP  Glucose 70 - 99 mg/dL 132  99  97   BUN 8 - 23 mg/dL '10  9  12   ' Creatinine 0.44 - 1.00 mg/dL 0.60  0.75  0.73   BUN/Creat Ratio 12 - 28  12    Sodium 135 - 145 mmol/L 140  142  139   Potassium 3.5 - 5.1 mmol/L 3.5  4.8  4.5   Chloride 98 - 111 mmol/L 106  104  104   CO2 22 - 32 mmol/L '23  24  28   ' Calcium 8.9 - 10.3 mg/dL 10.2  10.3  10.0         Latest Ref Rng & Units 05/06/2021    9:54 AM 08/28/2020    8:50 AM 04/12/2020    8:43 AM  Hepatic  Function  Total Protein 6.0 - 8.5 g/dL 7.2  7.5  6.9   Albumin 3.8 - 4.8 g/dL 4.7  4.6  4.6   AST 0 - 40 IU/L '30  24  24   ' ALT 0 - 32 IU/L '24  24  22   ' Alk Phosphatase 44 - 121 IU/L 111  93  106   Total Bilirubin 0.0 - 1.2 mg/dL 0.6  0.5  0.4         Latest Ref Rng & Units 06/17/2021    5:42 PM 05/06/2021    9:54 AM 04/12/2020    8:43 AM  CBC  WBC 4.0 - 10.5 K/uL 5.7  4.7  4.6   Hemoglobin 12.0 - 15.0 g/dL 13.7  13.9  13.4   Hematocrit 36.0 - 46.0 % 41.4  42.5  40.6   Platelets 150 - 400 K/uL 180  187  152    Lab Results  Component Value Date   TSH 2.71 06/27/2021    BNP No results found for: "PROBNP"  Lipid Panel      Component Value Date/Time   CHOL 163 05/06/2021 0954   TRIG 54 05/06/2021 0954   HDL 83 05/06/2021 0954   CHOLHDL 2.0 05/06/2021 0954   CHOLHDL 2 12/07/2018 0909   VLDL 10.4 12/07/2018 0909   LDLCALC 69 05/06/2021 0954     RADIOLOGY: No results found.  IMPRESSION:  1. Essential hypertension   2. Palpitations   3. Anxiety   4. Gastroesophageal reflux disease without esophagitis   5. Pure hypercholesterolemia     ASSESSMENT AND PLAN: Ms. Ekaterina Denise is a very pleasant 61 year-old female who has a history of hypertension, hyperlipidemia, and palpitations.  In the past, her palpitations had stabilized on atenolol.  Palpitations often were anxiety mediated.  Remotely she had been on a regimen of losartan and atenolol for blood pressure control.  Apparently she began to notice low blood pressure with associated dizziness.  As result as I last saw her, she has been taken off both of her medications.  Blood pressure did increase and she had resolution of her prior dizziness.  When last seen by me in January 2023, it was recommended she try low-dose metoprolol 12.5 mg.  Apparently it was subsequently discontinued.  I reviewed her recent evaluations since her January office visit.  She is now on a regimen of propranolol 10 mg twice daily and believes this is controlling her palpitations.  Her blood pressure at home is 105/65.  Her blood pressure on repeat in the office today was slightly increased with initial systolic pressure 627 and repeat pressure by me 135/70.  She did have ectopy noted on auscultation which she was unaware of.  Presently she will continue her current propranolol 10 twice daily regimen.  However, I have recommended that she monitor her blood pressure and if her systolic blood pressure consistently is 140 or greater she will increase her propranolol to 20 mg in morning or 10 mg at night or possibly 20 twice daily depending upon blood pressure response.  I also recommended that  if she does experience increased ectopy she can take an extra 10 mg propranolol on a as needed basis.  She continues to be on alprazolam as needed for anxiety.  She is on pantoprazole for GERD.  She continues to be on simvastatin 40 mg daily for hyperlipidemia.  LDL cholesterol in January 2023 was 69.  She will be seeing her primary physician later this year.  I will see her in 1 year for follow-up evaluation or sooner as needed.   Troy Sine, MD, [facility]  10/23/2021 9:58 AM

## 2021-11-10 ENCOUNTER — Other Ambulatory Visit: Payer: Self-pay | Admitting: Cardiovascular Disease

## 2021-11-11 ENCOUNTER — Encounter: Payer: Self-pay | Admitting: Internal Medicine

## 2021-11-11 DIAGNOSIS — R200 Anesthesia of skin: Secondary | ICD-10-CM

## 2021-11-12 LAB — HM MAMMOGRAPHY

## 2021-11-15 ENCOUNTER — Telehealth: Payer: Self-pay | Admitting: Cardiovascular Disease

## 2021-11-15 ENCOUNTER — Encounter: Payer: Self-pay | Admitting: Neurology

## 2021-11-15 NOTE — Telephone Encounter (Signed)
Patient called in with high blood pressure and chest pain. She stated that her blood pressure this morning was 127/68. A few hours later, she checked her blood pressure and it was 168/95. She stated she developed chest pain that did not radiate. The pain was constant and lasted for 30 minutes.  She currently takes Propranolol 10 mg bid. She took an extra propranolol 10 mg. This reduced her blood pressure to 131/82. She stated she then checked it a few minutes later and it was 167/93. She did say that she has been checking her blood pressure very few minutes all day. She has been advised that she is checking her pressure too much and that this can cause an increase in her pressure.   She currently takes propranolol 10 mg bid with instructions to take an extra tablet for a pressure over 023 systolic. She stated that she has been very stressed and in addition to the extra propranolol, she took a  Xanax.   She currently denies chest pain. She has been advised to try and relax and not take her blood pressure so often. She has been advised to check her blood pressure in a few hours and if it is elevated over 140 she may take another propranolol.   She has been advised that she may call the on call tonight or on the weekend if needed. Patient made aware of ED precautions should new or worsening symptoms occur. Patient verbalized understanding.   As a side note, she wanted Dr. [name] to know that she has been diagnosed with Polyneuropathy.

## 2021-11-15 NOTE — Telephone Encounter (Signed)
   Pt called office earlier to report elevated BP and was advised to take an additional propranolol 10 mg and repeat in 2 hours if necessary.  She took 10 mg at 3:30 and an additional 10 mg at 5:30 PM.  Blood pressures are still ranging in the 160/90-100 range.  Given multiple stacked doses of propranolol this afternoon, I advised that she should wait an additional 2 hours before repeating her blood pressure.  If systolic still greater than 160 at 10 PM, she can take an additional 10 mg.  If at any point she develops chest pain or dyspnea, she will need to seek ED evaluation.  She currently reports a mild headache and she will take Tylenol over-the-counter.  Caller verbalized understanding and was grateful for the call back.  Murray Hodgkins, NP 11/15/2021, 8:05 PM

## 2021-11-15 NOTE — Telephone Encounter (Signed)
Pt c/o BP issue: STAT if pt c/o blurred vision, one-sided weakness or slurred speech  1. What are your last 5 BP readings?  127/68 168/95 167/93  2. Are you having any other symptoms (ex. Dizziness, headache, blurred vision, passed out)? Chest pain  3. What is your BP issue? Patient is concerned about how her BP spiked just over the course of the day   Pt c/o of Chest Pain: STAT if CP now or developed within 24 hours  1. Are you having CP right now? no  2. Are you experiencing any other symptoms (ex. SOB, nausea, vomiting, sweating)? no  3. How long have you been experiencing CP? Started about three hours ago  4. Is your CP continuous or coming and going? Continuous dull ache   5. Have you taken Nitroglycerin? No. Patient has Mitral Valve Prolapse and so she does not take it  Patient says because of the Mitral Valve Prolapse she does get chest pain but it never coincides with her BP being high ?

## 2021-12-23 ENCOUNTER — Ambulatory Visit: Payer: BC Managed Care – PPO | Admitting: Nurse Practitioner

## 2022-01-01 ENCOUNTER — Ambulatory Visit: Payer: BC Managed Care – PPO | Admitting: Nurse Practitioner

## 2022-02-28 ENCOUNTER — Encounter: Payer: Self-pay | Admitting: Neurology

## 2022-02-28 ENCOUNTER — Ambulatory Visit: Payer: BC Managed Care – PPO | Admitting: Neurology

## 2022-02-28 VITALS — BP 167/91 | HR 73 | Ht 63.0 in | Wt 158.0 lb

## 2022-02-28 DIAGNOSIS — G629 Polyneuropathy, unspecified: Secondary | ICD-10-CM

## 2022-02-28 NOTE — Progress Notes (Signed)
[facility] Clinic Note - Initial Visit   Date: 02/28/2022   Grace George MRN: [id number] DOB: [birth year]   Dear Dr. Sharlet Salina:  Thank you for your kind referral of Grace George for consultation of neuropathy. Although her history is well known to you, please allow [name] to reiterate it for the purpose of our medical record. The patient was accompanied to the clinic by self.   Grace George is a 61 y.o. right-handed female with GERD, hypertension, hyperlipidemia, and migraine presenting for my opinion on neuropathy.   IMPRESSION/PLAN: Neuropathy affecting the feet manifesting with numbness.  She has been seeing Dr. Trula Ore for this and had prior NCS/EMG.  This report has been requested for my review. I had extensive discussion with the patient regarding the pathogenesis, etiology, management, and natural course of neuropathy. Neuropathy tends to be slowly progressive, especially if a treatable etiology is not identified.  I discussed that in the vast majority of cases, despite checking for reversible causes, we are unable to find the underlying etiology and management is symptomatic.  Unfortunately, there is nothing to help ease "numbness".  Further, I agree with Dr. Trula Ore that there is no medication or options to cure neuropathy.  The fact that symptoms are reduced when she takes xanax suggests that her anxiety is also playing a role.  She will continue to follow-up with Dr. Trula Ore. All questions answered.   ------------------------------------------------------------- History of present illness: Starting in 2021, she began having numbness/tingling of the hands and feet. She has been seeing Dr. Trula Ore at [facility] and was told there is no treatment for neuropathy, so is seeking a second opinion. Per patient, NCS/EMG of the legs which showed peripheral polyneuropathy and lumbosacral radiculopathy.    She has constant numbness in the feet.   She has relief with xanax 0.'5mg'$  which she takes for anxiety.  She tells me that when she was in [place] visiting her son, she was happier and did not notice her symptoms. She has some imbalance and had a fall in 2022.    The numbness in her hands occurs about once per month, lasting a few minutes.    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  She lives at home with husband.   Out-side paper records, electronic medical record, and images have been reviewed where available and summarized as:  Labs 09/19/2021:  SPEP with IFE no M protein, vitamin B1 10.4, vitamin B1 10, vitamin D 33, folate 14.3, vitamin B12 540  Lab Results  Component Value Date   HGBA1C 5.5 08/28/2020   Lab Results  Component Value Date   [id number] 017 06/27/2021   Lab Results  Component Value Date   TSH 2.71 06/27/2021   No results found for: "ESRSEDRATE", "POCTSEDRATE"  Past Medical History:  Diagnosis Date   Abnormal Pap smear    Allergic rhinitis    Allergy    Anemia    Anxiety    GERD (gastroesophageal reflux disease)    History of shingles 1/15   History of stress test 06/2011   Abnormal Myocadial perfusion scan demomstrating an attenuation defect in the anterior region of the myocardium, No ischemia or infaract/scar is seenin the remaining myocardium. Excercise capacity 12 METS.   HTN (hypertension)    Hx of echocardiogram 2009   Showed normal systolic and diastolic function. she does have borderline mitral valve prolapse.   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   Post-operative nausea and vomiting     Past Surgical History:  Procedure Laterality Date   CESAREAN SECTION     twice   COLONOSCOPY     CRYOABLATION     times 2 for abn paps   ESOPHAGOGASTRODUODENOSCOPY     HYSTEROSCOPY     LIGAMENT REPAIR Right 05/24/2020   Procedure: COLLATERAL LIGAMENT REPAIR  BOTH SIDES;  Surgeon: Hiram Gash, MD;  Location: [facility];  Service: Orthopedics;  Laterality: Right;   PATELLAR TENDON REPAIR Right 05/24/2020   Procedure: PATELLA TENDON REPAIR;  Surgeon: Hiram Gash, MD;  Location: [facility];  Service: Orthopedics;  Laterality: Right;   PATELLECTOMY Right 05/24/2020   Procedure: PATELLECTOMY;  Surgeon: Hiram Gash, MD;  Location: [facility] [facility];  Service: Orthopedics;  Laterality: Right;   WRIST SURGERY     right     Medications:  Outpatient Encounter Medications as of 02/28/2022  Medication Sig   ALPRAZolam (XANAX) 0.5 MG tablet TAKE 1 TABLET (0.5 MG TOTAL) BY MOUTH 2 (TWO) TIMES DAILY AS NEEDED FOR ANXIETY.   calcium carbonate (OS-CAL) 1250 (500 Ca) MG chewable tablet Chew 1 tablet by mouth daily.   levocetirizine (XYZAL) 5 MG tablet TAKE 1 TABLET BY MOUTH EVERY DAY IN THE EVENING   pantoprazole (PROTONIX) 40 MG tablet Take 1 tablet (40 mg total) by mouth as needed.   propranolol (INDERAL) 10 MG tablet Take 1 tablet (10 mg total) by mouth 2 (two) times daily.   simvastatin (ZOCOR) 40 MG tablet Take 1 tablet (40 mg total) by mouth daily at 6 PM.   No facility-administered encounter medications on file as of 02/28/2022.    Allergies:  Allergies  Allergen Reactions   Eggs Or Egg-Derived Products     Causes a rash, if she eats a lot!   Sulfa Antibiotics     Rash & itching    Family History: Family History  Problem Relation Age of Onset   Breast cancer Mother    Heart disease Mother    Diabetes Mother    Hypertension Mother    Prostate cancer Father    Neuropathy Father    Congestive Heart Failure Father    Diabetes Sister    Hypertension Sister    Heart disease Brother    Other Brother        CHF, had heart transplant    Social History: Social History   Tobacco Use   Smoking status: Never   Smokeless tobacco: Never  Substance Use Topics   Alcohol use: No    Alcohol/week: 0.0  standard drinks of alcohol   Drug use: No   Social History   Social History Narrative   Married, 2 kids   Employed - [employer] - in [place]   Never tobacco, drugs   No EtOH      Are you right handed or left handed? Right Handed    Are you currently employed ? No    What is your current occupation? No    Do you live at home alone? No   Who lives with you? Lives with husband    What type of home do you live in: 1 story or 2 story? Lives in a 2 story home         Vital Signs:  BP [name])  167/91 Comment: nervous  Pulse 73   Ht '5\' 3"'$  (1.6 m)   Wt 158 lb (71.7 kg)   LMP 03/28/2010   SpO2 100%   BMI 27.99 kg/m    Neurological Exam: MENTAL STATUS including orientation to time, place, person, recent and remote memory, attention span and concentration, language, and fund of knowledge is normal.  Speech is not dysarthric.  CRANIAL NERVES: II:  No visual field defects.   III-IV-VI: Pupils equal round and reactive to light.  Normal conjugate, extra-ocular eye movements in all directions of gaze.  No nystagmus.  No ptosis.   V:  Normal facial sensation.    VII:  Normal facial symmetry and movements.   VIII:  Normal hearing and vestibular function.   IX-X:  Normal palatal movement.   XI:  Normal shoulder shrug and head rotation.   XII:  Normal tongue strength and range of motion, no deviation or fasciculation.  MOTOR:  No atrophy, fasciculations or abnormal movements.  No pronator drift.   Upper Extremity:  Right  Left  Deltoid  5/5   5/5   Biceps  5/5   5/5   Triceps  5/5   5/5   Wrist extensors  5/5   5/5   Wrist flexors  5/5   5/5   Finger extensors  5/5   5/5   Finger flexors  5/5   5/5   Dorsal interossei  5/5   5/5   Abductor pollicis  5/5   5/5   Tone (Ashworth scale)  0  0   Lower Extremity:  Right  Left  Hip flexors  5/5   5/5   Knee flexors  5/5   5/5   Knee extensors  5/5   5/5   Dorsiflexors  5/5   5/5   Plantarflexors  5/5   5/5   Toe  extensors  5/5   5/5   Toe flexors  5/5   5/5   Tone (Ashworth scale)  0  0   MSRs:                                           Right        Left brachioradialis 2+  2+  biceps 2+  2+  triceps 2+  2+  patellar 2+  2+  ankle jerk 1+  1+  Hoffman no  no  plantar response down  down   SENSORY:  Vibration reduced in the feet bilaterally.  Temperature and pin prick intact throughout.   Romberg's sign absent.   COORDINATION/GAIT: Normal finger-to- nose-finger.  Intact rapid alternating movements bilaterally.  Gait narrow based and stable. Tandem and stressed gait intact.     Thank you for allowing me to participate in patient's care.  If I can answer any additional questions, I would be pleased to do so.    Sincerely,    Brannon Levene K. Posey Pronto, DO

## 2022-03-25 ENCOUNTER — Ambulatory Visit (INDEPENDENT_AMBULATORY_CARE_PROVIDER_SITE_OTHER): Payer: BC Managed Care – PPO

## 2022-03-25 DIAGNOSIS — Z23 Encounter for immunization: Secondary | ICD-10-CM

## 2022-06-06 ENCOUNTER — Other Ambulatory Visit (HOSPITAL_BASED_OUTPATIENT_CLINIC_OR_DEPARTMENT_OTHER): Payer: Self-pay | Admitting: General Practice

## 2022-08-03 ENCOUNTER — Other Ambulatory Visit: Payer: Self-pay | Admitting: Cardiovascular Disease

## 2022-08-14 ENCOUNTER — Ambulatory Visit: Payer: BC Managed Care – PPO | Admitting: Nurse Practitioner

## 2022-09-24 ENCOUNTER — Ambulatory Visit: Payer: BC Managed Care – PPO | Admitting: Radiology

## 2022-09-24 ENCOUNTER — Encounter: Payer: Self-pay | Admitting: Radiology

## 2022-09-24 VITALS — BP 150/80

## 2022-09-24 DIAGNOSIS — N898 Other specified noninflammatory disorders of vagina: Secondary | ICD-10-CM | POA: Diagnosis not present

## 2022-09-24 DIAGNOSIS — R350 Frequency of micturition: Secondary | ICD-10-CM

## 2022-09-24 DIAGNOSIS — I10 Essential (primary) hypertension: Secondary | ICD-10-CM

## 2022-09-24 DIAGNOSIS — N3091 Cystitis, unspecified with hematuria: Secondary | ICD-10-CM | POA: Diagnosis not present

## 2022-09-24 DIAGNOSIS — N3941 Urge incontinence: Secondary | ICD-10-CM

## 2022-09-24 LAB — WET PREP FOR TRICH, YEAST, CLUE

## 2022-09-24 MED ORDER — NITROFURANTOIN MONOHYD MACRO 100 MG PO CAPS: 100.0000 mg | ORAL_CAPSULE | Freq: Two times a day (BID) | ORAL | 0 refills | Status: DC

## 2022-09-24 NOTE — Progress Notes (Signed)
      Subjective: Grace George is a 62 y.o. female who complains of having trouble holding urine, urgency, frequency, vaginal irritation. Symptoms increased over the last 2-3 weeks after changing soap.     Review of Systems  Constitutional: Negative.   Genitourinary:  Positive for dysuria, frequency and urgency.       Urge incontinence    Past Medical History:  Diagnosis Date   Abnormal Pap smear    Allergic rhinitis    Allergy    Anemia    Anxiety    GERD (gastroesophageal reflux disease)    History of shingles 1/15   History of stress test 06/2011   Abnormal Myocadial perfusion scan demomstrating an attenuation defect in the anterior region of the myocardium, No ischemia or infaract/scar is seenin the remaining myocardium. Excercise capacity 12 METS.   HTN (hypertension)    Hx of echocardiogram 2009   Showed normal systolic and diastolic function. she does have borderline mitral valve prolapse.   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   -General: no acute distress -Vulva: without lesions or discharge -Vagina: discharge present, aptima swab and wet prep obtained -Cervix: no lesion or discharge, no CMT -Perineum: no lesions -Uterus: Mobile, non tender -Adnexa: no masses or tenderness  Urine dipstick shows positive for WBC's, positive for RBC's, positive for protein, positive for leukocytes, and positive for ketones.  Micro exam: 10-20 WBC's per HPF, 0-5 RBC's per HPF, and hylaine casts seen.  Microscopic wet-mount exam shows negative for pathogens, normal epithelial cells.   [name], CMA present for exam  Assessment:/Plan:  1. Urinary frequency -  Urinalysis,Complete w/RFL Culture  2. Urge incontinence of urine - Ambulatory referral to Physical Therapy  3. Cystitis with hematuria Macrobid po BID x 5 days  4. Vaginal irritation Reassured neg wet mount - WET PREP FOR TRICH, YEAST, CLUE  5. Elevated blood pressure reading in office with diagnosis of hypertension Consistency with meds F/u with PCP if it remains elevated    Will contact patient with results of testing completed today. Avoid intercourse until symptoms are resolved. Safe sex encouraged. Avoid the use of soaps or perfumed products in the peri area. Avoid tub baths and sitting in sweaty or wet clothing for prolonged periods of time.

## 2022-09-25 LAB — URINALYSIS, COMPLETE W/RFL CULTURE
Crystals: NONE SEEN /HPF
Glucose, UA: NEGATIVE
Nitrites, Initial: NEGATIVE
Specific Gravity, Urine: 1.015 (ref 1.001–1.035)
Yeast: NONE SEEN /HPF
pH: 6.5 (ref 5.0–8.0)

## 2022-09-25 LAB — URINE CULTURE
MICRO NUMBER:: 15014153
Result:: NO GROWTH
SPECIMEN QUALITY:: ADEQUATE

## 2022-09-25 LAB — CULTURE INDICATED

## 2022-10-01 ENCOUNTER — Telehealth: Payer: Self-pay

## 2022-10-01 ENCOUNTER — Telehealth: Payer: Self-pay | Admitting: *Deleted

## 2022-10-01 NOTE — Telephone Encounter (Signed)
Vaginal contamination is typically the cause. It was not a clean catch.

## 2022-10-01 NOTE — Telephone Encounter (Signed)
Pt calling to inquire as to why there are many abnormal results with UA done on 5/29 and was provided with abx initially for the Ucx to confirm there was nothing.? Please advise.

## 2022-10-01 NOTE — Telephone Encounter (Signed)
Pt notified and voiced understanding. Will close encounter.  

## 2022-10-01 NOTE — Telephone Encounter (Signed)
Spoke with patient. Patient f/u on urine culture results dated 09/24/22.   Patient states she completed 5 days of abx, is now noticing some blood in her urine. States she is not for certain the blood is in urine or vaginal. Denies flank pain, fever/chills, nausea/vomiting. Drinks a lot of water, frequency is not uncommon for her. Reports urgency has resolved. Advised patient to keep AEX as scheduled with Jami on 10/02/22 to further evaluate and discuss. ER precautions provided for new/worsening symptoms. Will update Jami and return call if any additional recommendations. Patient agreeable.   Routing to [place] for final review.

## 2022-10-02 ENCOUNTER — Ambulatory Visit (INDEPENDENT_AMBULATORY_CARE_PROVIDER_SITE_OTHER): Payer: BC Managed Care – PPO | Admitting: Radiology

## 2022-10-02 ENCOUNTER — Encounter: Payer: Self-pay | Admitting: Radiology

## 2022-10-02 VITALS — BP 140/82 | Ht 62.5 in | Wt 162.0 lb

## 2022-10-02 DIAGNOSIS — I10 Essential (primary) hypertension: Secondary | ICD-10-CM

## 2022-10-02 DIAGNOSIS — Z01419 Encounter for gynecological examination (general) (routine) without abnormal findings: Secondary | ICD-10-CM

## 2022-10-02 DIAGNOSIS — R319 Hematuria, unspecified: Secondary | ICD-10-CM

## 2022-10-02 NOTE — Progress Notes (Signed)
   Grace George [birth year] [id number]   History: Postmenopausal 62 y.o. presents for annual exam. Was treated presumptively for Uti last week , culture was negative, stopped antibiotics however reports hematuria still. No dysuria, frequency or urgency.   Gynecologic History Postmenopausal Last Pap: 2022. Results were: normal Last mammogram: 7/23. Results were: normal Last colonoscopy: 2020   Obstetric History OB History  Gravida Para Term Preterm AB Living  2 2 2     2   SAB IAB Ectopic Multiple Live Births          2    # Outcome Date GA Lbr Len/2nd Weight Sex Delivery Anes PTL Lv  2 Term     M CS-Classical   LIV  1 Term     M CS-Classical   LIV     The following portions of the patient's history were reviewed and updated as appropriate: allergies, current medications, past family history, past medical history, past social history, past surgical history, and problem list.  Review of Systems Pertinent items noted in HPI and remainder of comprehensive ROS otherwise negative.  Past medical history, past surgical history, family history and social history were all reviewed and documented in the EPIC chart.  Exam:  Vitals:   10/02/22 1011 10/02/22 1016  BP: (!) 148/84 (!) 140/82  Weight: 162 lb (73.5 kg)   Height: 5' 2.5" (1.588 m)    Body mass index is 29.16 kg/m.  General appearance:  Normal Thyroid:  Symmetrical, normal in size, without palpable masses or nodularity. Respiratory  Auscultation:  Clear without wheezing or rhonchi Cardiovascular  Auscultation:  Regular rate, without rubs, murmurs or gallops  Edema/varicosities:  Not grossly evident Abdominal  Soft,nontender, without masses, guarding or rebound.  Liver/spleen:  No organomegaly noted  Hernia:  None appreciated  Skin  Inspection:  Grossly normal Breasts: Examined lying and sitting.   Right: Without masses, retractions, nipple discharge or axillary adenopathy.   Left: Without masses, retractions,  nipple discharge or axillary adenopathy. Genitourinary   Inguinal/mons:  Normal without inguinal adenopathy  External genitalia:  Normal appearing vulva with no masses, tenderness, or lesions  BUS/Urethra/Skene's glands:  Normal  Vagina:  Normal appearing with normal color and discharge, no lesions. Atrophy: mild   Cervix:  Normal appearing without discharge or lesions  Uterus:  Normal in size, shape and contour.  Midline and mobile, nontender  Adnexa/parametria:     Rt: Normal in size, without masses or tenderness.   Lt: Normal in size, without masses or tenderness.  Anus and perineum: Normal   [name], CMA present for exam  Urine dipstick shows positive for leukocytes.  Micro exam: 0-5 WBC's per HPF.   Assessment/Plan:   1. Well woman exam with routine gynecological exam Pap up to date repeat due 2025  2. Elevated blood pressure reading in office with diagnosis of hypertension Continue to monitor Followed by PCP  3. Hematuria, unspecified type Negative blood in urine, reassured not coming from the vagina either ? Rectal bleeding with BMs, will monitor - Urinalysis,Complete w/RFL Culture    Discussed SBE, colonoscopy and DEXA screening as directed. Recommend of exercise weekly, including weight bearing exercise. Encouraged the use of seatbelts and sunscreen.  Return in 1 year for annual or sooner prn.  Arlie Solomons B WHNP-BC, 10:28 AM 10/02/2022

## 2022-10-03 ENCOUNTER — Telehealth: Payer: Self-pay

## 2022-10-03 LAB — URINE CULTURE
MICRO NUMBER:: 15049701
Result:: NO GROWTH
SPECIMEN QUALITY:: ADEQUATE

## 2022-10-03 LAB — URINALYSIS, COMPLETE W/RFL CULTURE
Bacteria, UA: NONE SEEN /HPF
Bilirubin Urine: NEGATIVE
Glucose, UA: NEGATIVE
Hgb urine dipstick: NEGATIVE
Hyaline Cast: NONE SEEN /LPF
Ketones, ur: NEGATIVE
Nitrites, Initial: NEGATIVE
Protein, ur: NEGATIVE
RBC / HPF: NONE SEEN /HPF (ref 0–2)
Specific Gravity, Urine: 1.002 (ref 1.001–1.035)
pH: 6.5 (ref 5.0–8.0)

## 2022-10-03 LAB — CULTURE INDICATED

## 2022-10-03 NOTE — Telephone Encounter (Signed)
Patient was seen in office on 10/02/22.   Encounter closed.

## 2022-10-03 NOTE — Telephone Encounter (Signed)
Pt LVM in triage line regarding recent UA results.   Spoke w/ pt and she inquired about the "none seen" result next to the "RBCs" when it says that the ref range should be "0-2."   Pt advised that the result meant the there was no blood seen in her urine sample and that was good and we will notify her once we receive the culture results.  She voiced understanding. Will route to provider and close.

## 2022-10-23 NOTE — Progress Notes (Unsigned)
Cardiology Office Note:    Date:  10/27/2022   ID:  SOLI ORAVETZ, DOB 16-May-1960, MRN [id number]  PCP:  Myrlene Broker, MD   [facility] Providers Cardiologist:  Nicki Guadalajara, MD      Referring MD: Hillard Danker A, *   Follow-up for atrial fibrillation and essential hypertension.  History of Present Illness:    Grace George is a 62 y.o. female with a hx of HLD, HTN, and palpitations.  She had a echocardiogram in 2009 which showed normal systolic and diastolic function.  She was also noted to have borderline mitral valve prolapse.  She was started on atenolol 50 mg daily for palpitations and was also taking simvastatin.  She followed up with Dr. Tresa Endo 2022 and continues to be stable from a cardiac standpoint.  She did note some nonexertional left arm discomfort and reported elevated blood pressure.  She presented to her PCP and her blood pressure was found to be 160/84.  Losartan 50 mg daily was added to her atenolol antihypertensive medication regimen.  She took losartan for approximately 1 week and reported blood pressures in 112-118 systolic range.  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  She was seen in follow-up by Dr. Tresa Endo on 05/09/2021.  During that time she reported that she had broken her kneecap in early 2022.  She had not been working.  She was no longer taking atenolol or losartan.  She reported that her blood pressure had been low.  She denied chest discomfort.  She continued to have periods of palpitations.  She reported compliance with her simvastatin and pantoprazole.  She had also been prescribed alprazolam as needed for anxiety.  Her blood pressure was 140/80.  She was started on metoprolol succinate 12.5 mg daily and follow-up was planned for 6 months.  She presented to the emergency department 06/17/2021.  Her blood pressure at that time was noted to be 170/108.  She reported that her blood pressure has been elevated for 2 days and she  had an associated headache.  She was contacted by the nursing triage line on 06/18/2021 and reported that her blood pressure was 118/71.  She presented to the clinic 06/20/2021 for follow-up evaluation stated she was recently in the emergency department with elevated blood pressure and anxiety.  Her EKG at that time showed atrial fibrillation and  on repeat showed sinus rhythm with arrhythmia.  Her EKG 06/20/2021 showed sinus tachycardia with premature atrial complexes 105 bpm.  We reviewed her previous EKGs and it did not appear that she had any atrial fibrillation.  She expressed understanding.  She had been very anxious about her neuropathy, palpitations, and her general health.  She noted that her pulse was in the 50s with Toprol tartrate and she discontinued the medication.  We used shared decision-making to discuss options for treatment.  I  placed her on propranolol 10 mg twice daily, asked her to check her blood pressure and pulse 3-4 times per week.  I  asked her to manually check her pulse for accuracy.  I  also gave her the mindfulness stress reduction sheet and planned follow-up for 1 month.  She presented to the clinic 07/24/21  for follow-up evaluation stated she felt well.  She felt that the propranolol medication had helped with her palpitations.  She reported that her blood pressures in the morning was around 117/63.  She did note some pulses in the 40-50 range.  She denied lightheadedness, presyncope  or syncope with these heart rates.  In the clinic her blood pressure was initially 176/82.  I felt that there was some aspect of whitecoat hypertension here.  On recheck it was 138/78.  She continued to walk 30 minutes daily.  I will have her continue her blood pressure log and check her blood pressure 2 times per week.  I planned follow-up in around 4 months with Dr. Tresa Endo.  She was seen in follow-up by Dr. Tresa Endo on 10/23/2021.  During that time she reported that she had not had palpitations.  She  continued propranolol twice daily.  Her blood pressure was in the 105/65 range.  She denied chest pain shortness of breath.  She presents to the clinic today for follow-up evaluation and states she continues to be active walking 30 minutes/day.  Her recent lab work is stable and reassuring.  We reviewed these and she expressed understanding.  She continues to have left arm discomfort that is unchanged.  This appears to be related to nerve versus muscle discomfort.  Her palpitations are well-controlled with propranolol.  Her blood pressure at home is well-controlled in the low 100s systolic.  In the clinic today her blood pressure is 136/62 and her pulse is 75.  We will plan follow-up in 12 months.  Today she denies chest pain, shortness of breath, lower extremity edema, fatigue, palpitations, melena, hematuria, hemoptysis, diaphoresis, weakness, presyncope, syncope, orthopnea, and PND.    Past Medical History:  Diagnosis Date   Abnormal Pap smear    Allergic rhinitis    Allergy    Anemia    Anxiety    GERD (gastroesophageal reflux disease)    History of shingles 04/2013   History of stress test 06/2011   Abnormal Myocadial perfusion scan demomstrating an attenuation defect in the anterior region of the myocardium, No ischemia or infaract/scar is seenin the remaining myocardium. Excercise capacity 12 METS.   HTN (hypertension)    Hx of echocardiogram 2009   Showed normal systolic and diastolic function. she does have borderline mitral valve prolapse.   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   Post-operative nausea and vomiting     Past Surgical History:  Procedure Laterality Date   CESAREAN SECTION     twice   COLONOSCOPY     CRYOABLATION     times 2 for abn paps   ESOPHAGOGASTRODUODENOSCOPY     HYSTEROSCOPY     LIGAMENT REPAIR Right  05/24/2020   Procedure: COLLATERAL LIGAMENT REPAIR BOTH SIDES;  Surgeon: Bjorn Pippin, MD;  Location: [facility];  Service: Orthopedics;  Laterality: Right;   PATELLAR TENDON REPAIR Right 05/24/2020   Procedure: PATELLA TENDON REPAIR;  Surgeon: Bjorn Pippin, MD;  Location: [facility];  Service: Orthopedics;  Laterality: Right;   PATELLECTOMY Right 05/24/2020   Procedure: PATELLECTOMY;  Surgeon: Bjorn Pippin, MD;  Location:  SURGERY CENTER;  Service: Orthopedics;  Laterality: Right;   WRIST SURGERY     right    Current Medications: Current Meds  Medication Sig   ALPRAZolam (XANAX) 0.5 MG tablet TAKE 1 TABLET (0.5 MG TOTAL) BY MOUTH 2 (TWO) TIMES DAILY AS NEEDED FOR ANXIETY.   BIOTIN PO Take 1 tablet by mouth once a week.   calcium carbonate (OS-CAL) 1250 (500 Ca)  MG chewable tablet Chew 1 tablet by mouth daily.   Cholecalciferol (VITAMIN D-3 PO) Take 1 tablet by mouth daily.   Cyanocobalamin (B-12 PO) Take 1 tablet by mouth daily in the afternoon.   levocetirizine (XYZAL) 5 MG tablet TAKE 1 TABLET BY MOUTH EVERY DAY IN THE EVENING   propranolol (INDERAL) 10 MG tablet TAKE 1 TABLET BY MOUTH TWICE A DAY   simvastatin (ZOCOR) 40 MG tablet TAKE 1 TABLET BY MOUTH DAILY AT 6 PM.   thiamine (VITAMIN B1) 100 MG tablet Take 100 mg by mouth 2 (two) times daily.     Allergies:   Egg-derived products and Sulfa antibiotics   Social History   Socioeconomic History   Marital status: Married    Spouse name: Not on file   Number of children: 2   Years of education: Not on file   Highest education level: Not on file  Occupational History    Employer: [employer]  Tobacco Use   Smoking status: Never    Passive exposure: Never   Smokeless tobacco: Never  Substance and Sexual Activity   Alcohol use: No    Alcohol/week: 0.0 standard drinks of alcohol   Drug use: No   Sexual activity: Not Currently    Partners: Male    Birth control/protection:  Post-menopausal  Other Topics Concern   Not on file  Social History Narrative   Married, 2 kids   Employed - [employer] - in [place]   Never tobacco, drugs   No EtOH      Are you right handed or left handed? Right Handed    Are you currently employed ? No    What is your current occupation? No    Do you live at home alone? No   Who lives with you? Lives with husband    What type of home do you live in: 1 story or 2 story? Lives in a 2 story home        Social Determinants of Health   Financial Resource Strain: Not on file  Food Insecurity: Not on file  Transportation Needs: Not on file  Physical Activity: Not on file  Stress: Not on file  Social Connections: Not on file     Family History: The patient's family history includes Breast cancer in her mother; Congestive Heart Failure in her father; Diabetes in her mother and sister; Heart disease in her brother and mother; Hypertension in her mother and sister; Neuropathy in her father; Other in her brother; Prostate cancer in her father.  ROS:   Please see the history of present illness.     All other systems reviewed and are negative.   Risk Assessment/Calculations:           Physical Exam:    VS:  BP 136/62 (BP Location: Left Arm, Patient Position: Sitting, Cuff Size: Normal)   Pulse 75   Ht 5\' 3"  (1.6 m)   Wt 157 lb 3.2 oz (71.3 kg)   LMP 03/28/2010   SpO2 99%   BMI 27.85 kg/m     Wt Readings from Last 3 Encounters:  10/27/22 157 lb 3.2 oz (71.3 kg)  10/02/22 162 lb (73.5 kg)  02/28/22 158 lb (71.7 kg)     GEN:  Well nourished, well developed in no acute distress HEENT: Normal NECK: No JVD; No carotid bruits LYMPHATICS: No lymphadenopathy CARDIAC: RRR, no murmurs, rubs, gallops RESPIRATORY:  Clear to auscultation without rales, wheezing or rhonchi  ABDOMEN: Soft, non-tender, non-distended MUSCULOSKELETAL:  No edema; No deformity  SKIN: Warm and dry NEUROLOGIC:  Alert and oriented x  3 PSYCHIATRIC:  Normal affect    EKGs/Labs/Other Studies Reviewed:    The following studies were reviewed today:  Echocardiogram 10/03/2015  Study Conclusions   - Left ventricle: The cavity size was normal. Wall thickness was    normal. The estimated ejection fraction was 55%. Wall motion was    normal; there were no regional wall motion abnormalities.    Features are consistent with a pseudonormal left ventricular    filling pattern, with concomitant abnormal relaxation and    increased filling pressure (grade 2 diastolic dysfunction).  - Aortic valve: There was no stenosis.  - Mitral valve: Somewhat flattened closure of the mitral valve    without frank prolapse. There was trivial regurgitation.  - Left atrium: The atrium was mildly dilated.  - Right ventricle: The cavity size was normal. Systolic function    was normal.  - Tricuspid valve: Peak RV-RA gradient (S): 22 mm Hg.  - Pulmonary arteries: PA peak pressure: 25 mm Hg (S).  - Inferior vena cava: The vessel was normal in size. The    respirophasic diameter changes were in the normal range (>= 50%),    consistent with normal central venous pressure.   Impressions:   - Normal LV size with EF 55%. Flattened closure of the mitral valve    without frank prolapse. Trivial mitral regurgitation. Normal RV    size and systolic function.   EKG:  EKG is  ordered today.  Sinus rhythm with premature supraventricular complexes 75 bpm.    Recent Labs: 10/24/2022: ALT 18; BUN 10; Creatinine, Ser 0.69; Hemoglobin 13.7; Platelets 164; Potassium 5.2; Sodium 144  Recent Lipid Panel    Component Value Date/Time   CHOL 134 10/24/2022 1126   TRIG 47 10/24/2022 1126   HDL 68 10/24/2022 1126   CHOLHDL 2.0 10/24/2022 1126   CHOLHDL 2 12/07/2018 0909   VLDL 10.4 12/07/2018 0909   LDLCALC 55 10/24/2022 1126    ASSESSMENT & PLAN    Palpitations-heart rate today 75.  Denies further episodes of irregular or accelerated heart  rate. Continue propranolol Maintain physical activity Avoid triggers caffeine, chocolate, EtOH, dehydration etc.-reviewed  Essential hypertension-BP today 136/62.  Well-controlled at home. Maintain blood pressure log Continue propranolol Continue low-sodium diet Maintain physical activity-walking 30 minutes/day  Hyperlipidemia-LDL 55 on 10/24/22. Continue aspirin, simvastatin Increase fiber in diet  Anxiety-well-managed with as needed Xanax.   Follows with PCP  Disposition: Follow-up with Dr. Tresa Endo or me in 9-12 months.       Medication Adjustments/Labs and Tests Ordered: Current medicines are reviewed at length with the patient today.  Concerns regarding medicines are outlined above.  Orders Placed This Encounter  Procedures   EKG 12-Lead   No orders of the defined types were placed in this encounter.   Patient Instructions  Medication Instructions:  Your physician recommends that you continue on your current medications as directed. Please refer to the Current Medication list given to you today.  *If you need a refill on your cardiac medications before your next appointment, please call your pharmacy*   Lab Work: none If you have labs (blood work) drawn today and your tests are completely normal, you will receive your results only by: MyChart Message (if you have MyChart) OR A paper copy in the mail If you have any lab test that is abnormal or we need to change  your treatment, we will call you to review the results.   Testing/Procedures: none   Follow-Up: At [facility], you and your health needs are our priority.  As part of our continuing mission to provide you with exceptional heart care, we have created designated Provider Care Teams.  These Care Teams include your primary Cardiologist (physician) and Advanced Practice Providers (APPs -  Physician Assistants and Nurse Practitioners) who all work together to provide you with the care you need, when you  need it.  We recommend signing up for the patient portal called "MyChart".  Sign up information is provided on this After Visit Summary.  MyChart is used to connect with patients for Virtual Visits (Telemedicine).  Patients are able to view lab/test results, encounter notes, upcoming appointments, etc.  Non-urgent messages can be sent to your provider as well.   To learn more about what you can do with MyChart, go to [email or web address].    Your next appointment:   1 year(s)  Provider:   Nicki Guadalajara, MD     Other Instructions Maintain physical activity and diet    Signed, Ronney Asters, NP  10/27/2022 11:48 AM      Notice: This dictation was prepared with Dragon dictation along with smaller phrase technology. Any transcriptional errors that result from this process are unintentional and may not be corrected upon review.  I spent 14 minutes examining this patient, reviewing medications, and using patient centered shared decision making involving her cardiac care.  Prior to her visit I spent greater than 20 minutes reviewing her past medical history,  medications, and prior cardiac tests.

## 2022-10-24 ENCOUNTER — Telehealth: Payer: Self-pay

## 2022-10-24 ENCOUNTER — Other Ambulatory Visit: Payer: Self-pay

## 2022-10-24 DIAGNOSIS — I10 Essential (primary) hypertension: Secondary | ICD-10-CM

## 2022-10-24 NOTE — Telephone Encounter (Signed)
Patient would like to have labs drawn before her appointment with you on Monday. What labs would you like to draw?

## 2022-10-24 NOTE — Telephone Encounter (Signed)
Patient will come and get labs today

## 2022-10-25 LAB — CBC
Hematocrit: 42.9 % (ref 34.0–46.6)
Hemoglobin: 13.7 g/dL (ref 11.1–15.9)
MCH: 29.7 pg (ref 26.6–33.0)
MCHC: 31.9 g/dL (ref 31.5–35.7)
MCV: 93 fL (ref 79–97)
Platelets: 164 10*3/uL (ref 150–450)
RBC: 4.62 x10E6/uL (ref 3.77–5.28)
RDW: 13.1 % (ref 11.7–15.4)
WBC: 4.1 10*3/uL (ref 3.4–10.8)

## 2022-10-25 LAB — COMPREHENSIVE METABOLIC PANEL
ALT: 18 IU/L (ref 0–32)
AST: 25 IU/L (ref 0–40)
Albumin: 4.5 g/dL (ref 3.9–4.9)
Alkaline Phosphatase: 118 IU/L (ref 44–121)
BUN/Creatinine Ratio: 14 (ref 12–28)
BUN: 10 mg/dL (ref 8–27)
Bilirubin Total: 0.6 mg/dL (ref 0.0–1.2)
CO2: 26 mmol/L (ref 20–29)
Calcium: 10 mg/dL (ref 8.7–10.3)
Chloride: 107 mmol/L — ABNORMAL HIGH (ref 96–106)
Creatinine, Ser: 0.69 mg/dL (ref 0.57–1.00)
Globulin, Total: 2.2 g/dL (ref 1.5–4.5)
Glucose: 92 mg/dL (ref 70–99)
Potassium: 5.2 mmol/L (ref 3.5–5.2)
Sodium: 144 mmol/L (ref 134–144)
Total Protein: 6.7 g/dL (ref 6.0–8.5)
eGFR: 97 mL/min/{1.73_m2} (ref >59)

## 2022-10-25 LAB — LIPID PANEL
Chol/HDL Ratio: 2 ratio (ref 0.0–4.4)
Cholesterol, Total: 134 mg/dL (ref 100–199)
HDL: 68 mg/dL (ref >39)
LDL Chol Calc (NIH): 55 mg/dL (ref 0–99)
Triglycerides: 47 mg/dL (ref 0–149)
VLDL Cholesterol Cal: 11 mg/dL (ref 5–40)

## 2022-10-27 ENCOUNTER — Ambulatory Visit: Payer: BC Managed Care – PPO | Attending: General Practice | Admitting: General Practice

## 2022-10-27 ENCOUNTER — Encounter: Payer: Self-pay | Admitting: General Practice

## 2022-10-27 VITALS — BP 136/62 | HR 75 | Ht 63.0 in | Wt 157.2 lb

## 2022-10-27 DIAGNOSIS — I059 Rheumatic mitral valve disease, unspecified: Secondary | ICD-10-CM | POA: Diagnosis not present

## 2022-10-27 DIAGNOSIS — E7800 Pure hypercholesterolemia, unspecified: Secondary | ICD-10-CM

## 2022-10-27 DIAGNOSIS — I491 Atrial premature depolarization: Secondary | ICD-10-CM | POA: Diagnosis not present

## 2022-10-27 DIAGNOSIS — F419 Anxiety disorder, unspecified: Secondary | ICD-10-CM | POA: Diagnosis not present

## 2022-10-27 NOTE — Patient Instructions (Signed)
Medication Instructions:  Your physician recommends that you continue on your current medications as directed. Please refer to the Current Medication list given to you today.  *If you need a refill on your cardiac medications before your next appointment, please call your pharmacy*   Lab Work: none If you have labs (blood work) drawn today and your tests are completely normal, you will receive your results only by: MyChart Message (if you have MyChart) OR A paper copy in the mail If you have any lab test that is abnormal or we need to change your treatment, we will call you to review the results.   Testing/Procedures: none   Follow-Up: At [facility], you and your health needs are our priority.  As part of our continuing mission to provide you with exceptional heart care, we have created designated Provider Care Teams.  These Care Teams include your primary Cardiologist (physician) and Advanced Practice Providers (APPs -  Physician Assistants and Nurse Practitioners) who all work together to provide you with the care you need, when you need it.  We recommend signing up for the patient portal called "MyChart".  Sign up information is provided on this After Visit Summary.  MyChart is used to connect with patients for Virtual Visits (Telemedicine).  Patients are able to view lab/test results, encounter notes, upcoming appointments, etc.  Non-urgent messages can be sent to your provider as well.   To learn more about what you can do with MyChart, go to [email or web address].    Your next appointment:   1 year(s)  Provider:   Nicki Guadalajara, MD     Other Instructions Maintain physical activity and diet

## 2022-11-10 ENCOUNTER — Encounter: Payer: Self-pay | Admitting: Internal Medicine

## 2022-11-10 ENCOUNTER — Ambulatory Visit: Payer: BC Managed Care – PPO | Admitting: Internal Medicine

## 2022-11-10 VITALS — BP 138/80 | HR 67 | Temp 98.2°F | Ht 63.0 in | Wt 156.0 lb

## 2022-11-10 DIAGNOSIS — H9192 Unspecified hearing loss, left ear: Secondary | ICD-10-CM

## 2022-11-10 DIAGNOSIS — J309 Allergic rhinitis, unspecified: Secondary | ICD-10-CM

## 2022-11-10 DIAGNOSIS — Z0000 Encounter for general adult medical examination without abnormal findings: Secondary | ICD-10-CM | POA: Diagnosis not present

## 2022-11-10 DIAGNOSIS — K219 Gastro-esophageal reflux disease without esophagitis: Secondary | ICD-10-CM

## 2022-11-10 DIAGNOSIS — E7800 Pure hypercholesterolemia, unspecified: Secondary | ICD-10-CM

## 2022-11-10 DIAGNOSIS — I10 Essential (primary) hypertension: Secondary | ICD-10-CM

## 2022-11-10 DIAGNOSIS — G609 Hereditary and idiopathic neuropathy, unspecified: Secondary | ICD-10-CM

## 2022-11-10 MED ORDER — TRIAMCINOLONE ACETONIDE 0.1 % EX CREA
1.0000 | TOPICAL_CREAM | Freq: Two times a day (BID) | CUTANEOUS | 3 refills | Status: DC
Start: 2022-11-10 — End: 2023-05-28

## 2022-11-10 MED ORDER — LEVOCETIRIZINE DIHYDROCHLORIDE 5 MG PO TABS
ORAL_TABLET | ORAL | 3 refills | Status: DC
Start: 2022-11-10 — End: 2023-10-22

## 2022-11-10 NOTE — Assessment & Plan Note (Signed)
Taking propranolol 10 mg BID and recent labs normal. Continue same treatment.

## 2022-11-10 NOTE — Assessment & Plan Note (Signed)
Flu shot yearly. Shingrix complete. Tdap up to date. Colonoscopy due 2027. Mammogram up to date, pap smear up to date. Counseled about sun safety and mole surveillance. Counseled about the dangers of distracted driving. Given 10 year screening recommendations.

## 2022-11-10 NOTE — Assessment & Plan Note (Signed)
Gradual and referral done to audiology for hearing assessment.

## 2022-11-10 NOTE — Assessment & Plan Note (Signed)
Refilled xyzal 5 mg daily which she is taking.

## 2022-11-10 NOTE — Assessment & Plan Note (Signed)
Taking simvastatin 40 mg daily and recent labs stable. Continue at same dosing.

## 2022-11-10 NOTE — Assessment & Plan Note (Signed)
Numbness in feet and lower legs. Seen 2 neurologists and is frustrated with this. No pain so medication not indicated. This is causing some mood change which is understandable.

## 2022-11-10 NOTE — Progress Notes (Signed)
   Subjective:   Patient ID: Grace George, female    DOB: 23-Jul-1960, 62 y.o.   MRN: [id number]  HPI The patient is here for physical.  PMH, [facility], social history reviewed and updated  Review of Systems  Constitutional: Negative.   HENT: Negative.    Eyes: Negative.   Respiratory:  Negative for cough, chest tightness and shortness of breath.   Cardiovascular:  Negative for chest pain, palpitations and leg swelling.  Gastrointestinal:  Negative for abdominal distention, abdominal pain, constipation, diarrhea, nausea and vomiting.  Musculoskeletal: Negative.   Skin: Negative.   Neurological:  Positive for numbness.  Psychiatric/Behavioral: Negative.      Objective:  Physical Exam Constitutional:      Appearance: She is well-developed.  HENT:     Head: Normocephalic and atraumatic.  Cardiovascular:     Rate and Rhythm: Normal rate and regular rhythm.  Pulmonary:     Effort: Pulmonary effort is normal. No respiratory distress.     Breath sounds: Normal breath sounds. No wheezing or rales.  Abdominal:     General: Bowel sounds are normal. There is no distension.     Palpations: Abdomen is soft.     Tenderness: There is no abdominal tenderness. There is no rebound.  Musculoskeletal:     Cervical back: Normal range of motion.  Skin:    General: Skin is warm and dry.  Neurological:     Mental Status: She is alert and oriented to person, place, and time.     Coordination: Coordination normal.     Vitals:   11/10/22 1039  BP: 138/80  Pulse: 67  Temp: 98.2 F (36.8 C)  TempSrc: Oral  SpO2: 99%  Weight: 156 lb (70.8 kg)  Height: 5\' 3"  (1.6 m)    Assessment & Plan:

## 2022-11-10 NOTE — Assessment & Plan Note (Signed)
Uses rare protonix and does not need refill today. Okay to refill if/when needed.

## 2022-11-12 ENCOUNTER — Ambulatory Visit: Payer: BC Managed Care – PPO | Admitting: Physical Therapy

## 2022-11-27 ENCOUNTER — Encounter: Payer: Self-pay | Admitting: Radiology

## 2022-12-10 ENCOUNTER — Ambulatory Visit: Payer: BC Managed Care – PPO | Admitting: Nurse Practitioner

## 2022-12-30 ENCOUNTER — Telehealth: Payer: Self-pay | Admitting: Internal Medicine

## 2022-12-30 NOTE — Telephone Encounter (Signed)
Prescription Request  12/30/2022  LOV: 11/10/2022  What is the name of the medication or equipment? pantoprazole  Have you contacted your pharmacy to request a refill? Yes   Which pharmacy would you like this sent to?  [id number] [place], [place] - [address] [place] [address] Phone: [phone] Fax: [phone]    Patient notified that their request is being sent to the clinical staff for review and that they should receive a response within 2 business days.   Please advise at Mobile [phone] (mobile)

## 2023-01-01 ENCOUNTER — Other Ambulatory Visit: Payer: Self-pay

## 2023-01-01 DIAGNOSIS — K219 Gastro-esophageal reflux disease without esophagitis: Secondary | ICD-10-CM

## 2023-01-01 MED ORDER — PANTOPRAZOLE SODIUM 40 MG PO TBEC
40.0000 mg | DELAYED_RELEASE_TABLET | ORAL | 3 refills | Status: AC | PRN
Start: 2023-01-01 — End: ?

## 2023-01-01 NOTE — Telephone Encounter (Signed)
Refill done.  

## 2023-01-15 ENCOUNTER — Ambulatory Visit: Payer: BC Managed Care – PPO | Admitting: Nurse Practitioner

## 2023-01-15 VITALS — BP 128/84 | HR 53 | Temp 98.1°F | Ht 63.0 in | Wt 159.2 lb

## 2023-01-15 DIAGNOSIS — H539 Unspecified visual disturbance: Secondary | ICD-10-CM | POA: Diagnosis not present

## 2023-01-15 DIAGNOSIS — J0110 Acute frontal sinusitis, unspecified: Secondary | ICD-10-CM | POA: Diagnosis not present

## 2023-01-15 LAB — POCT RESPIRATORY SYNCYTIAL VIRUS: RSV Rapid Ag: NEGATIVE

## 2023-01-15 LAB — POCT INFLUENZA A/B
Influenza A, POC: NEGATIVE
Influenza B, POC: NEGATIVE

## 2023-01-15 LAB — POC COVID19 BINAXNOW: SARS Coronavirus 2 Ag: NEGATIVE

## 2023-01-15 NOTE — Progress Notes (Signed)
Established Patient Office Visit  Subjective   Patient ID: Grace George, female    DOB: 1961/03/09  Age: 62 y.o. MRN: [id number]  Chief Complaint  Patient presents with   Sinus Problem    Pressure in the area which is affecting her eye ( blurry vision)    Patient has today for acute visit for the above.  Symptoms started within the last week.  Reports that they are getting better today.  Mainly experiencing nasal congestion and some sinus pain.  Also noticed some blurry vision and flashing light in the left eye.  No significant eyeball pain.  Does have history of migraine with aura.  Reports normally her aura last about 20 minutes and then will resolve, this time the aura is continuous.  Has not noted any other visual deficits.  Has taken Tylenol a few days ago, also remains on her chronic Xyzal.    Review of Systems  Constitutional:  Negative for chills and fever.  HENT:  Positive for congestion and sinus pain. Negative for sore throat.        (+) PND  Eyes:  Positive for blurred vision.  Respiratory:  Positive for cough and shortness of breath. Negative for sputum production and wheezing.   Cardiovascular:  Negative for chest pain.  Neurological:  Positive for headaches.      Objective:     BP 128/84   Pulse (!) 53   Temp 98.1 F (36.7 C) (Temporal)   Ht 5\' 3"  (1.6 m)   Wt 159 lb 4 oz (72.2 kg)   LMP 03/28/2010   SpO2 100%   BMI 28.21 kg/m  BP Readings from Last 3 Encounters:  01/15/23 128/84  11/10/22 138/80  10/27/22 136/62   Wt Readings from Last 3 Encounters:  01/15/23 159 lb 4 oz (72.2 kg)  11/10/22 156 lb (70.8 kg)  10/27/22 157 lb 3.2 oz (71.3 kg)      Physical Exam Vitals reviewed.  Constitutional:      General: She is not in acute distress.    Appearance: Normal appearance.  HENT:     Head: Normocephalic and atraumatic.  Eyes:     General: Lids are normal. Vision grossly intact. Gaze aligned appropriately. No visual field deficit or scleral  icterus.       Right eye: No discharge.        Left eye: No discharge.     Extraocular Movements: Extraocular movements intact.     Conjunctiva/sclera: Conjunctivae normal.     Pupils: Pupils are equal, round, and reactive to light.     Visual Fields: Right eye visual fields normal and left eye visual fields normal.  Neck:     Vascular: No carotid bruit.  Cardiovascular:     Rate and Rhythm: Normal rate and regular rhythm.     Pulses: Normal pulses.     Heart sounds: Normal heart sounds.  Pulmonary:     Effort: Pulmonary effort is normal.     Breath sounds: Normal breath sounds.  Skin:    General: Skin is warm and dry.  Neurological:     General: No focal deficit present.     Mental Status: She is alert and oriented to person, place, and time.  Psychiatric:        Mood and Affect: Mood normal.        Behavior: Behavior normal.        Judgment: Judgment normal.      Results for orders placed or  performed in visit on 01/15/23  POC COVID-19 BinaxNow  Result Value Ref Range   SARS Coronavirus 2 Ag Negative Negative  POCT Influenza A/B  Result Value Ref Range   Influenza A, POC Negative Negative   Influenza B, POC Negative Negative  POCT respiratory syncytial virus  Result Value Ref Range   RSV Rapid Ag negative       The 10-year ASCVD risk score (Arnett DK, et al., 2019) is: 5.2%    Assessment & Plan:   Problem List Items Addressed This Visit       Respiratory   Sinusitis    Acute, improving Likely viral in origin POC Covid, RSV, flu are negative Manage symptoms with as needed tylenol, rest, hydration. Call office if symptoms persist past 14 days or if they worsen before then.        Relevant Orders   POC COVID-19 BinaxNow (Completed)   POCT Influenza A/B (Completed)   POCT respiratory syncytial virus (Completed)     Other   Visual changes - Primary    Acute Visual field intact on exam, pupils PERRLA, EOM intact. Etiology unclear possibly related to  her nasal congestion. Recommend that she see ophthalmologist if visual changes do not resolve with improvement in her sinus symptoms.       Relevant Orders   Ambulatory referral to Ophthalmology    Return if symptoms worsen or fail to improve.    Elenore Paddy, NP

## 2023-01-15 NOTE — Assessment & Plan Note (Addendum)
Acute, improving Likely viral in origin POC Covid, RSV, flu are negative Manage symptoms with as needed tylenol, rest, hydration. Call office if symptoms persist past 14 days or if they worsen before then.

## 2023-01-15 NOTE — Assessment & Plan Note (Signed)
Acute Visual field intact on exam, pupils PERRLA, EOM intact. Etiology unclear possibly related to her nasal congestion. Recommend that she see ophthalmologist if visual changes do not resolve with improvement in her sinus symptoms.

## 2023-01-19 ENCOUNTER — Encounter (INDEPENDENT_AMBULATORY_CARE_PROVIDER_SITE_OTHER): Payer: BC Managed Care – PPO | Admitting: Ophthalmology

## 2023-01-19 DIAGNOSIS — H43813 Vitreous degeneration, bilateral: Secondary | ICD-10-CM

## 2023-01-19 DIAGNOSIS — H35033 Hypertensive retinopathy, bilateral: Secondary | ICD-10-CM

## 2023-01-19 DIAGNOSIS — H35342 Macular cyst, hole, or pseudohole, left eye: Secondary | ICD-10-CM | POA: Diagnosis not present

## 2023-01-19 DIAGNOSIS — I10 Essential (primary) hypertension: Secondary | ICD-10-CM

## 2023-01-29 ENCOUNTER — Encounter (INDEPENDENT_AMBULATORY_CARE_PROVIDER_SITE_OTHER): Payer: BC Managed Care – PPO | Admitting: Ophthalmology

## 2023-01-29 DIAGNOSIS — H35033 Hypertensive retinopathy, bilateral: Secondary | ICD-10-CM

## 2023-01-29 DIAGNOSIS — I10 Essential (primary) hypertension: Secondary | ICD-10-CM

## 2023-01-29 DIAGNOSIS — H35342 Macular cyst, hole, or pseudohole, left eye: Secondary | ICD-10-CM

## 2023-01-29 DIAGNOSIS — H43813 Vitreous degeneration, bilateral: Secondary | ICD-10-CM

## 2023-01-29 NOTE — H&P (Signed)
Grace George is an 62 y.o. female.   Chief Complaint: rapid loss of vision left eye HPI: noted blurred vision 2 weeks ago in left eye  Past Medical History:  Diagnosis Date   Abnormal Pap smear    Allergic rhinitis    Allergy    Anemia    Anxiety    GERD (gastroesophageal reflux disease)    History of shingles 04/2013   History of stress test 06/2011   Abnormal Myocadial perfusion scan demomstrating an attenuation defect in the anterior region of the myocardium, No ischemia or infaract/scar is seenin the remaining myocardium. Excercise capacity 12 METS.   HTN (hypertension)    Hx of echocardiogram 2009   Showed normal systolic and diastolic function. she does have borderline mitral valve prolapse.   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   Post-operative nausea and vomiting     Past Surgical History:  Procedure Laterality Date   CESAREAN SECTION     twice   COLONOSCOPY     CRYOABLATION     times 2 for abn paps   ESOPHAGOGASTRODUODENOSCOPY     HYSTEROSCOPY     LIGAMENT REPAIR Right 05/24/2020   Procedure: COLLATERAL LIGAMENT REPAIR BOTH SIDES;  Surgeon: Bjorn Pippin, MD;  Location: [facility];  Service: Orthopedics;  Laterality: Right;   PATELLAR TENDON REPAIR Right 05/24/2020   Procedure: PATELLA TENDON REPAIR;  Surgeon: Bjorn Pippin, MD;  Location: [facility];  Service: Orthopedics;  Laterality: Right;   PATELLECTOMY Right 05/24/2020   Procedure: PATELLECTOMY;  Surgeon: Bjorn Pippin, MD;  Location: [facility];  Service: Orthopedics;  Laterality: Right;   WRIST SURGERY     right    Family History  Problem Relation Age of Onset   Breast cancer Mother    Heart disease Mother    Diabetes Mother    Hypertension Mother    Prostate cancer Father    Neuropathy Father    Congestive Heart  Failure Father    Diabetes Sister    Hypertension Sister    Heart disease Brother    Other Brother        CHF, had heart transplant   Social History:  reports that she has never smoked. She has never been exposed to tobacco smoke. She has never used smokeless tobacco. She reports that she does not drink alcohol and does not use drugs.  Allergies:  Allergies  Allergen Reactions   Egg-Derived Products     Causes a rash, if she eats a lot!   Sulfa Antibiotics     Rash & itching    No medications prior to admission.    Review of systems otherwise negative  Last menstrual period 03/28/2010.  Physical exam: Mental status: oriented x3. Eyes: See eye exam associated with this date of surgery in media tab.  Scanned in by scanning center Ears, Nose, Throat: within normal limits Neck: Within Normal limits General: within normal limits Chest: Within normal limits Breast: deferred Heart: Within normal limits Abdomen: Within normal limits GU: deferred Extremities: within normal limits Skin: within normal limits  Assessment/Plan Macular hole left eye Plan: To [facility] for Pars plana vitrectomy, membrane peel, laser, Internal limiting membrane peel, serum patch, gas injection left eye.  Sherrie George 01/29/2023, 3:37  PM

## 2023-02-02 ENCOUNTER — Telehealth: Payer: Self-pay | Admitting: *Deleted

## 2023-02-02 NOTE — Telephone Encounter (Signed)
   Pre-operative Risk Assessment    Patient Name: Grace George  DOB: [birth year] MRN: [id number]    DATE OF LAST VISIT: 10/27/22 Edd Fabian, FNP DATE OF NEXT VISIT: NONE  Request for Surgical Clearance    Procedure:   25 GAUGE PARS PLANA VITRECTOMY WITH 20 GAUGE MVR PORT FOR MACULAR HOLE-LEFT EYE  Date of Surgery:  Clearance 02/17/23                                 Surgeon:  DR. Jonny Ruiz MATTHEWS  Surgeon's Group or Practice Name:  [facility] Phone number:  [phone] Fax number:  [phone] ATTN: DR. MATTHEWS   Type of Clearance Requested:   - Medical ; NO MEDICATIONS LISTED AS NEEDING TO BE HELD   Type of Anesthesia:  General    Additional requests/questions:    Elpidio Anis   02/02/2023, 5:39 PM

## 2023-02-03 NOTE — Telephone Encounter (Signed)
   Name: Grace George  DOB: Feb 19, 1961  MRN: [id number]  Primary Cardiologist: Nicki Guadalajara, MD   Preoperative team, please contact this patient and set up a phone call appointment for further preoperative risk assessment. Please obtain consent and complete medication review. Thank you for your help.  Last seen by Edd Fabian 10/27/2022  I confirm that guidance regarding antiplatelet and oral anticoagulation therapy has been completed and, if necessary, noted below. Not on antiplatelet or anticoagulation therapy.    I also confirmed the patient resides in the state of [place]. As per [place] [employer] telemedicine laws, the patient must reside in the state in which the provider is licensed.   Joni Reining, NP 02/03/2023, 12:13 PM [facility]

## 2023-02-04 ENCOUNTER — Telehealth: Payer: Self-pay | Admitting: *Deleted

## 2023-02-04 ENCOUNTER — Encounter (INDEPENDENT_AMBULATORY_CARE_PROVIDER_SITE_OTHER): Payer: BC Managed Care – PPO | Admitting: Ophthalmology

## 2023-02-04 NOTE — Telephone Encounter (Signed)
Pt has been scheduled tele pre op appt 02/13/23. Ok per Bernadene Person, NP to add on due to procedure date and no open slots.    Med rec and consent are done.

## 2023-02-04 NOTE — Telephone Encounter (Signed)
Patient is returning call and is requesting return call.

## 2023-02-04 NOTE — Telephone Encounter (Signed)
Left message to call back to schedule a tele pre op appt. Ok per Bernadene Person, NP to add pt on due to procedure date. No available open slots.

## 2023-02-04 NOTE — Telephone Encounter (Signed)
Pt has been scheduled tele pre op appt 02/13/23. Ok per Bernadene Person, NP to add on due to procedure date and no open slots.   Med rec and consent are done.      Patient Consent for Virtual Visit        Grace George has provided verbal consent on 02/04/2023 for a virtual visit (video or telephone).   CONSENT FOR VIRTUAL VISIT FOR:  Grace George  By participating in this virtual visit I agree to the following:  I hereby voluntarily request, consent and authorize [facility] and its employed or contracted physicians, physician assistants, nurse practitioners or other licensed health care professionals (the Practitioner), to provide me with telemedicine health care services (the "Services") as deemed necessary by the treating Practitioner. I acknowledge and consent to receive the Services by the Practitioner via telemedicine. I understand that the telemedicine visit will involve communicating with the Practitioner through live audiovisual communication technology and the disclosure of certain medical information by electronic transmission. I acknowledge that I have been given the opportunity to request an in-person assessment or other available alternative prior to the telemedicine visit and am voluntarily participating in the telemedicine visit.  I understand that I have the right to withhold or withdraw my consent to the use of telemedicine in the course of my care at any time, without affecting my right to future care or treatment, and that the Practitioner or I may terminate the telemedicine visit at any time. I understand that I have the right to inspect all information obtained and/or recorded in the course of the telemedicine visit and may receive copies of available information for a reasonable fee.  I understand that some of the potential risks of receiving the Services via telemedicine include:  Delay or interruption in medical evaluation due to technological equipment failure  or disruption; Information transmitted may not be sufficient (e.g. poor resolution of images) to allow for appropriate medical decision making by the Practitioner; and/or  In rare instances, security protocols could fail, causing a breach of personal health information.  Furthermore, I acknowledge that it is my responsibility to provide information about my medical history, conditions and care that is complete and accurate to the best of my ability. I acknowledge that Practitioner's advice, recommendations, and/or decision may be based on factors not within their control, such as incomplete or inaccurate data provided by me or distortions of diagnostic images or specimens that may result from electronic transmissions. I understand that the practice of medicine is not an exact science and that Practitioner makes no warranties or guarantees regarding treatment outcomes. I acknowledge that a copy of this consent can be made available to me via my patient portal [facility] MyChart), or I can request a printed copy by calling the office of [facility].    I understand that my insurance will be billed for this visit.   I have read or had this consent read to me. I understand the contents of this consent, which adequately explains the benefits and risks of the Services being provided via telemedicine.  I have been provided ample opportunity to ask questions regarding this consent and the Services and have had my questions answered to my satisfaction. I give my informed consent for the services to be provided through the use of telemedicine in my medical care

## 2023-02-09 NOTE — Telephone Encounter (Signed)
Patient was calling back with some questions. Please advise

## 2023-02-09 NOTE — Telephone Encounter (Signed)
I s/w the pt and she said she got an alert on her watch that her HR was 35. She would like to changer her tele appt 02/13/23 to in office, sooner. I did not have anything sooner at [facility], [facility] or [facility] location. Pt is agreeable to 02/13/23 @ 9:15 with Joni Reining, DNP. I cancelled the tele appt for 02/13/23. Pt thanked me for the help. I will add to wait list as well.   I will also send a message to [facility] triage as to reach to the pt for further assessment.

## 2023-02-10 NOTE — Progress Notes (Unsigned)
Cardiology Office Note:  .   Date:  02/12/2023  ID:  Grace George, DOB January 18, 1961, MRN [id number] PCP: Myrlene Broker, MD  [facility] Providers Cardiologist  Dr. Tresa Endo    History of Present Illness: Grace George    Grace George is a 62 y.o. female with history of hyperlipidemia, hypertension, palpitations.  She is on propranolol as needed for rapid heart rhythm.  She remains very active walking a minimum of 30 minutes a day.  She is here for preoperative cardiac evaluation to have 25-gauge pars plana vitrectomy with 20-gauge MVR port for macular hole-left eye by Dr. Alan Mulder with [facility] 02/17/2023.  She is very anxious and comes today worried about her heart rate being low.  She states that she did not sleep well last night because her watch was going off telling her that her heart rate was too low.  She denies chest pain, she has had occasional skipped beats but denies any rapid heart rhythm.  She stopped taking propranolol as she thought this was causing her heart rate to be too low.  ROS: Positive for anxiety, occasional palpitations, negative for chest pain, shortness of breath, dizziness, or fatigue  Studies Reviewed: Grace George   EKG Interpretation Date/Time:  Thursday February 12 2023 13:36:18 EDT Ventricular Rate:  88 PR Interval:  190 QRS Duration:  70 QT Interval:  368 QTC Calculation: 445 R Axis:   35  Text Interpretation: Sinus rhythm with marked sinus arrhythmia with occasional Premature ventricular complexes When compared with ECG of 27-Oct-2022 11:18, Premature ventricular complexes are now Present Premature supraventricular complexes are no longer Present Confirmed by Joni Reining [phone]) on 02/12/2023 1:43:07 PM    EKG Interpretation Date/Time:  Thursday February 12 2023 13:36:18 EDT Ventricular Rate:  88 PR Interval:  190 QRS Duration:  70 QT Interval:  368 QTC Calculation: 445 R Axis:   35  Text Interpretation: Sinus  rhythm with marked sinus arrhythmia with occasional Premature ventricular complexes When compared with ECG of 27-Oct-2022 11:18, Premature ventricular complexes are now Present Premature supraventricular complexes are no longer Present Confirmed by Joni Reining [phone]) on 02/12/2023 1:43:07 PM    Physical Exam:   VS:  BP 124/72   Pulse (!) 45   Ht 5\' 3"  (1.6 m)   Wt 154 lb 12.8 oz (70.2 kg)   LMP 03/28/2010   SpO2 99%   BMI 27.42 kg/m    Wt Readings from Last 3 Encounters:  02/12/23 154 lb 12.8 oz (70.2 kg)  01/15/23 159 lb 4 oz (72.2 kg)  11/10/22 156 lb (70.8 kg)    GEN: Well nourished, well developed in no acute distress NECK: No JVD; No carotid bruits CARDIAC: RRR, tachycardic, no murmurs, rubs, gallops RESPIRATORY:  Clear to auscultation without rales, wheezing or rhonchi  ABDOMEN: Soft, non-tender, non-distended EXTREMITIES:  No edema; No deformity  Psych: Extremely anxious, tremulous, easily tearful.  ASSESSMENT AND PLAN: .   Pre-Operative Cardiac Evaluation: Due to frequent ectopy, short pauses post PACs, frequent PVCs on EKG, I will place a ZIO monitor for further evaluation of heart rate and rhythm.  The patient will not have clearance today as she has frequent ventricular ectopy and short pauses which may impede anesthesia therapy during surgery.  Once patient has been fully evaluated due to her arrhythmias will be able to clear her, and provide medication recommendations.     2.  Hypertension: Blood pressure is well-controlled today.  No changes in  her regimen.  3.  Tachypalpitations: May take propranolol for rapid heart rhythm as directed.    Dispo: Follow-up after ZIO monitor.  Signed, Bettey Mare. Liborio Nixon, ANP, AACC

## 2023-02-10 NOTE — Telephone Encounter (Addendum)
I called and spoke with patient and she stated her iphone watch let her know her HR was 39 while she was asleep.   Today she feels fine and heart rate is at 60. Has app appointment on Thursday. ED precautions discussed

## 2023-02-11 ENCOUNTER — Telehealth: Payer: Self-pay | Admitting: Cardiovascular Disease

## 2023-02-11 NOTE — Telephone Encounter (Signed)
Called and spoke to patient who reports her heart rate has been running between 38-44 at times according to her Apple watch. Her current heart rate is 56. She did not take her Propranolol yesterday and her heart rate was in the 50's. She did resume taking it today but did not check her heart rate prior taking her Propranolol. She does report some dizziness and having some fatigue but no other symptoms. She is schedule for an office visit tomorrow.and was advised to check HR before taking Propranolol and hold if <50. Also advised if she gets worse, have chest pain, shortness of breath or feel like she is going to pass out she should been seen in the ER. Pt verbalized understanding and agree.

## 2023-02-11 NOTE — Telephone Encounter (Signed)
Pt states her heart rate is still dropping and would like to speak with the nurse about this. Please advise

## 2023-02-12 ENCOUNTER — Encounter: Payer: Self-pay | Admitting: Adult Health

## 2023-02-12 ENCOUNTER — Ambulatory Visit: Payer: BC Managed Care – PPO | Attending: Adult Health

## 2023-02-12 ENCOUNTER — Ambulatory Visit: Payer: BC Managed Care – PPO | Attending: Adult Health | Admitting: Adult Health

## 2023-02-12 ENCOUNTER — Other Ambulatory Visit: Payer: Self-pay | Admitting: Adult Health

## 2023-02-12 VITALS — BP 124/72 | HR 45 | Ht 63.0 in | Wt 154.8 lb

## 2023-02-12 DIAGNOSIS — I493 Ventricular premature depolarization: Secondary | ICD-10-CM | POA: Diagnosis not present

## 2023-02-12 DIAGNOSIS — Z01818 Encounter for other preprocedural examination: Secondary | ICD-10-CM

## 2023-02-12 NOTE — Progress Notes (Unsigned)
Enrolled for [employer] to mail a ZIO XT long term holter monitor to the patients address on file.   Dr. Tresa Endo to read.

## 2023-02-12 NOTE — Patient Instructions (Signed)
Medication Instructions:  No Changes *If you need a refill on your cardiac medications before your next appointment, please call your pharmacy*   Lab Work: BMET, Magnesium, TSH, Vitamin D If you have labs (blood work) drawn today and your tests are completely normal, you will receive your results only by: MyChart Message (if you have MyChart) OR A paper copy in the mail If you have any lab test that is abnormal or we need to change your treatment, we will call you to review the results.   Testing/Procedures: ZIO AT Long term monitor-Live Telemetry  Your physician has requested you wear a ZIO patch monitor for 7 days.  This is a single patch monitor. [employer] supplies one patch monitor per enrollment. Additional  stickers are not available.  Please do not apply patch if you will be having a Nuclear Stress Test, Echocardiogram, Cardiac CT, MRI,  or Chest Xray during the period you would be wearing the monitor. The patch cannot be worn during  these tests. You cannot remove and re-apply the ZIO AT patch monitor.  Your ZIO patch monitor will be mailed 3 day USPS to your address on file. It may take 3-5 days to  receive your monitor after you have been enrolled.  Once you have received your monitor, please review the enclosed instructions. Your monitor has  already been registered assigning a specific monitor serial # to you.   Billing and Patient Assistance Program information  Meredeth Ide has been supplied with any insurance information on record for billing. [employer] offers a sliding scale Patient Assistance Program for patients without insurance, or whose  insurance does not completely cover the cost of the ZIO patch monitor. You must apply for the  Patient Assistance Program to qualify for the discounted rate. To apply, call [employer] at [phone],  select option 4, select option 2 , ask to apply for the Patient Assistance Program, (you can request an  interpreter if needed). [employer]  will ask your household income and how many people are in your  household. [employer] will quote your out-of-pocket cost based on this information. They will also be able  to set up a 12 month interest free payment plan if needed.  Applying the monitor   Shave hair from upper left chest.  Hold the abrader disc by orange tab. Rub the abrader in 40 strokes over left upper chest as indicated in  your monitor instructions.  Clean area with 4 enclosed alcohol pads. Use all pads to ensure the area is cleaned thoroughly. Let  dry.  Apply patch as indicated in monitor instructions. Patch will be placed under collarbone on left side of  chest with arrow pointing upward.  Rub patch adhesive wings for 2 minutes. Remove the white label marked "1". Remove the white label  marked "2". Rub patch adhesive wings for 2 additional minutes.  While looking in a mirror, press and release button in center of patch. A small green light will flash 3-4  times. This will be your only indicator that the monitor has been turned on.  Do not shower for the first 24 hours. You may shower after the first 24 hours.  Press the button if you feel a symptom. You will hear a small click. Record Date, Time and Symptom in  the Patient Log.   Starting the Gateway  In your kit there is a Audiological scientist box the size of a cellphone. This is Buyer, retail. It transmits all your  recorded data to [facility].  This box must always stay within 10 feet of you. Open the box and push the *  button. There will be a light that blinks orange and then green a few times. When the light stops  blinking, the Gateway is connected to the ZIO patch. Call [employer] at [phone] to confirm your monitor is transmitting.  Returning your monitor  Remove your patch and place it inside the Gateway. In the lower half of the Gateway there is a white  bag with prepaid postage on it. Place Gateway in bag and seal. Mail package back to [place] as soon as   possible. Your physician should have your final report approximately 7 days after you have mailed back  your monitor. Call [facility] Customer Care at [phone] if you have questions regarding your ZIO AT  patch monitor. Call them immediately if you see an orange light blinking on your monitor.  If your monitor falls off in less than 4 days, contact our Monitor department at [phone]. If your  monitor becomes loose or falls off after 4 days call [employer] at [phone] for suggestions on  securing your monitor    Follow-Up: At [facility], you and your health needs are our priority.  As part of our continuing mission to provide you with exceptional heart care, we have created designated Provider Care Teams.  These Care Teams include your primary Cardiologist (physician) and Advanced Practice Providers (APPs -  Physician Assistants and Nurse Practitioners) who all work together to provide you with the care you need, when you need it.  We recommend signing up for the patient portal called "MyChart".  Sign up information is provided on this After Visit Summary.  MyChart is used to connect with patients for Virtual Visits (Telemedicine).  Patients are able to view lab/test results, encounter notes, upcoming appointments, etc.  Non-urgent messages can be sent to your provider as well.   To learn more about what you can do with MyChart, go to [email or web address].    Your next appointment:   1 month(s)  Provider:   Joni Reining, DNP, ANP

## 2023-02-13 ENCOUNTER — Ambulatory Visit: Payer: BC Managed Care – PPO | Admitting: Adult Health

## 2023-02-13 ENCOUNTER — Telehealth: Payer: Self-pay

## 2023-02-13 ENCOUNTER — Ambulatory Visit: Payer: BC Managed Care – PPO

## 2023-02-13 LAB — BASIC METABOLIC PANEL
BUN/Creatinine Ratio: 13 (ref 12–28)
BUN: 10 mg/dL (ref 8–27)
CO2: 22 mmol/L (ref 20–29)
Calcium: 10.2 mg/dL (ref 8.7–10.3)
Chloride: 102 mmol/L (ref 96–106)
Creatinine, Ser: 0.78 mg/dL (ref 0.57–1.00)
Glucose: 118 mg/dL — ABNORMAL HIGH (ref 70–99)
Potassium: 4.5 mmol/L (ref 3.5–5.2)
Sodium: 140 mmol/L (ref 134–144)
eGFR: 85 mL/min/{1.73_m2} (ref >59)

## 2023-02-13 LAB — VITAMIN D 25 HYDROXY (VIT D DEFICIENCY, FRACTURES): Vit D, 25-Hydroxy: 84.2 ng/mL (ref 30.0–100.0)

## 2023-02-13 LAB — TSH: TSH: 3.86 u[IU]/mL (ref 0.450–4.500)

## 2023-02-13 LAB — MAGNESIUM: Magnesium: 2.1 mg/dL (ref 1.6–2.3)

## 2023-02-13 NOTE — Telephone Encounter (Addendum)
Results viewed by patient via MyChart.----- Message from Joni Reining sent at 02/13/2023  6:46 AM EDT ----- I have reviewed the labs. All are normal. If she is feeling more palpitations, skipping HR or racing please begin propanolol daily again. (She has stopped taking it because her watch kept alarming that her HR was too low)  I left her off of it for now, but she can start back if the symptoms are worse. Await cardiac monitor before clearing for surgery.   KL

## 2023-02-13 NOTE — Telephone Encounter (Signed)
Spoke to patient she will be receiving monitor tomorrow.She is suppose to wear for 1 week.She wanted a sooner appointment.Advised to keep appointment as planned with Joni Reining DNP 11/19 at 9:15 am.Advised after she mails monitor back it takes appox 2 weeks to get monitor results.

## 2023-02-14 DIAGNOSIS — I493 Ventricular premature depolarization: Secondary | ICD-10-CM

## 2023-02-15 NOTE — Telephone Encounter (Signed)
Message acknowledged

## 2023-02-16 ENCOUNTER — Encounter (HOSPITAL_COMMUNITY): Payer: Self-pay | Admitting: Certified Registered"

## 2023-02-17 ENCOUNTER — Encounter (HOSPITAL_COMMUNITY): Admission: RE | Payer: Self-pay | Source: Home / Self Care

## 2023-02-17 ENCOUNTER — Ambulatory Visit (HOSPITAL_COMMUNITY): Admission: RE | Admit: 2023-02-17 | Payer: BC Managed Care – PPO | Source: Home / Self Care | Admitting: Ophthalmology

## 2023-02-17 DIAGNOSIS — H35342 Macular cyst, hole, or pseudohole, left eye: Secondary | ICD-10-CM

## 2023-02-17 SURGERY — 25 GAUGE PARS PLANA VITRECTOMY WITH 20 GAUGE MVR PORT FOR MACULAR HOLE
Anesthesia: General | Laterality: Left

## 2023-02-24 ENCOUNTER — Encounter (INDEPENDENT_AMBULATORY_CARE_PROVIDER_SITE_OTHER): Payer: BC Managed Care – PPO | Admitting: Ophthalmology

## 2023-03-15 NOTE — Progress Notes (Unsigned)
Cardiology Office Note:  .   Date:  03/17/2023  ID:  Grace George, DOB 18-Sep-1960, MRN [id number] PCP: Myrlene Broker, MD  [facility] Providers Cardiologist: Nicki Guadalajara, MD { History of Present Illness: [name]   Grace George is a 62 y.o. female  with history of hyperlipidemia, hypertension, palpitations.  She is on propranolol as needed for rapid heart rhythm.  Last seen in the office on 02/12/2023 for preoperative evaluation.  I ordered a ZIO monitor to evaluate for her complaints of frequent palpitations as EKG revealed frequent ventricular ectopy short pauses post PAC.   ZIO monitor revealed a minimum heart rate of 41 bpm with a maximum heart rate of 218 bpm with an average heart rate of 66 bpm.  She had 220 episodes of supraventricular tachycardia, with 16% burden.  There were no pauses.  She had some ventricular bigeminy and trigeminy.   She comes today very anxious about the ZIO monitor results.  She is concerned that this is going to delay her surgery.  She occasionally has discomfort in her chest when she feels her heart rate irregular or racing.  She is not taking propranolol.  ROS: As above otherwise negative  Studies Reviewed: [name]   EKG Interpretation Date/Time:  Tuesday March 17 2023 09:39:56 EST Ventricular Rate:  84 PR Interval:    QRS Duration:  66 QT Interval:  376 QTC Calculation: 444 R Axis:   16  Text Interpretation: Sinus rhythm with frequnt PAC's and PVC's. T wave abnormality, consider anterior ischemia When compared with ECG of 12-Feb-2023 13:36, Current undetermined rhythm precludes rhythm comparison, needs review T wave inversion more evident in Anterior leads Confirmed by Joni Reining [phone]) on 03/17/2023 9:47:28 AM    Zio Monitor 03/04/2023 Patient had a min HR of 41 bpm, max HR of 218 bpm, and avg HR of 66 bpm. Predominant underlying rhythm was Sinus Rhythm. Slight P wave morphology changes were noted. 220 Supraventricular Tachycardia  runs occurred, the run with the fastest interval  lasting 5 beats with a max rate of 218 bpm, the longest lasting 10 beats with an avg rate of 176 bpm. Supraventricular Tachycardia was detected within +/- 45 seconds of symptomatic patient event(s). Isolated SVEs were frequent (16.1%, [id number]), SVE  Couplets were frequent (6.9%, 24044), and SVE Triplets were occasional (3.3%, 7683). Isolated VEs were occasional (1.8%, 12429), VE Couplets were rare (<1.0%, 151), and VE Triplets were rare (<1.0%, 19). Ventricular Bigeminy and Trigeminy were present.    Physical Exam:   VS:  BP 132/80 (BP Location: Left Arm, Patient Position: Sitting, Cuff Size: Normal)   Pulse (!) 53   Ht 5\' 3"  (1.6 m)   Wt 154 lb 3.2 oz (69.9 kg)   LMP 03/28/2010   SpO2 99%   BMI 27.32 kg/m    Wt Readings from Last 3 Encounters:  03/17/23 154 lb 3.2 oz (69.9 kg)  02/12/23 154 lb 12.8 oz (70.2 kg)  01/15/23 159 lb 4 oz (72.2 kg)    GEN: Well nourished, well developed in no acute distress, anxious NECK: No JVD; No carotid bruits CARDIAC: IRRR, no murmurs, rubs, gallops RESPIRATORY:  Clear to auscultation without rales, wheezing or rhonchi  ABDOMEN: Soft, non-tender, non-distended EXTREMITIES:  No edema; No deformity   ASSESSMENT AND PLAN: .   Palpitations: ZIO monitor revealing frequent PACs, frequent PVCs, ventricular bigeminy and trigeminy.  I will start her on low-dose metoprolol 12.5 mg daily to help with this.  I will also  send her to EP for further evaluation and management due to high burden of SVT and frequent ectopy.  The patient is quite anxious about her diagnosis.  Reassurance is given.  I am going to see her in 2 weeks to see how well she tolerates the metoprolol.  If she tolerates this and heart rate with arrhythmias is improved she will be able to move forward with her surgery.  2.  Hypertension: Blood pressure is low normal today.  Likely due to irregular heart rate therefore accuracy may not be correct.  She  denies any dizziness or fatigue currently.  3.  Hypercholesterolemia: Patient remains on simvastatin 40 mg daily.  Goal of LDL less than 100.         Signed, Bettey Mare. Liborio Nixon, ANP, AACC

## 2023-03-17 ENCOUNTER — Encounter: Payer: Self-pay | Admitting: Adult Health

## 2023-03-17 ENCOUNTER — Ambulatory Visit: Payer: BC Managed Care – PPO | Attending: Adult Health | Admitting: Adult Health

## 2023-03-17 VITALS — BP 132/80 | HR 53 | Ht 63.0 in | Wt 154.2 lb

## 2023-03-17 DIAGNOSIS — R002 Palpitations: Secondary | ICD-10-CM | POA: Diagnosis not present

## 2023-03-17 DIAGNOSIS — I10 Essential (primary) hypertension: Secondary | ICD-10-CM

## 2023-03-17 DIAGNOSIS — E7800 Pure hypercholesterolemia, unspecified: Secondary | ICD-10-CM

## 2023-03-17 DIAGNOSIS — I471 Supraventricular tachycardia, unspecified: Secondary | ICD-10-CM | POA: Diagnosis not present

## 2023-03-17 MED ORDER — METOPROLOL SUCCINATE ER 25 MG PO TB24: 12.5000 mg | ORAL_TABLET | Freq: Every day | ORAL | 1 refills | Status: DC

## 2023-03-17 NOTE — Patient Instructions (Signed)
Medication Instructions:  Start Metoprolol Succinate 12.5 mg ( Take 1/2 of 25 mg Tablet Daily). *If you need a refill on your cardiac medications before your next appointment, please call your pharmacy*   Lab Work: No labs If you have labs (blood work) drawn today and your tests are completely normal, you will receive your results only by: MyChart Message (if you have MyChart) OR A paper copy in the mail If you have any lab test that is abnormal or we need to change your treatment, we will call you to review the results.   Testing/Procedures: No Testing   Follow-Up: At [facility], you and your health needs are our priority.  As part of our continuing mission to provide you with exceptional heart care, we have created designated Provider Care Teams.  These Care Teams include your primary Cardiologist (physician) and Advanced Practice Providers (APPs -  Physician Assistants and Nurse Practitioners) who all work together to provide you with the care you need, when you need it.  We recommend signing up for the patient portal called "MyChart".  Sign up information is provided on this After Visit Summary.  MyChart is used to connect with patients for Virtual Visits (Telemedicine).  Patients are able to view lab/test results, encounter notes, upcoming appointments, etc.  Non-urgent messages can be sent to your provider as well.   To learn more about what you can do with MyChart, go to [email or web address].    Your next appointment:   2 week(s)  Provider:   Joni Reining, DNP, ANP

## 2023-03-29 NOTE — Progress Notes (Unsigned)
Cardiology Office Note:  .   Date:  03/31/2023  ID:  [name], DOB Sep 21, 1960, MRN [id number] PCP: Myrlene Broker, MD  [facility] Providers Cardiologist:  Dr. Tresa Endo  History of Present Illness: [name]   Grace George is a 62 y.o. female with history of hyperlipidemia, hypertension, palpitations. She is on propranolol as needed for rapid heart rhythm. ZIO monitor revealed a minimum heart rate of 41 bpm with a maximum heart rate of 218 bpm with an average heart rate of 66 bpm.  She had 220 episodes of supraventricular tachycardia, with 16% burden.    She was referred to EP due to high burden of ventricular ectopy, ventricular bigeminy trigeminy and SVT.  She was started on metoprolol 12.5 mg daily.  She was to be cleared from a cardiac standpoint to have a pars plana vitrectomy with 20-gauge MVR port for macular hole left eye.  If she tolerated the metoprolol would allow her to move forward for her surgery.  She comes today feeling much better on low-dose metoprolol.  She denies any rapid heart rhythms, or palpitations.  In fact she states she is cut down on use of Xanax as her heart has been, so much more normal.  She denies any fatigue or dizziness on metoprolol.  ROS: As above otherwise negative.  Studies Reviewed: [name]   Zio Monitor 02/12/2023 Patient had a min HR of 41 bpm, max HR of 218 bpm, and avg HR of 66 bpm. Predominant underlying rhythm was Sinus Rhythm. Slight P wave morphology changes were noted. 220 Supraventricular Tachycardia runs occurred, the run with the fastest interval  lasting 5 beats with a max rate of 218 bpm, the longest lasting 10 beats with an avg rate of 176 bpm. Supraventricular Tachycardia was detected within +/- 45 seconds of symptomatic patient event(s). Isolated SVEs were frequent (16.1%, [id number]), SVE  Couplets were frequent (6.9%, 24044), and SVE Triplets were occasional (3.3%, 7683). Isolated VEs were occasional (1.8%, 12429), VE Couplets were  rare (<1.0%, 151), and VE Triplets were rare (<1.0%, 19). Ventricular Bigeminy and Trigeminy were present.  EKG Interpretation Date/Time:  Tuesday March 31 2023 08:27:55 EST Ventricular Rate:  80 PR Interval:  198 QRS Duration:  72 QT Interval:  382 QTC Calculation: 440 R Axis:   27  Text Interpretation: Sinus rhythm with Premature atrial complexes When compared with ECG of 17-Mar-2023 09:39, Previous ECG has undetermined rhythm, needs review T wave inversion less evident in Anterior leads Confirmed by Joni Reining [phone]) on 03/31/2023 8:35:35 AM    Physical Exam:   VS:  BP (!) 152/70 (BP Location: Right Arm, Patient Position: Sitting, Cuff Size: Normal)   Pulse 80   Ht 5\' 3"  (1.6 m)   Wt 155 lb 3.2 oz (70.4 kg)   LMP 03/28/2010   SpO2 99%   BMI 27.49 kg/m    Wt Readings from Last 3 Encounters:  03/31/23 155 lb 3.2 oz (70.4 kg)  03/17/23 154 lb 3.2 oz (69.9 kg)  02/12/23 154 lb 12.8 oz (70.2 kg)    GEN: Well nourished, well developed in no acute distress NECK: No JVD; No carotid bruits CARDIAC: IRRR, no murmurs, rubs, gallops RESPIRATORY:  Clear to auscultation without rales, wheezing or rhonchi  ABDOMEN: Soft, non-tender, non-distended EXTREMITIES:  No edema; No deformity   ASSESSMENT AND PLAN: .    1.Pre-Operative Evaluation:  According to the Revised Cardiac Risk Index (RCRI), her Perioperative Risk of Major Cardiac Event is (%): 0.4  Her Functional Capacity in METs is: 9.89 according to the Duke Activity Status Index (DASI).   Therefore, based on ACC/AHA guidelines, patient would be at acceptable risk for the planned procedure without further cardiovascular testing. I will route this recommendation to the requesting party via Epic fax function.     2. PSVT: Marked improvement with low-dose metoprolol 12.5 mg daily.  She is feeling much better having less symptoms and is actually decreased use of Xanax as she is feeling much calmer and heart rate has been so  much better for her.  Will continue this and give her refills.  3.  Hypercholesterolemia: She will need to have follow-up labs on next office visit.  Continue statin therapy as directed.  4.White coat hypertension: I did recheck her BP and it  remained elevated. She is to keep up with this at home. IF remains elevated away from the office, she will report this to [name].    She will need to be assigned to different cardiologist with pending retirement of Dr. Tresa Endo.  I will assign her to Dr. Darryl Nestle she can see him in 1 year.   Signed, Bettey Mare. Liborio Nixon, ANP, AACC

## 2023-03-31 ENCOUNTER — Ambulatory Visit: Payer: BC Managed Care – PPO | Attending: Adult Health | Admitting: Adult Health

## 2023-03-31 ENCOUNTER — Encounter: Payer: Self-pay | Admitting: Adult Health

## 2023-03-31 VITALS — BP 152/70 | HR 80 | Ht 63.0 in | Wt 155.2 lb

## 2023-03-31 DIAGNOSIS — E7800 Pure hypercholesterolemia, unspecified: Secondary | ICD-10-CM | POA: Diagnosis not present

## 2023-03-31 DIAGNOSIS — I10 Essential (primary) hypertension: Secondary | ICD-10-CM | POA: Diagnosis not present

## 2023-03-31 DIAGNOSIS — I471 Supraventricular tachycardia, unspecified: Secondary | ICD-10-CM

## 2023-03-31 DIAGNOSIS — Z01818 Encounter for other preprocedural examination: Secondary | ICD-10-CM | POA: Diagnosis not present

## 2023-03-31 MED ORDER — METOPROLOL SUCCINATE ER 25 MG PO TB24: 12.5000 mg | ORAL_TABLET | Freq: Every day | ORAL | 3 refills | Status: DC

## 2023-03-31 NOTE — H&P (Signed)
Grace George is an 62 y.o. female.   Chief Complaint:Vision loss left eye for two months HPI: Macular hole patient booked for October and was cancelled by cardiology.  Now cardiology has cleared her for surgery   Past Medical History:  Diagnosis Date   Abnormal Pap smear    Allergic rhinitis    Allergy    Anemia    Anxiety    GERD (gastroesophageal reflux disease)    History of shingles 04/2013   History of stress test 06/2011   Abnormal Myocadial perfusion scan demomstrating an attenuation defect in the anterior region of the myocardium, No ischemia or infaract/scar is seenin the remaining myocardium. Excercise capacity 12 METS.   HTN (hypertension)    Hx of echocardiogram 2009   Showed normal systolic and diastolic function. she does have borderline mitral valve prolapse.   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   Post-operative nausea and vomiting     Past Surgical History:  Procedure Laterality Date   CESAREAN SECTION     twice   COLONOSCOPY     CRYOABLATION     times 2 for abn paps   ESOPHAGOGASTRODUODENOSCOPY     HYSTEROSCOPY     LIGAMENT REPAIR Right 05/24/2020   Procedure: COLLATERAL LIGAMENT REPAIR BOTH SIDES;  Surgeon: Bjorn Pippin, MD;  Location: [facility];  Service: Orthopedics;  Laterality: Right;   PATELLAR TENDON REPAIR Right 05/24/2020   Procedure: PATELLA TENDON REPAIR;  Surgeon: Bjorn Pippin, MD;  Location: [facility];  Service: Orthopedics;  Laterality: Right;   PATELLECTOMY Right 05/24/2020   Procedure: PATELLECTOMY;  Surgeon: Bjorn Pippin, MD;  Location:  SURGERY CENTER;  Service: Orthopedics;  Laterality: Right;   WRIST SURGERY     right    Family History  Problem Relation Age of Onset   Breast cancer Mother    Heart disease Mother    Diabetes Mother    Hypertension  Mother    Prostate cancer Father    Neuropathy Father    Congestive Heart Failure Father    Diabetes Sister    Hypertension Sister    Heart disease Brother    Other Brother        CHF, had heart transplant   Social History:  reports that she has never smoked. She has never been exposed to tobacco smoke. She has never used smokeless tobacco. She reports that she does not drink alcohol and does not use drugs.  Allergies:  Allergies  Allergen Reactions   Egg-Derived Products Rash    Causes a rash, if she eats a lot!   Sulfa Antibiotics Itching and Rash    No medications prior to admission.    Review of systems otherwise negative  Last menstrual period 03/28/2010.  Physical exam: Mental status: oriented x3. Eyes: See eye exam associated with this date of surgery in media tab.  Scanned in by scanning center Ears, Nose, Throat: within normal limits Neck: Within Normal limits General: within normal limits Chest: Within normal limits Breast: deferred Heart: Within normal limits Abdomen: Within normal limits GU: deferred Extremities: within normal limits Skin: within normal limits  Assessment/Plan Full thickness macular hole OS Plan: To [facility] for Pars plana vitrectomy, membrane peel, serum patch, laser, gas injection left eye.  Jonny Ruiz  D Rowyn Mustapha 03/31/2023, 12:40 PM

## 2023-03-31 NOTE — Patient Instructions (Signed)
Medication Instructions:  No Changes *If you need a refill on your cardiac medications before your next appointment, please call your pharmacy*   Lab Work: No labs If you have labs (blood work) drawn today and your tests are completely normal, you will receive your results only by: MyChart Message (if you have MyChart) OR A paper copy in the mail If you have any lab test that is abnormal or we need to change your treatment, we will call you to review the results.   Testing/Procedures: No Testing   Follow-Up: At [facility], you and your health needs are our priority.  As part of our continuing mission to provide you with exceptional heart care, we have created designated Provider Care Teams.  These Care Teams include your primary Cardiologist (physician) and Advanced Practice Providers (APPs -  Physician Assistants and Nurse Practitioners) who all work together to provide you with the care you need, when you need it.  We recommend signing up for the patient portal called "MyChart".  Sign up information is provided on this After Visit Summary.  MyChart is used to connect with patients for Virtual Visits (Telemedicine).  Patients are able to view lab/test results, encounter notes, upcoming appointments, etc.  Non-urgent messages can be sent to your provider as well.   To learn more about what you can do with MyChart, go to [email or web address].    Your next appointment:   3-4 month(s)  Provider:   Epifanio Lesches, MD

## 2023-04-09 ENCOUNTER — Telehealth: Payer: Self-pay

## 2023-04-09 ENCOUNTER — Ambulatory Visit: Payer: BC Managed Care – PPO | Admitting: Cardiovascular Disease

## 2023-04-09 NOTE — Telephone Encounter (Addendum)
Results viewed by patient via MYChart----- Message from Joni Reining sent at 04/07/2023 11:05 AM EST ----- Read Zio Monitor Report. She is having a lot of fast heart rates that happening and will need to be placed on a low dose medication to control this. On average, her HR was 66 bpm, so we will have to be careful with what we chose, Will discuss this on follow up.

## 2023-04-10 ENCOUNTER — Other Ambulatory Visit: Payer: Self-pay

## 2023-04-10 ENCOUNTER — Encounter (HOSPITAL_COMMUNITY): Payer: Self-pay | Admitting: Ophthalmology

## 2023-04-10 NOTE — Progress Notes (Signed)
PCP - Myrlene Broker, MD  Cardiologist - Jodelle Gross, NP Dr. Tresa Endo   PPM/ICD - denies Device Orders - n/a Rep Notified - n/a  Chest x-ray - 06-17-21 EKG - 03-31-23 Stress Test - 06-2011 ECHO - 10-03-15 Cardiac Cath - denies  CPAP - denies  DM denies  Blood Thinner Instructions: denies Aspirin Instructions: n/a  ERAS Protcol - NPO  COVID TEST- n/a  Anesthesia review: yes hx of HTN, Palpitations, in October she had a Zio monitor.  Patient verbally denies any shortness of breath, fever, cough and chest pain during phone call   -------------  SDW INSTRUCTIONS given:  Your procedure is scheduled on April 14, 2023.  Report to [facility] Main Entrance "A" at 9:00 A.M., and check in at the Admitting office.  Call this number if you have problems the morning of surgery:  [phone]   Remember:  Do not eat or drink  after midnight the night before your surgery      Take these medicines the morning of surgery with A SIP OF WATER  metoprolol succinate (TOPROL XL)  pantoprazole (PROTONIX)  IF NEEDED ALPRAZolam (XANAX)  levocetirizine (XYZAL)  sodium chloride (OCEAN) nasal spray    As  of today, STOP taking any Aspirin (unless otherwise instructed by your surgeon) Aleve, Naproxen, Ibuprofen, Motrin, Advil, Goody's, BC's, all herbal medications, fish oil, and all vitamins.                      Do not wear jewelry, make up, or nail polish            Do not wear lotions, powders, perfumes/colognes, or deodorant.            Do not shave 48 hours prior to surgery.  Men may shave face and neck.            Do not bring valuables to the hospital.            [facility] is not responsible for any belongings or valuables.  Do NOT Smoke (Tobacco/Vaping) 24 hours prior to your procedure If you use a CPAP at night, you may bring all equipment for your overnight stay.   Contacts, glasses, dentures or bridgework may not be worn into surgery.      For patients  admitted to the hospital, discharge time will be determined by your treatment team.   Patients discharged the day of surgery will not be allowed to drive home, and someone needs to stay with them for 24 hours.    Special instructions:   [place]- Preparing For Surgery  Before surgery, you can play an important role. Because skin is not sterile, your skin needs to be as free of germs as possible. You can reduce the number of germs on your skin by washing with CHG (chlorahexidine gluconate) Soap before surgery.  CHG is an antiseptic cleaner which kills germs and bonds with the skin to continue killing germs even after washing.    Oral Hygiene is also important to reduce your risk of infection.  Remember - BRUSH YOUR TEETH THE MORNING OF SURGERY WITH YOUR REGULAR TOOTHPASTE  Please do not use if you have an allergy to CHG or antibacterial soaps. If your skin becomes reddened/irritated stop using the CHG.  Do not shave (including legs and underarms) for at least 48 hours prior to first CHG shower. It is OK to shave your face.  Please follow these instructions carefully.  Shower the NIGHT BEFORE SURGERY and the MORNING OF SURGERY with DIAL Soap.   Pat yourself dry with a CLEAN TOWEL.  Wear CLEAN PAJAMAS to bed the night before surgery  Place CLEAN SHEETS on your bed the night of your first shower and DO NOT SLEEP WITH PETS.   Day of Surgery: Please shower morning of surgery  Wear Clean/Comfortable clothing the morning of surgery Do not apply any deodorants/lotions.   Remember to brush your teeth WITH YOUR REGULAR TOOTHPASTE.   Questions were answered. Patient verbalized understanding of instructions.

## 2023-04-13 NOTE — Anesthesia Preprocedure Evaluation (Addendum)
Anesthesia Evaluation  Patient identified by MRN, date of birth, ID band Patient awake    Reviewed: Allergy & Precautions, H&P , NPO status , Patient's Chart, lab work & pertinent test results  History of Anesthesia Complications (+) PONV and history of anesthetic complications  Airway Mallampati: II  TM Distance: >3 FB Neck ROM: Full    Dental no notable dental hx. (+) Dental Advisory Given, Teeth Intact   Pulmonary neg pulmonary ROS   Pulmonary exam normal breath sounds clear to auscultation       Cardiovascular hypertension, Pt. on medications + dysrhythmias Atrial Fibrillation  Rhythm:Regular Rate:Normal  Echo 2017 - Left ventricle: The cavity size was normal. Wall thickness was normal. The estimated ejection fraction was 55%. Wall motion was normal; there were no regional wall motion abnormalities. Features are consistent with a pseudonormal left ventricular filling pattern, with concomitant abnormal relaxation and increased filling pressure (grade 2 diastolic dysfunction).  - Aortic valve: There was no stenosis.  - Mitral valve: Somewhat flattened closure of the mitral valve without frank prolapse. There was trivial regurgitation.  - Left atrium: The atrium was mildly dilated.  - Right ventricle: The cavity size was normal. Systolic function was normal.  - Tricuspid valve: Peak RV-RA gradient (S): 22 mm Hg.  - Pulmonary arteries: PA peak pressure: 25 mm Hg (S).  - Inferior vena cava: The vessel was normal in size. The respirophasic diameter changes were in the normal range (>= 50%), consistent with normal central venous pressure.   Impressions:   - Normal LV size with EF 55%. Flattened closure of the mitral valve without frank prolapse. Trivial mitral regurgitation. Normal RV size and systolic function.     Neuro/Psych  Headaches  Anxiety      negative psych ROS   GI/Hepatic Neg liver ROS,GERD  Controlled,,  Endo/Other   negative endocrine ROS    Renal/GU negative Renal ROS     Musculoskeletal negative musculoskeletal ROS (+)    Abdominal   Peds  Hematology  (+) Blood dyscrasia, anemia   Anesthesia Other Findings   Reproductive/Obstetrics negative OB ROS                             Anesthesia Physical Anesthesia Plan  ASA: 3  Anesthesia Plan: General   Post-op Pain Management: Tylenol PO (pre-op)*   Induction: Intravenous  PONV Risk Score and Plan: 4 or greater and Ondansetron, Dexamethasone, Midazolam, Treatment may vary due to age or medical condition, Propofol infusion and TIVA  Airway Management Planned: Oral ETT  Additional Equipment: None  Intra-op Plan:   Post-operative Plan: Extubation in OR  Informed Consent: I have reviewed the patients History and Physical, chart, labs and discussed the procedure including the risks, benefits and alternatives for the proposed anesthesia with the patient or authorized representative who has indicated his/her understanding and acceptance.     Dental advisory given  Plan Discussed with: CRNA  Anesthesia Plan Comments: (PAT note written 04/13/2023 by Shonna Chock, PA-C.  Risks of anesthesia explained at length. This includes, but is not limited to, sore throat, damage to teeth, lips gums, tongue and vocal cords, nausea and vomiting, reactions to medications, stroke, heart attack, and death. All patient questions were answered and the patient wishes to proceed.  )        Anesthesia Quick Evaluation

## 2023-04-13 NOTE — Progress Notes (Signed)
Anesthesia Chart Review: SAME DAY WORK-UP  Case: [id number] Date/Time: 04/14/23 1115   Procedure: 25 GAUGE PARS PLANA VITRECTOMY WITH 20 GAUGE MVR PORT FOR MACULAR HOLE (Left)   Anesthesia type: General   Pre-op diagnosis: macular hole left eye   [facility] ROOM 08 / [facility]   Surgeons: Sherrie George, MD       DISCUSSION: Patient is a 62 year old female scheduled for the above procedure.  History includes never smoker, postoperative N/V, HLD, HTN, MVP (no frank MVP, trivial MR 09/2015 echo), palpitations (16% burden isolated SVEs 01/2023, started on metoprolol), anxiety, neuropathy, anemia, GERD.  She is followed by cardiologist Dr. Tresa Endo for HLD, HTN, and palpitations.  October 2024 ZIO monitor revealed minimum heart rate of 41 bpm with maximum heart rate of 218 bpm with average heart rate of 66 bpm.  There were 220 episodes of SVT with 16% isolated SVEs burden. She was started on metoprolol and referred to EP (Dr. York Pellant); however, at 03/31/23 follow-up with Joni Reining, NP, she was feeling much better on metoprolol. She denied rapid palpitations, fatigue, dizziness. She had been able to cut down on use of Xanax. Patient canceled her EP visit. In regards to preoperative evaluation, Joni Reining, NP wrote:   "According to the Revised Cardiac Risk Index (RCRI), her Perioperative Risk of Major Cardiac Event is (%): 0.4   Her Functional Capacity in METs is: 9.89 according to the Duke Activity Status Index (DASI).    Therefore, based on ACC/AHA guidelines, patient would be at acceptable risk for the planned procedure without further cardiovascular testing."    She is for labs as indicated on arrival.  Anesthesia team to evaluate on the day of surgery.     VS: Ht 5\' 3"  (1.6 m)   Wt 70.4 kg   LMP 03/28/2010   BMI 27.49 kg/m  BP Readings from Last 3 Encounters:  03/31/23 (!) 152/70  03/17/23 132/80  02/12/23 124/72   Pulse Readings from Last 3 Encounters:  03/31/23  80  03/17/23 (!) 53  02/12/23 (!) 45    PROVIDERS: Myrlene Broker, MD is PCP  Nicki Guadalajara, MD is cardiologist   LABS: Most recent lab results in [facility] include: Lab Results  Component Value Date   WBC 4.1 10/24/2022   HGB 13.7 10/24/2022   HCT 42.9 10/24/2022   PLT 164 10/24/2022   GLUCOSE 118 (H) 02/12/2023   CHOL 134 10/24/2022   TRIG 47 10/24/2022   HDL 68 10/24/2022   LDLCALC 55 10/24/2022   ALT 18 10/24/2022   AST 25 10/24/2022   NA 140 02/12/2023   K 4.5 02/12/2023   CL 102 02/12/2023   CREATININE 0.78 02/12/2023   BUN 10 02/12/2023   CO2 22 02/12/2023   TSH 3.860 02/12/2023    EKG: 03/31/23 ([facility]): Sinus rhythm with Premature atrial complexes When compared with ECG of 17-Mar-2023 09:39, Previous ECG has undetermined rhythm, needs review T wave inversion less evident in Anterior leads Confirmed by Joni Reining [phone]) on 03/31/2023 8:35:35 AM   CV: Long Term Monitor 02/14/23 - 02/21/23: Patient had a min HR of 41 bpm, max HR of 218 bpm, and avg HR of 66 bpm. Predominant underlying rhythm was Sinus Rhythm. Slight P wave morphology changes were noted. 220 Supraventricular Tachycardia runs occurred, the run with the fastest interval  lasting 5 beats with a max rate of 218 bpm, the longest lasting 10 beats with an avg rate of 176 bpm. Supraventricular  Tachycardia was detected within +/- 45 seconds of symptomatic patient event(s). Isolated SVEs were frequent (16.1%, [id number]), SVE  Couplets were frequent (6.9%, 24044), and SVE Triplets were occasional (3.3%, 7683). Isolated VEs were occasional (1.8%, 12429), VE Couplets were rare (<1.0%, 151), and VE Triplets were rare (<1.0%, 19). Ventricular Bigeminy and Trigeminy were present.   The predominant rhythm was sinus rhythm at 66 bpm.  The slowest sinus rhythm was sinus bradycardia at 41 bpm which occurred at 9:31 AM on 10/23 and the fastest sinus rhythm was sinus tachycardia at 141 bpm at 11:54 AM on  10/26.  There were frequent short bursts of SVT with the fastest interval lasting 5 beats at a maximum rate at 218 and the longest interval lasting 10 beats and an average rate at 176.  Isolated PACs and couplets were frequent with occasional atrial triplets.  There were occasional PVCs with rare couplets and triplets and ventricular bigeminy and trigeminy were present.  There were no episodes of atrial fibrillation or prolonged pauses.    Echo 10/03/15: Study Conclusions  - Left ventricle: The cavity size was normal. Wall thickness was    normal. The estimated ejection fraction was 55%. Wall motion was    normal; there were no regional wall motion abnormalities.    Features are consistent with a pseudonormal left ventricular    filling pattern, with concomitant abnormal relaxation and    increased filling pressure (grade 2 diastolic dysfunction).  - Aortic valve: There was no stenosis.  - Mitral valve: Somewhat flattened closure of the mitral valve    without frank prolapse. There was trivial regurgitation.  - Left atrium: The atrium was mildly dilated.  - Right ventricle: The cavity size was normal. Systolic function    was normal.  - Tricuspid valve: Peak RV-RA gradient (S): 22 mm Hg.  - Pulmonary arteries: PA peak pressure: 25 mm Hg (S).  - Inferior vena cava: The vessel was normal in size. The    respirophasic diameter changes were in the normal range (>= 50%),    consistent with normal central venous pressure.   Impressions:  - Normal LV size with EF 55%. Flattened closure of the mitral valve    without frank prolapse. Trivial mitral regurgitation. Normal RV    size and systolic function.    Nuclear stress test 07/08/11: Abnormal myocardial perfusion scan demonstrated an attenuation defect in the anterior region of the myocardium.  No ischemia or infarct/scar seen in the remaining myocardium.  Compared to the previous study, there is no significant change.  No significant ischemia  demonstrated.  This is a low risk scan.   Remote history of normal carotid US in 1997.   Past Medical History:  Diagnosis Date   Abnormal Pap smear    Allergic rhinitis    Allergy    Anemia    Anxiety    Dysrhythmia    palpiations per patient   GERD (gastroesophageal reflux disease)    History of shingles 04/2013   History of stress test 06/2011   Abnormal Myocadial perfusion scan demomstrating an attenuation defect in the anterior region of the myocardium, No ischemia or infaract/scar is seenin the remaining myocardium. Excercise capacity 12 METS.   HTN (hypertension)    Hx of echocardiogram 2009   Showed normal systolic and diastolic function. she does have borderline mitral valve prolapse.   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   Post-operative nausea and vomiting     Past Surgical History:  Procedure Laterality Date   CESAREAN SECTION     twice   COLONOSCOPY     CRYOABLATION     times 2 for abn paps   ESOPHAGOGASTRODUODENOSCOPY     HYSTEROSCOPY     LIGAMENT REPAIR Right 05/24/2020   Procedure: COLLATERAL LIGAMENT REPAIR BOTH SIDES;  Surgeon: Bjorn Pippin, MD;  Location: [facility];  Service: Orthopedics;  Laterality: Right;   PATELLAR TENDON REPAIR Right 05/24/2020   Procedure: PATELLA TENDON REPAIR;  Surgeon: Bjorn Pippin, MD;  Location: [facility];  Service: Orthopedics;  Laterality: Right;   PATELLECTOMY Right 05/24/2020   Procedure: PATELLECTOMY;  Surgeon: Bjorn Pippin, MD;  Location:  SURGERY CENTER;  Service: Orthopedics;  Laterality: Right;   WRIST SURGERY     right    MEDICATIONS: No current facility-administered medications for this encounter.    ALPRAZolam (XANAX) 0.5 MG tablet   Calcium Carb-Cholecalciferol (CALCIUM 600 + D PO)    cyanocobalamin (VITAMIN B12) 1000 MCG tablet   levocetirizine (XYZAL) 5 MG tablet   metoprolol succinate (TOPROL XL) 25 MG 24 hr tablet   pantoprazole (PROTONIX) 40 MG tablet   polyethylene glycol (MIRALAX / GLYCOLAX) 17 g packet   simvastatin (ZOCOR) 40 MG tablet   sodium chloride (OCEAN) 0.65 % SOLN nasal spray   Wheat Dextrin (BENEFIBER) POWD   triamcinolone cream (KENALOG) 0.1 %    Shonna Chock, PA-C Surgical Short Stay/Anesthesiology [facility] Phone [phone] [facility] Phone [phone] 04/13/2023 10:15 AM

## 2023-04-14 ENCOUNTER — Encounter (INDEPENDENT_AMBULATORY_CARE_PROVIDER_SITE_OTHER): Payer: BC Managed Care – PPO | Admitting: Ophthalmology

## 2023-04-14 ENCOUNTER — Ambulatory Visit (HOSPITAL_COMMUNITY): Payer: Self-pay | Admitting: Vascular Surgery

## 2023-04-14 ENCOUNTER — Ambulatory Visit (HOSPITAL_COMMUNITY)
Admission: RE | Admit: 2023-04-14 | Discharge: 2023-04-15 | Disposition: A | Discharge status: Home / Self Care | Payer: BC Managed Care – PPO | Attending: Ophthalmology | Admitting: Ophthalmology

## 2023-04-14 ENCOUNTER — Encounter (HOSPITAL_COMMUNITY)
Admission: RE | Disposition: A | Discharge status: Home / Self Care | Payer: Self-pay | Source: Home / Self Care | Attending: Ophthalmology

## 2023-04-14 ENCOUNTER — Encounter (HOSPITAL_COMMUNITY): Payer: Self-pay | Admitting: Ophthalmology

## 2023-04-14 DIAGNOSIS — H43813 Vitreous degeneration, bilateral: Secondary | ICD-10-CM | POA: Diagnosis not present

## 2023-04-14 DIAGNOSIS — I4891 Unspecified atrial fibrillation: Secondary | ICD-10-CM | POA: Diagnosis not present

## 2023-04-14 DIAGNOSIS — I10 Essential (primary) hypertension: Secondary | ICD-10-CM | POA: Insufficient documentation

## 2023-04-14 DIAGNOSIS — K219 Gastro-esophageal reflux disease without esophagitis: Secondary | ICD-10-CM | POA: Diagnosis not present

## 2023-04-14 DIAGNOSIS — H35342 Macular cyst, hole, or pseudohole, left eye: Secondary | ICD-10-CM

## 2023-04-14 DIAGNOSIS — H2513 Age-related nuclear cataract, bilateral: Secondary | ICD-10-CM

## 2023-04-14 DIAGNOSIS — H35033 Hypertensive retinopathy, bilateral: Secondary | ICD-10-CM

## 2023-04-14 HISTORY — DX: Cardiac arrhythmia, unspecified: I49.9

## 2023-04-14 HISTORY — PX: 25 GAUGE PARS PLANA VITRECTOMY WITH 20 GAUGE MVR PORT FOR MACULAR HOLE: SHX6096

## 2023-04-14 HISTORY — PX: LASER PHOTO ABLATION: SHX5942

## 2023-04-14 HISTORY — PX: GAS INSERTION: SHX5336

## 2023-04-14 HISTORY — PX: GAS/FLUID EXCHANGE: SHX5334

## 2023-04-14 LAB — CBC
HCT: 41.4 % (ref 36.0–46.0)
Hemoglobin: 13.5 g/dL (ref 12.0–15.0)
MCH: 29.9 pg (ref 26.0–34.0)
MCHC: 32.6 g/dL (ref 30.0–36.0)
MCV: 91.8 fL (ref 80.0–100.0)
Platelets: 177 10*3/uL (ref 150–400)
RBC: 4.51 MIL/uL (ref 3.87–5.11)
RDW: 12.9 % (ref 11.5–15.5)
WBC: 4.8 10*3/uL (ref 4.0–10.5)
nRBC: 0 % (ref 0.0–0.2)

## 2023-04-14 LAB — BASIC METABOLIC PANEL
Anion gap: 9 (ref 5–15)
BUN: 8 mg/dL (ref 8–23)
CO2: 22 mmol/L (ref 22–32)
Calcium: 9.7 mg/dL (ref 8.9–10.3)
Chloride: 109 mmol/L (ref 98–111)
Creatinine, Ser: 0.63 mg/dL (ref 0.44–1.00)
GFR, Estimated: >60 mL/min (ref >60)
Glucose, Bld: 107 mg/dL — ABNORMAL HIGH (ref 70–99)
Potassium: 3.5 mmol/L (ref 3.5–5.1)
Sodium: 140 mmol/L (ref 135–145)

## 2023-04-14 LAB — AUTOLOGOUS SERUM PATCH PREP

## 2023-04-14 SURGERY — 25 GAUGE PARS PLANA VITRECTOMY WITH 20 GAUGE MVR PORT FOR MACULAR HOLE
Anesthesia: General | Site: Eye | Laterality: Left

## 2023-04-14 MED ORDER — CHLORHEXIDINE GLUCONATE 0.12 % MT SOLN
15.0000 mL | Freq: Once | OROMUCOSAL | Status: AC
  Administered 2023-04-14: 15 mL via OROMUCOSAL
  Filled 2023-04-14: qty 15

## 2023-04-14 MED ORDER — BSS IO SOLN
INTRAOCULAR | Status: DC | PRN
  Administered 2023-04-14: 15 mL via INTRAOCULAR

## 2023-04-14 MED ORDER — BUPIVACAINE HCL (PF) 0.75 % IJ SOLN
INTRAMUSCULAR | Status: DC | PRN
  Administered 2023-04-14: 10 mL

## 2023-04-14 MED ORDER — CYCLOPENTOLATE HCL 1 % OP SOLN
OPHTHALMIC | Status: AC
  Administered 2023-04-14: 1 [drp] via OPHTHALMIC
  Filled 2023-04-14: qty 2

## 2023-04-14 MED ORDER — ORAL CARE MOUTH RINSE: 15.0000 mL | Freq: Once | OROMUCOSAL | Status: AC

## 2023-04-14 MED ORDER — POLYMYXIN B SULFATE 500000 UNITS IJ SOLR
INTRAMUSCULAR | Status: AC
  Filled 2023-04-14: qty 10

## 2023-04-14 MED ORDER — HYDROCODONE-ACETAMINOPHEN 5-325 MG PO TABS: 1.0000 | ORAL_TABLET | ORAL | Status: DC | PRN

## 2023-04-14 MED ORDER — TROPICAMIDE 1 % OP SOLN
OPHTHALMIC | Status: AC
  Administered 2023-04-14: 1 [drp] via OPHTHALMIC
  Filled 2023-04-14: qty 15

## 2023-04-14 MED ORDER — BACITRACIN-POLYMYXIN B 500-10000 UNIT/GM OP OINT
TOPICAL_OINTMENT | OPHTHALMIC | Status: AC
  Filled 2023-04-14: qty 3.5

## 2023-04-14 MED ORDER — DORZOLAMIDE HCL 2 % OP SOLN
1.0000 [drp] | Freq: Three times a day (TID) | OPHTHALMIC | Status: DC
  Filled 2023-04-14: qty 10

## 2023-04-14 MED ORDER — ACETAZOLAMIDE SODIUM 500 MG IJ SOLR
INTRAMUSCULAR | Status: AC
  Filled 2023-04-14: qty 500

## 2023-04-14 MED ORDER — SODIUM HYALURONATE 10 MG/ML IO SOLUTION
PREFILLED_SYRINGE | INTRAOCULAR | Status: AC
  Filled 2023-04-14: qty 0.85

## 2023-04-14 MED ORDER — PHENYLEPHRINE HCL 2.5 % OP SOLN
1.0000 [drp] | OPHTHALMIC | Status: AC | PRN
  Administered 2023-04-14 (×2): 1 [drp] via OPHTHALMIC

## 2023-04-14 MED ORDER — ALPRAZOLAM 0.5 MG PO TABS: 0.5000 mg | ORAL_TABLET | Freq: Two times a day (BID) | ORAL | Status: DC | PRN

## 2023-04-14 MED ORDER — FENTANYL CITRATE (PF) 100 MCG/2ML IJ SOLN: 25.0000 ug | INTRAMUSCULAR | Status: DC | PRN

## 2023-04-14 MED ORDER — OYSTER SHELL CALCIUM/D3 500-5 MG-MCG PO TABS
ORAL_TABLET | Freq: Every day | ORAL | Status: DC
  Administered 2023-04-14: 1 via ORAL
  Filled 2023-04-14: qty 1

## 2023-04-14 MED ORDER — SIMVASTATIN 20 MG PO TABS
40.0000 mg | ORAL_TABLET | Freq: Once | ORAL | Status: AC
  Administered 2023-04-14: 40 mg via ORAL
  Filled 2023-04-14: qty 2

## 2023-04-14 MED ORDER — MIDAZOLAM HCL 2 MG/2ML IJ SOLN
INTRAMUSCULAR | Status: DC | PRN
  Administered 2023-04-14: 2 mg via INTRAVENOUS

## 2023-04-14 MED ORDER — SODIUM CHLORIDE 0.9 % IV SOLN: INTRAVENOUS | Status: DC | PRN

## 2023-04-14 MED ORDER — NUTRISOURCE FIBER PO PACK
1.0000 | PACK | Freq: Every day | ORAL | Status: DC
  Filled 2023-04-14: qty 1

## 2023-04-14 MED ORDER — TETRACAINE HCL 0.5 % OP SOLN
2.0000 [drp] | Freq: Once | OPHTHALMIC | Status: DC
  Filled 2023-04-14: qty 4

## 2023-04-14 MED ORDER — MORPHINE SULFATE (PF) 2 MG/ML IV SOLN: 1.0000 mg | INTRAVENOUS | Status: DC | PRN

## 2023-04-14 MED ORDER — EPINEPHRINE PF 1 MG/ML IJ SOLN
INTRAMUSCULAR | Status: AC
  Filled 2023-04-14: qty 1

## 2023-04-14 MED ORDER — SALINE SPRAY 0.65 % NA SOLN: 1.0000 | NASAL | Status: DC | PRN

## 2023-04-14 MED ORDER — PROPOFOL 10 MG/ML IV BOLUS
INTRAVENOUS | Status: AC
  Filled 2023-04-14: qty 20

## 2023-04-14 MED ORDER — GATIFLOXACIN 0.5 % OP SOLN
OPHTHALMIC | Status: AC
  Administered 2023-04-14: 1 [drp] via OPHTHALMIC
  Filled 2023-04-14: qty 2.5

## 2023-04-14 MED ORDER — ATROPINE SULFATE 1 % OP SOLN
OPHTHALMIC | Status: AC
  Filled 2023-04-14: qty 5

## 2023-04-14 MED ORDER — BRIMONIDINE TARTRATE 0.2 % OP SOLN
1.0000 [drp] | Freq: Two times a day (BID) | OPHTHALMIC | Status: DC
  Filled 2023-04-14: qty 5

## 2023-04-14 MED ORDER — BSS IO SOLN
INTRAOCULAR | Status: AC
  Filled 2023-04-14: qty 15

## 2023-04-14 MED ORDER — TROPICAMIDE 1 % OP SOLN
1.0000 [drp] | OPHTHALMIC | Status: AC | PRN
  Administered 2023-04-14 (×2): 1 [drp] via OPHTHALMIC

## 2023-04-14 MED ORDER — CYCLOPENTOLATE HCL 1 % OP SOLN
1.0000 [drp] | OPHTHALMIC | Status: AC | PRN
  Administered 2023-04-14 (×2): 1 [drp] via OPHTHALMIC

## 2023-04-14 MED ORDER — EPHEDRINE SULFATE-NACL 50-0.9 MG/10ML-% IV SOSY
PREFILLED_SYRINGE | INTRAVENOUS | Status: DC | PRN
  Administered 2023-04-14: 5 mg via INTRAVENOUS
  Administered 2023-04-14: 10 mg via INTRAVENOUS

## 2023-04-14 MED ORDER — GATIFLOXACIN 0.5 % OP SOLN
1.0000 [drp] | OPHTHALMIC | Status: AC | PRN
  Administered 2023-04-14 (×2): 1 [drp] via OPHTHALMIC

## 2023-04-14 MED ORDER — ACETAMINOPHEN 325 MG PO TABS: 325.0000 mg | ORAL_TABLET | ORAL | Status: DC | PRN

## 2023-04-14 MED ORDER — POLYETHYLENE GLYCOL 3350 17 G PO PACK: 17.0000 g | PACK | Freq: Every day | ORAL | Status: DC

## 2023-04-14 MED ORDER — PREDNISOLONE ACETATE 1 % OP SUSP
1.0000 [drp] | Freq: Four times a day (QID) | OPHTHALMIC | Status: DC
  Filled 2023-04-14: qty 5

## 2023-04-14 MED ORDER — METOPROLOL SUCCINATE ER 25 MG PO TB24
12.5000 mg | ORAL_TABLET | Freq: Every day | ORAL | Status: DC
  Administered 2023-04-14: 12.5 mg via ORAL
  Filled 2023-04-14: qty 1

## 2023-04-14 MED ORDER — DEXAMETHASONE SODIUM PHOSPHATE 10 MG/ML IJ SOLN
INTRAMUSCULAR | Status: DC | PRN
  Administered 2023-04-14: .5 mL

## 2023-04-14 MED ORDER — CEFTAZIDIME 1 G IJ SOLR
INTRAMUSCULAR | Status: AC
  Filled 2023-04-14: qty 1

## 2023-04-14 MED ORDER — CEFAZOLIN SODIUM-DEXTROSE 2-4 GM/100ML-% IV SOLN
2.0000 g | INTRAVENOUS | Status: AC
  Administered 2023-04-14: 2 g via INTRAVENOUS

## 2023-04-14 MED ORDER — ATROPINE SULFATE 1 % OP SOLN
OPHTHALMIC | Status: DC | PRN
  Administered 2023-04-14: 2 [drp] via OPHTHALMIC

## 2023-04-14 MED ORDER — DEXAMETHASONE SODIUM PHOSPHATE 10 MG/ML IJ SOLN
INTRAMUSCULAR | Status: DC | PRN
  Administered 2023-04-14: 10 mg via INTRAVENOUS

## 2023-04-14 MED ORDER — DEXAMETHASONE SODIUM PHOSPHATE 10 MG/ML IJ SOLN
INTRAMUSCULAR | Status: AC
  Filled 2023-04-14: qty 1

## 2023-04-14 MED ORDER — STERILE WATER FOR INJECTION IJ SOLN
INTRAMUSCULAR | Status: AC
  Filled 2023-04-14: qty 20

## 2023-04-14 MED ORDER — TEMAZEPAM 7.5 MG PO CAPS: 15.0000 mg | ORAL_CAPSULE | Freq: Every evening | ORAL | Status: DC | PRN

## 2023-04-14 MED ORDER — DORZOLAMIDE HCL-TIMOLOL MAL 2-0.5 % OP SOLN
1.0000 [drp] | Freq: Once | OPHTHALMIC | Status: AC
  Administered 2023-04-14: 1 [drp] via OPHTHALMIC
  Filled 2023-04-14: qty 10

## 2023-04-14 MED ORDER — VITAMIN B-12 1000 MCG PO TABS
1000.0000 ug | ORAL_TABLET | Freq: Every day | ORAL | Status: DC
  Administered 2023-04-14: 1000 ug via ORAL
  Filled 2023-04-14: qty 1

## 2023-04-14 MED ORDER — DROPERIDOL 2.5 MG/ML IJ SOLN
0.6250 mg | Freq: Once | INTRAMUSCULAR | Status: AC | PRN
  Administered 2023-04-14: 0.625 mg via INTRAVENOUS

## 2023-04-14 MED ORDER — SODIUM CHLORIDE 0.45 % IV SOLN: INTRAVENOUS | Status: DC

## 2023-04-14 MED ORDER — PROPOFOL 10 MG/ML IV BOLUS
INTRAVENOUS | Status: DC | PRN
  Administered 2023-04-14: 110 mg via INTRAVENOUS

## 2023-04-14 MED ORDER — ONDANSETRON HCL 4 MG/2ML IJ SOLN
4.0000 mg | Freq: Four times a day (QID) | INTRAMUSCULAR | Status: DC
  Administered 2023-04-14: 4 mg via INTRAVENOUS
  Filled 2023-04-14: qty 2

## 2023-04-14 MED ORDER — DROPERIDOL 2.5 MG/ML IJ SOLN
INTRAMUSCULAR | Status: AC
  Filled 2023-04-14: qty 2

## 2023-04-14 MED ORDER — SODIUM CHLORIDE (PF) 0.9 % IJ SOLN
INTRAMUSCULAR | Status: AC
  Filled 2023-04-14: qty 10

## 2023-04-14 MED ORDER — ACETAMINOPHEN 500 MG PO TABS
1000.0000 mg | ORAL_TABLET | Freq: Once | ORAL | Status: AC
  Administered 2023-04-14: 1000 mg via ORAL
  Filled 2023-04-14: qty 2

## 2023-04-14 MED ORDER — MIDAZOLAM HCL 2 MG/2ML IJ SOLN
INTRAMUSCULAR | Status: AC
  Filled 2023-04-14: qty 2

## 2023-04-14 MED ORDER — STERILE WATER FOR INJECTION IJ SOLN
INTRAMUSCULAR | Status: DC | PRN
  Administered 2023-04-14: 10 mL

## 2023-04-14 MED ORDER — TRIAMCINOLONE ACETONIDE 40 MG/ML IJ SUSP
INTRAMUSCULAR | Status: AC
  Filled 2023-04-14: qty 5

## 2023-04-14 MED ORDER — CEFAZOLIN SODIUM-DEXTROSE 2-4 GM/100ML-% IV SOLN
INTRAVENOUS | Status: AC
  Filled 2023-04-14: qty 100

## 2023-04-14 MED ORDER — LIDOCAINE HCL 2 % IJ SOLN
INTRAMUSCULAR | Status: AC
  Filled 2023-04-14: qty 20

## 2023-04-14 MED ORDER — LIDOCAINE 2% (20 MG/ML) 5 ML SYRINGE
INTRAMUSCULAR | Status: DC | PRN
  Administered 2023-04-14: 60 mg via INTRAVENOUS

## 2023-04-14 MED ORDER — ROCURONIUM BROMIDE 10 MG/ML (PF) SYRINGE
PREFILLED_SYRINGE | INTRAVENOUS | Status: DC | PRN
  Administered 2023-04-14: 20 mg via INTRAVENOUS
  Administered 2023-04-14: 60 mg via INTRAVENOUS

## 2023-04-14 MED ORDER — GLYCOPYRROLATE 0.2 MG/ML IJ SOLN
INTRAMUSCULAR | Status: DC | PRN
  Administered 2023-04-14: .2 mg via INTRAVENOUS

## 2023-04-14 MED ORDER — ACETAZOLAMIDE SODIUM 500 MG IJ SOLR
500.0000 mg | Freq: Once | INTRAMUSCULAR | Status: DC
  Filled 2023-04-14: qty 500

## 2023-04-14 MED ORDER — LATANOPROST 0.005 % OP SOLN
1.0000 [drp] | Freq: Every day | OPHTHALMIC | Status: DC
  Filled 2023-04-14: qty 2.5

## 2023-04-14 MED ORDER — EPINEPHRINE PF 1 MG/ML IJ SOLN
INTRAOCULAR | Status: DC | PRN
  Administered 2023-04-14: 500.3 mL

## 2023-04-14 MED ORDER — ONDANSETRON HCL 4 MG/2ML IJ SOLN: 4.0000 mg | Freq: Four times a day (QID) | INTRAMUSCULAR | Status: DC | PRN

## 2023-04-14 MED ORDER — BUPIVACAINE HCL (PF) 0.75 % IJ SOLN
INTRAMUSCULAR | Status: AC
  Filled 2023-04-14: qty 10

## 2023-04-14 MED ORDER — FENTANYL CITRATE (PF) 250 MCG/5ML IJ SOLN
INTRAMUSCULAR | Status: AC
  Filled 2023-04-14: qty 5

## 2023-04-14 MED ORDER — BSS PLUS IO SOLN
INTRAOCULAR | Status: AC
  Filled 2023-04-14: qty 500

## 2023-04-14 MED ORDER — SODIUM HYALURONATE 10 MG/ML IO SOLUTION
PREFILLED_SYRINGE | INTRAOCULAR | Status: DC | PRN
  Administered 2023-04-14: .85 mL via INTRAOCULAR

## 2023-04-14 MED ORDER — PHENYLEPHRINE HCL-NACL 20-0.9 MG/250ML-% IV SOLN
INTRAVENOUS | Status: DC | PRN
  Administered 2023-04-14: 35 ug/min via INTRAVENOUS

## 2023-04-14 MED ORDER — BACITRACIN-POLYMYXIN B 500-10000 UNIT/GM OP OINT
TOPICAL_OINTMENT | OPHTHALMIC | Status: DC | PRN
  Administered 2023-04-14: 1 via OPHTHALMIC

## 2023-04-14 MED ORDER — MAGNESIUM HYDROXIDE 400 MG/5ML PO SUSP: 15.0000 mL | Freq: Four times a day (QID) | ORAL | Status: DC | PRN

## 2023-04-14 MED ORDER — ONDANSETRON HCL 4 MG/2ML IJ SOLN
INTRAMUSCULAR | Status: DC | PRN
  Administered 2023-04-14: 4 mg via INTRAVENOUS

## 2023-04-14 MED ORDER — SUGAMMADEX SODIUM 200 MG/2ML IV SOLN
INTRAVENOUS | Status: DC | PRN
  Administered 2023-04-14: 200 mg via INTRAVENOUS

## 2023-04-14 MED ORDER — GATIFLOXACIN 0.5 % OP SOLN
1.0000 [drp] | Freq: Four times a day (QID) | OPHTHALMIC | Status: DC
  Filled 2023-04-14: qty 2.5

## 2023-04-14 MED ORDER — FENTANYL CITRATE (PF) 250 MCG/5ML IJ SOLN
INTRAMUSCULAR | Status: DC | PRN
  Administered 2023-04-14: 100 ug via INTRAVENOUS
  Administered 2023-04-14: 50 ug via INTRAVENOUS

## 2023-04-14 MED ORDER — PHENYLEPHRINE HCL 2.5 % OP SOLN
OPHTHALMIC | Status: AC
  Administered 2023-04-14: 1 [drp] via OPHTHALMIC
  Filled 2023-04-14: qty 2

## 2023-04-14 MED ORDER — BACITRACIN-POLYMYXIN B 500-10000 UNIT/GM OP OINT
1.0000 | TOPICAL_OINTMENT | Freq: Three times a day (TID) | OPHTHALMIC | Status: DC
  Filled 2023-04-14: qty 3.5

## 2023-04-14 SURGICAL SUPPLY — 54 items
BAG COUNTER SPONGE SURGICOUNT (BAG) ×2 IMPLANT
BAND WRIST GAS GREEN (MISCELLANEOUS) IMPLANT
BLADE MVR KNIFE 20G (BLADE) ×2 IMPLANT
BNDG EYE OVAL 2 1/8 X 2 5/8 (GAUZE/BANDAGES/DRESSINGS) IMPLANT
CANNULA VLV SOFT TIP 25G (OPHTHALMIC) ×2 IMPLANT
CANNULA VLV SOFT TIP 25GA (OPHTHALMIC) ×4 IMPLANT
CORD BIPOLAR FORCEPS 12FT (ELECTRODE) ×2 IMPLANT
COTTONBALL LRG STERILE PKG (GAUZE/BANDAGES/DRESSINGS) ×6 IMPLANT
DRAPE INCISE 51X51 W/FILM STRL (DRAPES) IMPLANT
DRAPE OPHTHALMIC 77X100 STRL (CUSTOM PROCEDURE TRAY) ×2 IMPLANT
ERASER HMR WETFIELD 23G BP (MISCELLANEOUS) ×2 IMPLANT
FILTER BLUE MILLIPORE (MISCELLANEOUS) IMPLANT
FILTER STRAW FLUID ASPIR (MISCELLANEOUS) ×2 IMPLANT
GAS AUTO FILL CONSTEL (OPHTHALMIC) ×4 IMPLANT
GAS AUTO FILL CONSTELLATION (OPHTHALMIC) ×2 IMPLANT
GLOVE ECLIPSE 8.5 STRL (GLOVE) ×2 IMPLANT
GLOVE SS BIOGEL STRL SZ 6.5 (GLOVE) ×2 IMPLANT
GLOVE SS BIOGEL STRL SZ 7 (GLOVE) ×2 IMPLANT
GOWN STRL REUS W/ TWL LRG LVL3 (GOWN DISPOSABLE) ×6 IMPLANT
KIT BASIN OR (CUSTOM PROCEDURE TRAY) ×2 IMPLANT
NDL 18GX1X1/2 (RX/OR ONLY) (NEEDLE) ×2 IMPLANT
NDL 25GX 5/8IN NON SAFETY (NEEDLE) ×2 IMPLANT
NDL FILTER BLUNT 18X1 1/2 (NEEDLE) IMPLANT
NDL HYPO 30X.5 LL (NEEDLE) IMPLANT
NEEDLE 18GX1X1/2 (RX/OR ONLY) (NEEDLE) ×4 IMPLANT
NEEDLE 25GX 5/8IN NON SAFETY (NEEDLE) ×2 IMPLANT
NEEDLE FILTER BLUNT 18X1 1/2 (NEEDLE) ×2 IMPLANT
NEEDLE HYPO 30X.5 LL (NEEDLE) IMPLANT
NS IRRIG 1000ML POUR BTL (IV SOLUTION) ×2 IMPLANT
PACK VITRECTOMY CUSTOM (CUSTOM PROCEDURE TRAY) ×2 IMPLANT
PAD ARMBOARD 7.5X6 YLW CONV (MISCELLANEOUS) ×4 IMPLANT
PAK PIK VITRECTOMY CVS 25GA (OPHTHALMIC) ×2 IMPLANT
PIC ILLUMINATED 25G (OPHTHALMIC) ×2 IMPLANT
PIK ILLUMINATED 25G (OPHTHALMIC) ×2 IMPLANT
PROBE LASER ILLUM FLEX CVD 25G (OPHTHALMIC) IMPLANT
REPL STRA BRUSH NDL (NEEDLE) ×2 IMPLANT
REPL STRA BRUSH NEEDLE (NEEDLE) ×2 IMPLANT
RESERVOIR BACK FLUSH (MISCELLANEOUS) ×2 IMPLANT
ROLLS DENTAL (MISCELLANEOUS) ×4 IMPLANT
SCRAPER DIAMOND DUST MEMBRANE (MISCELLANEOUS) ×2 IMPLANT
SHIELD EYE LENSE ONLY DISP (GAUZE/BANDAGES/DRESSINGS) IMPLANT
SPONGE SURGIFOAM ABS GEL 100 (HEMOSTASIS) IMPLANT
SUT CHROMIC 7 0 TG140 8 (SUTURE) IMPLANT
SUT ETHILON 9 0 TG140 8 (SUTURE) ×2 IMPLANT
SUT POLY NON ABSORB 10-0 8 STR (SUTURE) IMPLANT
SUT SILK 4 0 RB 1 (SUTURE) IMPLANT
SYR 10ML LL (SYRINGE) IMPLANT
SYR 20ML LL LF (SYRINGE) ×2 IMPLANT
SYR 5ML LL (SYRINGE) IMPLANT
SYR BULB EAR ULCER 3OZ GRN STR (SYRINGE) ×2 IMPLANT
SYR TB 1ML LUER SLIP (SYRINGE) ×2 IMPLANT
TUBING HIGH PRESS EXTEN 6IN (TUBING) IMPLANT
WATER STERILE IRR 1000ML POUR (IV SOLUTION) ×2 IMPLANT
WIPE INSTRUMENT VISIWIPE 73X73 (MISCELLANEOUS) IMPLANT

## 2023-04-14 NOTE — Plan of Care (Signed)

## 2023-04-14 NOTE — Anesthesia Postprocedure Evaluation (Signed)
Anesthesia Post Note  Patient: CHEALSEA NORTON  Procedure(s) Performed: 25 GAUGE PARS PLANA VITRECTOMY WITH 20 GAUGE MVR PORT FOR MACULAR HOLE (Left) LASER PHOTO ABLATION (Left: Eye) INSERTION OF  C3F8 GAS (Left: Eye) GAS/FLUID EXCHANGE (Left: Eye)     Patient location during evaluation: PACU Anesthesia Type: General Level of consciousness: sedated and patient cooperative Pain management: pain level controlled Vital Signs Assessment: post-procedure vital signs reviewed and stable Respiratory status: spontaneous breathing Cardiovascular status: stable Anesthetic complications: no   No notable events documented.  Last Vitals:  Vitals:   04/14/23 1415 04/14/23 1506  BP: (!) 115/57 136/75  Pulse: 70 79  Resp: 12 17  Temp: 36.4 C   SpO2: 99% 100%    Last Pain:  Vitals:   04/14/23 1514  TempSrc:   PainSc: 0-No pain                 Lewie Loron

## 2023-04-14 NOTE — Op Note (Unsigned)
NAMEDENZEL, BACON MEDICAL RECORD NO: [id number] ACCOUNT NO: [id number] DATE OF BIRTH: [birth year] FACILITY: [facility] LOCATION: [facility] PHYSICIAN: Beulah Gandy. Ashley Royalty, MD  Operative Report   DATE OF PROCEDURE: 04/14/2023  ADMISSION DIAGNOSIS:  Macular hole, left eye.  PROCEDURE PERFORMED:  Repair of macular hole with pars plana vitrectomy, retinal photocoagulation, membrane peel, serum patch, gas-fluid exchange, all in the left eye.  SURGEON:  Alan Mulder, MD  ASSISTANT:  Rosalie Doctor, SA  ANESTHESIA:  General.  DESCRIPTION OF PROCEDURE: After proper endotracheal anesthesia was obtained and the patient was stable, the indirect ophthalmoscope of laser was moved into place. 555 burns were placed around the retinal periphery in weak areas of the retina.  The power  was 300 milliwatts, 1000 microns each, 0.1 seconds each.  Attention was then carried to the eye for the usual prep and drape.  Timeout was performed.  A conjunctival peritomy was performed with Westcott scissors and 0.12 forceps at 2 o'clock.  The sclera  was treated with diathermy.  A frown incision was made in the sclera with a diamond knife.  A 25-gauge trocars were placed at 4 o'clock and 10 o'clock, infusion at 4 o'clock.  The contact lens ring was anchored into place at 6 and 12 o'clock with 7-0  Vicryl suture.  Provisc was placed on the corneal surface and the flat contact lens was placed.  MVR incision was made at 2 o'clock in the scleral wound.  The BIOM viewing system was moved into place.  The pars plana vitrectomy was begun just behind the  cataractous lens.  Membranes were encountered in a core fashion and a core vitrectomy was carried out with minimal suction below 200 at all times.  Once the core vitreous was removed, the silicone tip suction line was brought into the eye and drawn down  towards the macular hole.  The FISH strike sign occurred and the posterior vitreous was removed in a posterior vitreous detachment method  from the edges of the macular hole.  The vitreous was drawn into the mid vitreous cavity and removed with the  vitreous cutter.  The vitrectomy was carried into the mid and far periphery with a 30-degree prismatic lens.  All visible vitreous was removed.  The magnifying contact lens was placed and the diamond dusted membrane scraper was used to remove internal  limiting membrane from the retina for approximately 1 disc diameter around the macular hole.  The edges of the hole were freed and were allowed to approach each other.  A gas fluid exchange was carried out.  Then, a sufficient time was allowed for  additional fluid to track down the walls of the eye and into the posterior segment.  During this time, a serum patch was prepared and C3F8 was prepared into a 14% concentration.  Additional fluid was removed with the [place] ophthalmic brush from the  posterior vitreous cavity.  The serum patch was delivered and the additional residual serum was removed with the silicone tip suction line.  C3F8 14% was exchanged for intravitreal gas.  The wound was sutured with two 9-0 nylon interrupted sutures at 2  o'clock.  The 25-gauge trocars were removed.  The wounds were tested and found to be secure.  The eye was filled with 14% C3F8 gas at this point.  The contact lens ring was removed.  Polymyxin and ceftazidime were rinsed around the globe for antibiotic  coverage.  Decadron 10 mg was injected into the lower subconjunctival space.  Atropine  solution was applied.  Marcaine was rinsed around the eye for postoperative pain.  Polysporin ophthalmic ointment and a patch and shield were placed.  The closing  pressure was 10 with a [place] tonometer.  COMPLICATIONS:  None.  DURATION:  Two hours.   PUS D: 04/14/2023 1:40:49 pm T: 04/14/2023 3:39:00 pm  JOB: [id number]/ [id number]

## 2023-04-14 NOTE — Transfer of Care (Signed)
Immediate Anesthesia Transfer of Care Note  Patient: Grace George  Procedure(s) Performed: 25 GAUGE PARS PLANA VITRECTOMY WITH 20 GAUGE MVR PORT FOR MACULAR HOLE (Left) LASER PHOTO ABLATION (Left: Eye) INSERTION OF  C3F8 GAS (Left: Eye) GAS/FLUID EXCHANGE (Left: Eye)  Patient Location: PACU  Anesthesia Type:General  Level of Consciousness: awake and alert   Airway & Oxygen Therapy: Patient Spontanous Breathing and Patient connected to nasal cannula oxygen  Post-op Assessment: Report given to RN  Post vital signs: Reviewed and stable  Last Vitals:  Vitals Value Taken Time  BP 137/73 04/14/23 1345  Temp    Pulse 67 04/14/23 1346  Resp 13 04/14/23 1346  SpO2 97 % 04/14/23 1346  Vitals shown include unfiled device data.  Last Pain:  Vitals:   04/14/23 0953  TempSrc:   PainSc: 0-No pain         Complications: No notable events documented.

## 2023-04-14 NOTE — Brief Op Note (Signed)
Brief Operative note   Preoperative diagnosis:  macular hole left eye Postoperative diagnosis  Macular hole left eye  Procedures: Pars plana vitrectomy, membrane peel, laser, serum patch, gas fluid exchange left eye  Surgeon:  Sherrie George, MD...  Assistant:  Rosalie Doctor SA    Anesthesia: General  Specimen: none  Estimated blood loss:  1cc  Complications: none  Patient sent to PACU in good condition  Composed by Sherrie George MD  Dictation number: [id number]

## 2023-04-14 NOTE — H&P (Signed)
I examined the patient today and there is no change in the medical status 

## 2023-04-15 ENCOUNTER — Encounter (HOSPITAL_COMMUNITY): Payer: Self-pay | Admitting: Ophthalmology

## 2023-04-15 DIAGNOSIS — H35342 Macular cyst, hole, or pseudohole, left eye: Secondary | ICD-10-CM | POA: Diagnosis not present

## 2023-04-15 MED ORDER — PREDNISOLONE ACETATE 1 % OP SUSP: 1.0000 [drp] | Freq: Four times a day (QID) | OPHTHALMIC | Status: DC

## 2023-04-15 MED ORDER — BACITRACIN-POLYMYXIN B 500-10000 UNIT/GM OP OINT: 1.0000 | TOPICAL_OINTMENT | Freq: Three times a day (TID) | OPHTHALMIC | Status: DC

## 2023-04-15 MED ORDER — GATIFLOXACIN 0.5 % OP SOLN: 1.0000 [drp] | Freq: Four times a day (QID) | OPHTHALMIC | Status: DC

## 2023-04-15 NOTE — Progress Notes (Signed)
04/15/2023, 6:48 AM  Mental Status:  Awake, Alert, Oriented  Anterior segment: Cornea  Clear    Anterior Chamber Clear    Lens:   Clear, Cataract  Intra Ocular Pressure 17 mmHg with Tonopen  Vitreous: Clear 95%gas bubble   Retina:  Attached Good laser reaction  Impression: Excellent result Retina attached  Final Diagnosis: Principal Problem:   Macular hole, left   Plan: start post operative eye drops.  Discharge to home.  Give post operative instructions  Grace George 04/15/2023, 6:48 AM

## 2023-04-15 NOTE — Discharge Summary (Signed)
Discharge summary not needed on [facility] patients per medical records. 

## 2023-04-15 NOTE — Plan of Care (Signed)
  Problem: Education: Goal: Knowledge of General Education information will improve Description: Including pain rating scale, medication(s)/side effects and non-pharmacologic comfort measures Outcome: Progressing   Problem: Nutrition: Goal: Adequate nutrition will be maintained Outcome: Progressing   Problem: Coping: Goal: Level of anxiety will decrease Outcome: Progressing   Problem: Pain Management: Goal: General experience of comfort will improve Outcome: Progressing   Problem: Safety: Goal: Ability to remain [place] from injury will improve Outcome: Progressing   Problem: Skin Integrity: Goal: Risk for impaired skin integrity will decrease Outcome: Progressing

## 2023-04-20 ENCOUNTER — Encounter (INDEPENDENT_AMBULATORY_CARE_PROVIDER_SITE_OTHER): Payer: BC Managed Care – PPO | Admitting: Ophthalmology

## 2023-04-20 DIAGNOSIS — H35342 Macular cyst, hole, or pseudohole, left eye: Secondary | ICD-10-CM

## 2023-05-03 ENCOUNTER — Other Ambulatory Visit: Payer: Self-pay | Admitting: Cardiovascular Disease

## 2023-05-13 ENCOUNTER — Encounter (INDEPENDENT_AMBULATORY_CARE_PROVIDER_SITE_OTHER): Payer: 59 | Admitting: Ophthalmology

## 2023-05-13 DIAGNOSIS — H35342 Macular cyst, hole, or pseudohole, left eye: Secondary | ICD-10-CM

## 2023-05-28 ENCOUNTER — Encounter: Payer: Self-pay | Admitting: Physician Assistant

## 2023-05-28 ENCOUNTER — Other Ambulatory Visit: Payer: Self-pay | Admitting: Physician Assistant

## 2023-05-28 ENCOUNTER — Telehealth: Payer: 59 | Admitting: Physician Assistant

## 2023-05-28 DIAGNOSIS — J209 Acute bronchitis, unspecified: Secondary | ICD-10-CM

## 2023-05-28 MED ORDER — DOXYCYCLINE HYCLATE 100 MG PO TABS: 100.0000 mg | ORAL_TABLET | Freq: Two times a day (BID) | ORAL | 0 refills | Status: DC

## 2023-05-28 MED ORDER — ALBUTEROL SULFATE HFA 108 (90 BASE) MCG/ACT IN AERS: 2.0000 | INHALATION_SPRAY | Freq: Four times a day (QID) | RESPIRATORY_TRACT | 0 refills | Status: DC | PRN

## 2023-05-28 MED ORDER — BENZONATATE 100 MG PO CAPS: 100.0000 mg | ORAL_CAPSULE | Freq: Three times a day (TID) | ORAL | 0 refills | Status: DC | PRN

## 2023-05-28 NOTE — Patient Instructions (Signed)
[name], thank you for joining Grace Climes, Grace George for today's virtual visit.  While this provider is not your primary care provider (PCP), if your PCP is located in our provider database this encounter information will be shared with them immediately following your visit.   A [name] MyChart account gives you access to today's visit and all your visits, tests, and labs performed at [name] " click here if you don't have a [name] MyChart account or go to [email or web address]  Consent: (Patient) Grace George provided verbal consent for this virtual visit at the beginning of the encounter.  Current Medications:  Current Outpatient Medications:    ALPRAZolam (XANAX) 0.5 MG tablet, TAKE 1 TABLET (0.5 MG TOTAL) BY MOUTH 2 (TWO) TIMES DAILY AS NEEDED FOR ANXIETY., Disp: 60 tablet, Rfl: 1   bacitracin-polymyxin b (POLYSPORIN) ophthalmic ointment, Place 1 Application into the left eye 3 (three) times daily. apply to eye every 12 hours while awake, Disp: , Rfl:    Calcium Carb-Cholecalciferol (CALCIUM 600 + D PO), Take 1 tablet by mouth daily., Disp: , Rfl:    cyanocobalamin (VITAMIN B12) 1000 MCG tablet, Take 1,000 mcg by mouth daily., Disp: , Rfl:    gatifloxacin (ZYMAXID) 0.5 % SOLN, Place 1 drop into the left eye 4 (four) times daily., Disp: , Rfl:    levocetirizine (XYZAL) 5 MG tablet, TAKE 1 TABLET BY MOUTH EVERY DAY IN THE EVENING, Disp: 90 tablet, Rfl: 3   metoprolol succinate (TOPROL XL) 25 MG 24 hr tablet, Take 0.5 tablets (12.5 mg total) by mouth daily., Disp: 90 tablet, Rfl: 3   pantoprazole (PROTONIX) 40 MG tablet, Take 1 tablet (40 mg total) by mouth as needed., Disp: 90 tablet, Rfl: 3   polyethylene glycol (MIRALAX / GLYCOLAX) 17 g packet, Take 17 g by mouth daily. Capful, Disp: , Rfl:    prednisoLONE acetate (PRED FORTE) 1 % ophthalmic suspension, Place 1 drop into the left eye 4 (four) times daily., Disp: , Rfl:    simvastatin (ZOCOR) 40 MG  tablet, TAKE 1 TABLET BY MOUTH EVERY DAY AT 6PM, Disp: 90 tablet, Rfl: 1   sodium chloride (OCEAN) 0.65 % SOLN nasal spray, Place 1 spray into both nostrils as needed for congestion., Disp: , Rfl:    triamcinolone cream (KENALOG) 0.1 %, Apply 1 Application topically 2 (two) times daily. (Patient not taking: Reported on 04/08/2023), Disp: 100 g, Rfl: 3   Wheat Dextrin (BENEFIBER) POWD, Take 10 mLs by mouth daily., Disp: , Rfl:    Medications ordered in this encounter:  No orders of the defined types were placed in this encounter.    *If you need refills on other medications prior to your next appointment, please contact your pharmacy*  Follow-Up: Call back or seek an in-person evaluation if the symptoms worsen or if the condition fails to improve as anticipated.  [facility] [phone]  Other Instructions Please message back ASAP with your COVID results so we can determine additional treatments.   If you have been instructed to have an in-person evaluation today at a local Urgent Care facility, please use the link below. It will take you to a list of all of our available [facility], including address, phone number and hours of operation. Please do not delay care.  [facility]  If you or a family member do not have a primary care provider, use the link below to schedule a visit and establish  care. When you choose a [facility] primary care physician or advanced practice provider, you gain a long-term partner in health. Find a Primary Care Provider  Learn more about [facility]'s in-office and virtual care options: [facility] - Get Care Now

## 2023-05-28 NOTE — Progress Notes (Signed)
Virtual Visit Consent   Grace George, you are scheduled for a virtual visit with a [facility] provider today. Just as with appointments in the office, your consent must be obtained to participate. Your consent will be active for this visit and any virtual visit you may have with one of our providers in the next 365 days. If you have a MyChart account, a copy of this consent can be sent to you electronically.  As this is a virtual visit, video technology does not allow for your provider to perform a traditional examination. This may limit your provider's ability to fully assess your condition. If your provider identifies any concerns that need to be evaluated in person or the need to arrange testing (such as labs, EKG, etc.), we will make arrangements to do so. Although advances in technology are sophisticated, we cannot ensure that it will always work on either your end or our end. If the connection with a video visit is poor, the visit may have to be switched to a telephone visit. With either a video or telephone visit, we are not always able to ensure that we have a secure connection.  By engaging in this virtual visit, you consent to the provision of healthcare and authorize for your insurance to be billed (if applicable) for the services provided during this visit. Depending on your insurance coverage, you may receive a charge related to this service.  I need to obtain your verbal consent now. Are you willing to proceed with your visit today? Grace George has provided verbal consent on 05/28/2023 for a virtual visit (video or telephone). Piedad Climes, [place]  Date: 05/28/2023 5:41 PM  Virtual Visit via Video Note   I, Piedad Climes, connected with  Grace George  ([id number], 1961-04-24) on 05/28/23 at  5:45 PM EST by a video-enabled telemedicine application and verified that I am speaking with the correct person using two identifiers.  Location: Patient: Virtual Visit Location  Patient: Home Provider: Virtual Visit Location Provider: Home Office   I discussed the limitations of evaluation and management by telemedicine and the availability of in person appointments. The patient expressed understanding and agreed to proceed.    History of Present Illness: Grace George is a 63 y.o. who identifies as a female who was assigned female at birth, and is being seen today for 4 days of URI symptoms starting with sore throat, fatigue, body aches. Subsequently developed chest congestion and a cough. Denies fevers. Still with fatigue and some SOB with exertion. Cough worse since yesterday that has become productive of thick phlegm. Denies recent travel. Son sick with similar symptoms but improved quickly.   HPI: HPI  Problems:  Patient Active Problem List   Diagnosis Date Noted   Macular hole, left 04/14/2023   Visual changes 01/15/2023   Change in hearing of left ear 11/10/2022   Headache 06/27/2021   Elevated TSH 06/27/2021   Idiopathic neuropathy 09/20/2019   Situational anxiety 08/15/2014   Routine general medical examination at a health care facility 02/02/2014   PAC (premature atrial contraction) 08/15/2013   History of colonic polyps 08/15/2010   Allergic rhinitis 01/05/2009   Mitral valve disease 01/25/2008   Sinusitis 01/25/2008   HYPERCHOLESTEROLEMIA 01/24/2008   Essential hypertension 01/24/2008   GERD 01/24/2008    Allergies:  Allergies  Allergen Reactions   Egg-Derived Products Rash    Causes a rash, if she eats a lot!   Sulfa Antibiotics Itching and Rash  Medications:  Current Outpatient Medications:    albuterol (VENTOLIN HFA) 108 (90 Base) MCG/ACT inhaler, Inhale 2 puffs into the lungs every 6 (six) hours as needed for wheezing or shortness of breath., Disp: 8 g, Rfl: 0   benzonatate (TESSALON) 100 MG capsule, Take 1 capsule (100 mg total) by mouth 3 (three) times daily as needed for cough., Disp: 30 capsule, Rfl: 0   doxycycline (VIBRA-TABS)  100 MG tablet, Take 1 tablet (100 mg total) by mouth 2 (two) times daily., Disp: 14 tablet, Rfl: 0   ALPRAZolam (XANAX) 0.5 MG tablet, TAKE 1 TABLET (0.5 MG TOTAL) BY MOUTH 2 (TWO) TIMES DAILY AS NEEDED FOR ANXIETY., Disp: 60 tablet, Rfl: 1   Calcium Carb-Cholecalciferol (CALCIUM 600 + D PO), Take 1 tablet by mouth daily., Disp: , Rfl:    cyanocobalamin (VITAMIN B12) 1000 MCG tablet, Take 1,000 mcg by mouth daily., Disp: , Rfl:    levocetirizine (XYZAL) 5 MG tablet, TAKE 1 TABLET BY MOUTH EVERY DAY IN THE EVENING, Disp: 90 tablet, Rfl: 3   metoprolol succinate (TOPROL XL) 25 MG 24 hr tablet, Take 0.5 tablets (12.5 mg total) by mouth daily., Disp: 90 tablet, Rfl: 3   pantoprazole (PROTONIX) 40 MG tablet, Take 1 tablet (40 mg total) by mouth as needed., Disp: 90 tablet, Rfl: 3   prednisoLONE acetate (PRED FORTE) 1 % ophthalmic suspension, Place 1 drop into the left eye 4 (four) times daily., Disp: , Rfl:    simvastatin (ZOCOR) 40 MG tablet, TAKE 1 TABLET BY MOUTH EVERY DAY AT 6PM, Disp: 90 tablet, Rfl: 1   sodium chloride (OCEAN) 0.65 % SOLN nasal spray, Place 1 spray into both nostrils as needed for congestion., Disp: , Rfl:    Wheat Dextrin (BENEFIBER) POWD, Take 10 mLs by mouth daily., Disp: , Rfl:   Observations/Objective: Patient is well-developed, well-nourished in no acute distress.  Resting comfortably at home.  Head is normocephalic, atraumatic.  No labored breathing. Speech is clear and coherent with logical content.  Patient is alert and oriented at baseline.  Assessment and Plan: 1. Acute bronchitis, unspecified organism (Primary) - benzonatate (TESSALON) 100 MG capsule; Take 1 capsule (100 mg total) by mouth 3 (three) times daily as needed for cough.  Dispense: 30 capsule; Refill: 0 - albuterol (VENTOLIN HFA) 108 (90 Base) MCG/ACT inhaler; Inhale 2 puffs into the lungs every 6 (six) hours as needed for wheezing or shortness of breath.  Dispense: 8 g; Refill: 0  Want her to COVID  test to rule out as a precaution. She is to message back directly after she has tested.   Question COVID versus bronchitis. If positive, will treat with antiviral as she is still within treatment window. Will have her increase fluids and rest. Tessalon and Albuterol per orders.   Follow Up Instructions: I discussed the assessment and treatment plan with the patient. The patient was provided an opportunity to ask questions and all were answered. The patient agreed with the plan and demonstrated an understanding of the instructions.  A copy of instructions were sent to the patient via MyChart unless otherwise noted below.   The patient was advised to call back or seek an in-person evaluation if the symptoms worsen or if the condition fails to improve as anticipated.    Piedad Climes, PA-C

## 2023-06-09 ENCOUNTER — Ambulatory Visit: Payer: 59 | Admitting: Internal Medicine

## 2023-06-09 ENCOUNTER — Encounter: Payer: Self-pay | Admitting: Internal Medicine

## 2023-06-09 VITALS — BP 124/80 | HR 55 | Temp 98.5°F | Ht 63.0 in | Wt 151.0 lb

## 2023-06-09 DIAGNOSIS — J101 Influenza due to other identified influenza virus with other respiratory manifestations: Secondary | ICD-10-CM | POA: Insufficient documentation

## 2023-06-09 NOTE — Progress Notes (Signed)
   Subjective:   Patient ID: Grace George, female    DOB: Sep 05, 1960, 63 y.o.   MRN: [id number]  Cough Associated symptoms include postnasal drip. Pertinent negatives include no chills, ear pain, fever, rhinorrhea, sore throat, shortness of breath or wheezing.   The patient is a 63 YO female coming in for follow up flu. Treated with cough drops and albuterol inhaler. She is having some congestion still and cough but still fatigue. Overall improving but not well. Started about 2 weeks ago.   Review of Systems  Constitutional:  Positive for activity change and fatigue. Negative for appetite change, chills, fever and unexpected weight change.  HENT:  Positive for congestion and postnasal drip. Negative for ear discharge, ear pain, rhinorrhea, sinus pressure, sinus pain, sneezing, sore throat, tinnitus, trouble swallowing and voice change.   Eyes: Negative.   Respiratory:  Positive for cough. Negative for chest tightness, shortness of breath and wheezing.   Cardiovascular: Negative.   Gastrointestinal: Negative.   Musculoskeletal: Negative.   Neurological: Negative.     Objective:  Physical Exam Constitutional:      Appearance: She is well-developed.  HENT:     Head: Normocephalic and atraumatic.     Comments: Oropharynx with redness and clear drainage, nose with swollen turbinates, TMs normal bilaterally.  Neck:     Thyroid: No thyromegaly.  Cardiovascular:     Rate and Rhythm: Normal rate and regular rhythm.  Pulmonary:     Effort: Pulmonary effort is normal. No respiratory distress.     Breath sounds: Normal breath sounds. No wheezing or rales.  Abdominal:     General: Bowel sounds are normal. There is no distension.     Palpations: Abdomen is soft.     Tenderness: There is no abdominal tenderness. There is no rebound.  Musculoskeletal:        General: No tenderness.     Cervical back: Normal range of motion.  Lymphadenopathy:     Cervical: No cervical adenopathy.  Skin:     General: Skin is warm and dry.  Neurological:     Mental Status: She is alert and oriented to person, place, and time.     Coordination: Coordination normal.     Vitals:   06/09/23 1109  BP: 124/80  Pulse: (!) 55  Temp: 98.5 F (36.9 C)  TempSrc: Oral  SpO2: 99%  Weight: 151 lb (68.5 kg)  Height: 5\' 3"  (1.6 m)    Assessment & Plan:  Visit time 15 minutes in face to face communication with patient and coordination of care, additional 5 minutes spent in record review, coordination or care, ordering tests, communicating/referring to other healthcare professionals, documenting in medical records all on the same day of the visit for total time 20 minutes spent on the visit.

## 2023-06-09 NOTE — Assessment & Plan Note (Signed)
Recovering but still with fatigue and mild cough. Advised to continue tessalon perles as needed. She does not seem to have true SOB but tiredness on exertion. Advised to increase activity as able. Can use albuterol prn but no wheezing on exam.

## 2023-06-15 ENCOUNTER — Encounter (INDEPENDENT_AMBULATORY_CARE_PROVIDER_SITE_OTHER): Payer: 59 | Admitting: Ophthalmology

## 2023-06-15 DIAGNOSIS — I10 Essential (primary) hypertension: Secondary | ICD-10-CM

## 2023-06-15 DIAGNOSIS — H35033 Hypertensive retinopathy, bilateral: Secondary | ICD-10-CM

## 2023-06-15 DIAGNOSIS — H20012 Primary iridocyclitis, left eye: Secondary | ICD-10-CM | POA: Diagnosis not present

## 2023-06-15 DIAGNOSIS — H43811 Vitreous degeneration, right eye: Secondary | ICD-10-CM

## 2023-07-06 ENCOUNTER — Encounter (INDEPENDENT_AMBULATORY_CARE_PROVIDER_SITE_OTHER): Payer: 59 | Admitting: Ophthalmology

## 2023-07-06 DIAGNOSIS — H35342 Macular cyst, hole, or pseudohole, left eye: Secondary | ICD-10-CM | POA: Diagnosis not present

## 2023-07-06 DIAGNOSIS — I10 Essential (primary) hypertension: Secondary | ICD-10-CM

## 2023-07-06 DIAGNOSIS — H3091 Unspecified chorioretinal inflammation, right eye: Secondary | ICD-10-CM | POA: Diagnosis not present

## 2023-07-06 DIAGNOSIS — H35033 Hypertensive retinopathy, bilateral: Secondary | ICD-10-CM | POA: Diagnosis not present

## 2023-07-06 DIAGNOSIS — H43811 Vitreous degeneration, right eye: Secondary | ICD-10-CM | POA: Diagnosis not present

## 2023-07-13 ENCOUNTER — Telehealth: Payer: Self-pay | Admitting: Cardiology

## 2023-07-13 NOTE — Telephone Encounter (Signed)
 Patient identification verified by 2 forms. Grace Rail, RN    Called and spoke to patient  Patient states:   -developed chest pain on Saturday   -Also had chest pain on Sunday   -chest pain symptoms are new for her   -thought chest pain was gas, took gas relief and pain decreased a little   -pain lasted for a few hours   -developed pain in left arm after chest pain resolved yesterday   -would like sooner appointment then 4/9 for chest pain  Patient denies:   -active chest pain at time of call   -SOB/difficulty breathing  Patient scheduled for OV 3/18 at 10:05am  Reviewed ED warning signs/precautions  Patient verbalized understanding, no questions at this time

## 2023-07-13 NOTE — Telephone Encounter (Signed)
   Pt c/o of Chest Pain: STAT if active CP, including tightness, pressure, jaw pain, radiating pain to shoulder/upper arm/back, CP unrelieved by Nitro. Symptoms reported of SOB, nausea, vomiting, sweating.  1. Are you having CP right now?  No    2. Are you experiencing any other symptoms (ex. SOB, nausea, vomiting, sweating)? No    3. Is your CP continuous or coming and going? Coming and going    4. Have you taken Nitroglycerin? No    5. How long have you been experiencing CP? Two days over the weekend     6. If NO CP at time of call then end call with telling Pt to call back or call 911 if Chest pain returns prior to return call from triage team.

## 2023-07-13 NOTE — Progress Notes (Unsigned)
 Cardiology Office Note:  .   Date:  07/14/2023  ID:  Grace George, DOB 1961/01/28, MRN [id number] PCP: Grace Broker, MD  [facility] Providers Cardiologist:  Grace Guadalajara, MD History of Present Illness: [name]   Grace George is a 63 y.o. female with a past medical history of HTN, PACs/SVT, HLD. Patient is followed by Dr. Tresa George, presents today for evaluation of chest pain. Planning to establish care with Dr. Bjorn George when Dr. Rivka George   Per chart review, patient  previously underwent nuclear stress test in 2013 that showed no evidence of ischemia. Echocardiogram in 2017 showed EF 55%, no regional wall motion abnormalities, grade II DD, trivial MR. She wore a cardiac monitor in 02/2023 that showed sinus rhythm with frequent short bursts of SVT with the longest interval lasting 10 beats. There was also frequent PACs with 16.1% burden. Patient has been on metoprolol tartrate for SVT/PACs.   Today, patient presents for evaluation of recent episodes of chest pain. The pain, described as somewhat stabbing, was first noticed after an hour and a half of walking and after eating on Saturday 3/15. The patient experienced a similar episode the following day, "Sunday, after eating. The patient did not experience any chest pain on Monday. The patient found relief from the chest pain after taking gas relief medication and antacids. The patient denies any associated shortness of breath. The patient maintains an active lifestyle, walking for about twenty to thirty minutes daily. The patient's palpitations have been well-controlled with metoprolol. The patient has a family history of heart disease in her mother. She is on cholesterol medication, denies tobacco use history or history of diabetes.   ROS: Patient denies shortness of breath, palpitations, syncope, near syncope, dizziness.   Studies Reviewed: .    Cardiac Studies & Procedures    ______________________________________________________________________________________________     ECHOCARDIOGRAM  ECHOCARDIOGRAM COMPLETE 10/03/2015  Narrative *[facility]* [address] [place], [place] [address] [phone]  ------------------------------------------------------------------- Transthoracic Echocardiography  Patient:    George, Grace F MR #:       [id number] Study Date: 10/03/2015 Gender:     F Age:        55 Height:     160 cm Weight:     72" .4 kg BSA:        1.81 m^2 Pt. Status: Room:  ORDERING     Grace George, M.D. REFERRING    Grace George, M.D. SONOGRAPHER  [place], Will ATTENDING    Marca Ancona, M.D. PERFORMING   [facility]  cc:  ------------------------------------------------------------------- LV EF: 55%  ------------------------------------------------------------------- Indications:      (R06.02).  ------------------------------------------------------------------- History:   PMH:  Acquired from the patient and from the patient&'s chart.  Dyspnea.  Mitral valve disease.  Risk factors: Hypertension. Dyslipidemia.  ------------------------------------------------------------------- Study Conclusions  - Left ventricle: The cavity size was normal. Wall thickness was normal. The estimated ejection fraction was 55%. Wall motion was normal; there were no regional wall motion abnormalities. Features are consistent with a pseudonormal left ventricular filling pattern, with concomitant abnormal relaxation and increased filling pressure (grade 2 diastolic dysfunction). - Aortic valve: There was no stenosis. - Mitral valve: Somewhat flattened closure of the mitral valve without frank prolapse. There was trivial regurgitation. - Left atrium: The atrium was mildly dilated. - Right ventricle: The cavity size was normal. Systolic function was normal. - Tricuspid valve: Peak RV-RA gradient (S): 22 mm Hg. - Pulmonary arteries:  PA peak pressure: 25 mm Hg (  S). - Inferior vena cava: The vessel was normal in size. The respirophasic diameter changes were in the normal range (>= 50%), consistent with normal central venous pressure.  Impressions:  - Normal LV size with EF 55%. Flattened closure of the mitral valve without frank prolapse. Trivial mitral regurgitation. Normal RV size and systolic function.  ------------------------------------------------------------------- Labs, prior tests, procedures, and surgery: ECG.     Abnormal. Transthoracic echocardiography.  M-mode, complete 2D, spectral Doppler, and color Doppler.  Birthdate:  Patient birthdate: August 21, 1960.  Age:  Patient is 63 yr old.  Sex:  Gender: female. BMI: 28.3 kg/m^2.  Blood pressure:     142/74  Patient status: Outpatient.  Study date:  Study date: 10/03/2015. Study time: 08:47 AM.  Location:  [facility]  -------------------------------------------------------------------  ------------------------------------------------------------------- Left ventricle:  The cavity size was normal. Wall thickness was normal. The estimated ejection fraction was 55%. Wall motion was normal; there were no regional wall motion abnormalities. Features are consistent with a pseudonormal left ventricular filling pattern, with concomitant abnormal relaxation and increased filling pressure (grade 2 diastolic dysfunction).  ------------------------------------------------------------------- Aortic valve:   Trileaflet.  Doppler:   There was no stenosis. There was no regurgitation.  ------------------------------------------------------------------- Aorta:  Aortic root: The aortic root was normal in size. Ascending aorta: The ascending aorta was normal in size.  ------------------------------------------------------------------- Mitral valve:  Somewhat flattened closure of the mitral valve without frank prolapse.  Doppler:   There was no evidence  for stenosis.   There was trivial regurgitation.  ------------------------------------------------------------------- Left atrium:  The atrium was mildly dilated.  ------------------------------------------------------------------- Right ventricle:  The cavity size was normal. Systolic function was normal.  ------------------------------------------------------------------- Pulmonic valve:    Structurally normal valve.   Cusp separation was normal.  Doppler:  Transvalvular velocity was within the normal range. There was trivial regurgitation.  ------------------------------------------------------------------- Tricuspid valve:   Doppler:  There was trivial regurgitation.  ------------------------------------------------------------------- Right atrium:  The atrium was normal in size.  ------------------------------------------------------------------- Pericardium:  There was no pericardial effusion.  ------------------------------------------------------------------- Systemic veins: Inferior vena cava: The vessel was normal in size. The respirophasic diameter changes were in the normal range (>= 50%), consistent with normal central venous pressure.  ------------------------------------------------------------------- Measurements  Left ventricle                           Value        Reference LV ID, ED, PLAX chordal                  44.6  mm     43 - 52 LV ID, ES, PLAX chordal                  31.3  mm     23 - 38 LV fx shortening, PLAX chordal           30    %      >=29 LV PW thickness, ED                      7.53  mm     --------- IVS/LV PW ratio, ED                      1.29         <=1.3 Stroke volume, 2D  56    ml     --------- Stroke volume/bsa, 2D                    31    ml/m^2 --------- LV ejection fraction, 1-p A4C            56    %      --------- LV end-diastolic volume, 2-p             81    ml     --------- LV end-systolic volume, 2-p               36    ml     --------- LV ejection fraction, 2-p                56    %      --------- Stroke volume, 2-p                       45    ml     --------- LV end-diastolic volume/bsa, 2-p         45    ml/m^2 --------- LV end-systolic volume/bsa, 2-p          20    ml/m^2 --------- Stroke volume/bsa, 2-p                   24.8  ml/m^2 --------- LV e&', lateral                           10.4  cm/s   --------- LV E/e&', lateral                         6.46         --------- LV e&', medial                            10.7  cm/s   --------- LV E/e&', medial                          6.28         --------- LV e&', average                           10.55 cm/s   --------- LV E/e&', average                         6.37         ---------  Ventricular septum                       Value        Reference IVS thickness, ED                        9.68  mm     ---------  LVOT                                     Value        Reference LVOT ID, S  19    mm     --------- LVOT area                                2.84  cm^2   --------- LVOT ID                                  19    mm     --------- LVOT peak velocity, S                    82.2  cm/s   --------- LVOT mean velocity, S                    51.7  cm/s   --------- LVOT VTI, S                              19.8  cm     --------- LVOT peak gradient, S                    3     mm Hg  --------- Stroke volume (SV), LVOT DP              56.1  ml     --------- Stroke index (SV/bsa), LVOT DP           30.9  ml/m^2 ---------  Aorta                                    Value        Reference Aortic root ID, ED                       32    mm     --------- Ascending aorta ID, A-P, S               27    mm     ---------  Left atrium                              Value        Reference LA ID, A-P, ES                           30    mm     --------- LA ID/bsa, A-P                           1.65  cm/m^2 <=2.2 LA volume, S                              56    ml     --------- LA volume/bsa, S                         30.9  ml/m^2 --------- LA volume, ES, 1-p A4C                   46    ml     ---------  LA volume/bsa, ES, 1-p A4C               25.3  ml/m^2 --------- LA volume, ES, 1-p A2C                   65    ml     --------- LA volume/bsa, ES, 1-p A2C               35.8  ml/m^2 ---------  Mitral valve                             Value        Reference Mitral E-wave peak velocity              67.2  cm/s   --------- Mitral A-wave peak velocity              46.3  cm/s   --------- Mitral deceleration time         (H)     342   ms     150 - 230 Mitral E/A ratio, peak                   1.5          ---------  Pulmonary arteries                       Value        Reference PA pressure, S, DP                       25    mm Hg  <=30  Tricuspid valve                          Value        Reference Tricuspid regurg peak velocity           236   cm/s   --------- Tricuspid peak RV-RA gradient            22    mm Hg  ---------  Systemic veins                           Value        Reference Estimated CVP                            3     mm Hg  ---------  Right ventricle                          Value        Reference RV s&', lateral, S                        13.3  cm/s   ---------  Legend: (L)  and  (H)  mark values outside specified reference range.  ------------------------------------------------------------------- Prepared and Electronically Authenticated by  Marca Ancona, M.D. 2017-06-07T16:18:15    MONITORS  LONG TERM MONITOR (3-14 DAYS) 03/04/2023  Narrative Patch Wear Time:  6 days and 23 hours (2024-10-19T18:15:43-0400 to 2024-10-26T18:11:03-399)  Patient had a min HR of 41 bpm, max HR of 218 bpm, and avg HR of 66 bpm. Predominant underlying rhythm was Sinus Rhythm. Slight P wave  morphology changes were noted. 220 Supraventricular Tachycardia runs occurred, the run with the fastest interval lasting 5  beats with a max rate of 218 bpm, the longest lasting 10 beats with an avg rate of 176 bpm. Supraventricular Tachycardia was detected within +/- 45 seconds of symptomatic patient event(s). Isolated SVEs were frequent (16.1%, [id number]), SVE Couplets were frequent (6.9%, 24044), and SVE Triplets were occasional (3.3%, 7683). Isolated VEs were occasional (1.8%, 12429), VE Couplets were rare (<1.0%, 151), and VE Triplets were rare (<1.0%, 19). Ventricular Bigeminy and Trigeminy were present.  The predominant rhythm was sinus rhythm at 66 bpm.  The slowest sinus rhythm was sinus bradycardia at 41 bpm which occurred at 9:31 AM on 10/23 and the fastest sinus rhythm was sinus tachycardia at 141 bpm at 11:54 AM on 10/26.  There were frequent short bursts of SVT with the fastest interval lasting 5 beats at a maximum rate at 218 and the longest interval lasting 10 beats and an average rate at 176.  Isolated PACs and couplets were frequent with occasional atrial triplets.  There were occasional PVCs with rare couplets and triplets and ventricular bigeminy and trigeminy were present.  There were no episodes of atrial fibrillation or prolonged pauses.       ______________________________________________________________________________________________       Risk Assessment/Calculations:             Physical Exam:   VS:  BP 104/62   Pulse 88   Ht 5\' 3"  (1.6 m)   Wt 150 lb 9.6 oz (68.3 kg)   LMP 03/28/2010   SpO2 100%   BMI 26.68 kg/m    Wt Readings from Last 3 Encounters:  07/14/23 150 lb 9.6 oz (68.3 kg)  06/09/23 151 lb (68.5 kg)  04/14/23 153 lb (69.4 kg)    GEN: Well nourished, well developed in no acute distress. Sitting comfortably on the exam table  NECK: No JVD  CARDIAC: RRR, no murmurs, rubs, gallops. Radial pulses 2+ bilaterally  RESPIRATORY:  Clear to auscultation without rales, wheezing or rhonchi. Normal WOB on room air   ABDOMEN: Soft, non-tender, non-distended EXTREMITIES:  No edema  in BLE; No deformity   ASSESSMENT AND PLAN: .    Chest Pain  - Patient reports having 2 episodes of chest pain recently.  On Saturday 3/15, she had walked for about an hour and a half.  After her walk, she had something to eat that upset her stomach.  She developed chest pain that was somewhat stabbing in quality. -She had a similar episode of chest pain on Sunday 3/16.  Chest pain was relieved by acid reflux medications -Patient is fairly active in her day-to-day life, walks 20-30 minutes/day.  Denies recent episodes of chest pain or shortness of breath on exertion.  She denies tobacco use, diabetes.  Does have history of high cholesterol.  Her mother had coronary artery disease. -Suspect that patient's chest pain was GI related as it improved with acid reflux medication.  However, patient does have family history of coronary artery disease and personal history of high cholesterol.  Has not had an ischemic evaluation since 2013 - EKG today is without ischemic changes  -Ordered a stress test to rule out ischemia.  Patient is able to walk on a treadmill  SVT PACs - Cardiac monitor in 02/2023 showed frequent short bursts of SVT, frequent PACs with 16% burden  - Now on metoprolol tartrate 12.5 mg BID  -Patient reports significant improvement in palpitations to starting metoprolol.  Continue metoprolol 12.5 mg twice daily  HLD  - Lipid panel from 09/2022 showed LDL 55, HDL 68, triglycerides 47, total cholesterol 134  - Continue simvastatin 40 mg daily   Mitral Valve Abnormality - Echocardiogram in 2017 showed somewhat flattened closure of the mitral valve without frank prolapse, only trivial MR  - No murmurs on exam. No symptoms or physical exam findings to suggest mitral valve regurg or stenosis    Informed Consent   Shared Decision Making/Informed Consent{  The risks [chest pain, shortness of breath, cardiac arrhythmias, dizziness, blood pressure fluctuations, myocardial infarction,  stroke/transient ischemic attack, nausea, vomiting, allergic reaction, radiation exposure, metallic taste sensation and life-threatening complications (estimated to be 1 in 10,000)], benefits (risk stratification, diagnosing coronary artery disease, treatment guidance) and alternatives of a nuclear stress test were discussed in detail with Ms. Mollica and she agrees to proceed.     Dispo: Patient is establishing care with Dr. Bjorn George in 07/2023.  Keep this appointment to establish care  Signed, Jonita Albee, PA-C

## 2023-07-14 ENCOUNTER — Ambulatory Visit: Attending: Cardiology | Admitting: Cardiology

## 2023-07-14 ENCOUNTER — Encounter: Payer: Self-pay | Admitting: Cardiology

## 2023-07-14 VITALS — BP 104/62 | HR 88 | Ht 63.0 in | Wt 150.6 lb

## 2023-07-14 DIAGNOSIS — I491 Atrial premature depolarization: Secondary | ICD-10-CM | POA: Diagnosis not present

## 2023-07-14 DIAGNOSIS — E7800 Pure hypercholesterolemia, unspecified: Secondary | ICD-10-CM

## 2023-07-14 DIAGNOSIS — R079 Chest pain, unspecified: Secondary | ICD-10-CM

## 2023-07-14 DIAGNOSIS — R002 Palpitations: Secondary | ICD-10-CM

## 2023-07-14 NOTE — Patient Instructions (Signed)
 Medication Instructions:  Hold Metoprolol Succinate ER the day of test *If you need a refill on your cardiac medications before your next appointment, please call your pharmacy*  Lab Work: No labs If you have labs (blood work) drawn today and your tests are completely normal, you will receive your results only by: MyChart Message (if you have MyChart) OR A paper copy in the mail If you have any lab test that is abnormal or we need to change your treatment, we will call you to review the results.  Testing/Procedures: Robet Leu PA-C has ordered a Myocardial Perfusion Imaging Study.   The test will take approximately 3 to 4 hours to complete; you may bring reading material.  If someone comes with you to your appointment, they will need to remain in the main lobby due to limited space in the testing area. **If you are pregnant or breastfeeding, please notify the nuclear lab prior to your appointment**  You will need to hold the following medications prior to your stress test: beta-blockers (24 hours prior to test)   How to prepare for your Myocardial Perfusion Test: Do not eat or drink 3 hours prior to your test, except you may have water. Do not consume products containing caffeine (regular or decaffeinated) 12 hours prior to your test. (ex: coffee, chocolate, sodas, tea). Do wear comfortable clothes (no dresses or overalls) and walking shoes, tennis shoes preferred (No heels or open toe shoes are allowed). Do NOT wear cologne, perfume, aftershave, or lotions (deodorant is allowed). If these instructions are not followed, your test will have to be rescheduled.   Follow-Up: At [facility], you and your health needs are our priority.  As part of our continuing mission to provide you with exceptional heart care, we have created designated Provider Care Teams.  These Care Teams include your primary Cardiologist (physician) and Advanced Practice Providers (APPs -  Physician  Assistants and Nurse Practitioners) who all work together to provide you with the care you need, when you need it.  We recommend signing up for the patient portal called "MyChart".  Sign up information is provided on this After Visit Summary.  MyChart is used to connect with patients for Virtual Visits (Telemedicine).  Patients are able to view lab/test results, encounter notes, upcoming appointments, etc.  Non-urgent messages can be sent to your provider as well.   To learn more about what you can do with MyChart, go to [email or web address].    Your next appointment:   Already scheduled

## 2023-07-17 ENCOUNTER — Telehealth (HOSPITAL_COMMUNITY): Payer: Self-pay | Admitting: *Deleted

## 2023-07-17 NOTE — Telephone Encounter (Signed)
 Patient given detailed instructions per Myocardial Perfusion Study Information Sheet for the test on 07/21/2023 at 7:45. Patient notified to arrive 15 minutes early and that it is imperative to arrive on time for appointment to keep from having the test rescheduled.  If you need to cancel or reschedule your appointment, please call the office within 24 hours of your appointment. . Patient verbalized understanding.Grace George

## 2023-07-20 ENCOUNTER — Ambulatory Visit (HOSPITAL_COMMUNITY): Attending: Cardiology

## 2023-07-20 DIAGNOSIS — R002 Palpitations: Secondary | ICD-10-CM

## 2023-07-20 DIAGNOSIS — R079 Chest pain, unspecified: Secondary | ICD-10-CM

## 2023-07-20 DIAGNOSIS — I491 Atrial premature depolarization: Secondary | ICD-10-CM

## 2023-07-20 LAB — MYOCARDIAL PERFUSION IMAGING
Angina Index: 0
Duke Treadmill Score: 5
Estimated workload: 7
Exercise duration (min): 5 min
Exercise duration (sec): 27 s
LV dias vol: 64 mL (ref 46–106)
LV sys vol: 23 mL
MPHR: 156 {beats}/min
Nuc Stress EF: 65 %
Peak HR: 164 {beats}/min
Percent HR: 105 %
Rest HR: 75 {beats}/min
Rest Nuclear Isotope Dose: 10.7 mCi
SDS: 3
SRS: 1
SSS: 4
ST Depression (mm): 0 mm
Stress Nuclear Isotope Dose: 32.7 mCi
TID: 0.92

## 2023-07-20 MED ORDER — TECHNETIUM TC 99M TETROFOSMIN IV KIT
32.7000 | PACK | Freq: Once | INTRAVENOUS | Status: AC | PRN
  Administered 2023-07-20: 32.7 via INTRAVENOUS

## 2023-07-20 MED ORDER — TECHNETIUM TC 99M TETROFOSMIN IV KIT
10.7000 | PACK | Freq: Once | INTRAVENOUS | Status: AC | PRN
  Administered 2023-07-20: 10.7 via INTRAVENOUS

## 2023-07-22 ENCOUNTER — Telehealth: Payer: Self-pay

## 2023-07-22 ENCOUNTER — Encounter (INDEPENDENT_AMBULATORY_CARE_PROVIDER_SITE_OTHER): Payer: 59 | Admitting: Ophthalmology

## 2023-07-22 NOTE — Telephone Encounter (Signed)
-----   Message from Jonita Albee sent at 07/22/2023  1:53 PM EDT ----- Please tell patient that overall, her stress test was a normal, low risk study. There was normal perfusion and normal LV function.   Her EKG did pick up PVCs (fast/extra heart beats from the bottom of the heart). Also picked up a brief episode of irregular tachycardia (fast HR). This may have been atrial fibrillation. Resolved quickly with rest. Her cardiac monitor from 02/2023 did not detect any atrial fibrillation. At this point, we have two options to better assess for atrial fibrillation.   Her first option would be to wear a 30 day monitor. This would allow [name] to track her heart rhythm over a longer period of time. If she is having afib, we would be able to confirm a diagnosis, see how often she is having afib, and see what her HR is when/if she does go into afib. We would get more and better quality data from this option, but I could understand her not wanting to wear a monitor a second time in a 5 month period.   The second option would be for her to get an exercise watch with EKG capabilities and try to wear the watch consistently. She also could get a kardia mobile device, which she could use whenever she feels palpitations.   Thanks [employer]

## 2023-07-22 NOTE — Telephone Encounter (Signed)
 Called patient advised of below they verbalized understanding Presented the patient with options they said they would like to wait to research their options and discuss with Dr. Bjorn Pippin at upcoming appointment

## 2023-07-29 ENCOUNTER — Encounter (INDEPENDENT_AMBULATORY_CARE_PROVIDER_SITE_OTHER): Admitting: Ophthalmology

## 2023-07-29 DIAGNOSIS — H43811 Vitreous degeneration, right eye: Secondary | ICD-10-CM | POA: Diagnosis not present

## 2023-07-29 DIAGNOSIS — H20012 Primary iridocyclitis, left eye: Secondary | ICD-10-CM

## 2023-07-29 DIAGNOSIS — H2513 Age-related nuclear cataract, bilateral: Secondary | ICD-10-CM

## 2023-07-29 DIAGNOSIS — H35342 Macular cyst, hole, or pseudohole, left eye: Secondary | ICD-10-CM | POA: Diagnosis not present

## 2023-07-31 ENCOUNTER — Ambulatory Visit: Attending: Cardiology

## 2023-07-31 ENCOUNTER — Encounter: Payer: Self-pay | Admitting: Cardiology

## 2023-07-31 ENCOUNTER — Ambulatory Visit: Attending: Cardiology | Admitting: Cardiology

## 2023-07-31 VITALS — BP 146/70 | HR 69 | Ht 63.0 in | Wt 152.2 lb

## 2023-07-31 DIAGNOSIS — I491 Atrial premature depolarization: Secondary | ICD-10-CM | POA: Diagnosis not present

## 2023-07-31 DIAGNOSIS — I471 Supraventricular tachycardia, unspecified: Secondary | ICD-10-CM

## 2023-07-31 DIAGNOSIS — E7800 Pure hypercholesterolemia, unspecified: Secondary | ICD-10-CM

## 2023-07-31 DIAGNOSIS — I10 Essential (primary) hypertension: Secondary | ICD-10-CM | POA: Diagnosis not present

## 2023-07-31 DIAGNOSIS — R079 Chest pain, unspecified: Secondary | ICD-10-CM

## 2023-07-31 MED ORDER — METOPROLOL SUCCINATE ER 25 MG PO TB24: 25.0000 mg | ORAL_TABLET | Freq: Every day | ORAL | 1 refills | Status: DC

## 2023-07-31 MED ORDER — SIMVASTATIN 40 MG PO TABS: 40.0000 mg | ORAL_TABLET | Freq: Every day | ORAL | 1 refills | Status: DC

## 2023-07-31 NOTE — Progress Notes (Unsigned)
 Enrolled patient for a 7 day Zio XT monitor to be mailed to patients home.

## 2023-07-31 NOTE — Patient Instructions (Signed)
 Medication Instructions:  Increase: Metoprolol succinate (Toprol XL) to 25 mg once daily  Lab Work: None  Testing/Procedures: Your physician has requested that you have an echocardiogram. Echocardiography is a painless test that uses sound waves to create images of your heart. It provides your doctor with information about the size and shape of your heart and how well your heart's chambers and valves are working. This procedure takes approximately one hour. There are no restrictions for this procedure. Please do NOT wear cologne, perfume, aftershave, or lotions (deodorant is allowed). Please arrive 15 minutes prior to your appointment time.   ZIO XT- Long Term Monitor Instructions  Your physician has requested you wear a ZIO patch monitor for 7 days.  This is a single patch monitor. [employer] supplies one patch monitor per enrollment. Additional stickers are not available. Please do not apply patch if you will be having a Nuclear Stress Test,  Echocardiogram, Cardiac CT, MRI, or Chest Xray during the period you would be wearing the  monitor. The patch cannot be worn during these tests. You cannot remove and re-apply the  ZIO XT patch monitor.  Your ZIO patch monitor will be mailed 3 day USPS to your address on file. It may take 3-5 days  to receive your monitor after you have been enrolled.  Once you have received your monitor, please review the enclosed instructions. Your monitor  has already been registered assigning a specific monitor serial # to you.  Billing and Patient Assistance Program Information  We have supplied [employer] with any of your insurance information on file for billing purposes. [employer] offers a sliding scale Patient Assistance Program for patients that do not have  insurance, or whose insurance does not completely cover the cost of the ZIO monitor.  You must apply for the Patient Assistance Program to qualify for this discounted rate.  To apply, please call [employer] at  [phone], select option 4, select option 2, ask to apply for  Patient Assistance Program. Meredeth Ide will ask your household income, and how many people  are in your household. They will quote your out-of-pocket cost based on that information.  [employer] will also be able to set up a 38-month, interest-free payment plan if needed.  Applying the monitor   Shave hair from upper left chest.  Hold abrader disc by orange tab. Rub abrader in 40 strokes over the upper left chest as  indicated in your monitor instructions.  Clean area with 4 enclosed alcohol pads. Let dry.  Apply patch as indicated in monitor instructions. Patch will be placed under collarbone on left  side of chest with arrow pointing upward.  Rub patch adhesive wings for 2 minutes. Remove white label marked "1". Remove the white  label marked "2". Rub patch adhesive wings for 2 additional minutes.  While looking in a mirror, press and release button in center of patch. A small green light will  flash 3-4 times. This will be your only indicator that the monitor has been turned on.  Do not shower for the first 24 hours. You may shower after the first 24 hours.  Press the button if you feel a symptom. You will hear a small click. Record Date, Time and  Symptom in the Patient Logbook.  When you are ready to remove the patch, follow instructions on the last 2 pages of Patient  Logbook. Stick patch monitor onto the last page of Patient Logbook.  Place Patient Logbook in the blue and white box. Use locking  tab on box and tape box closed  securely. The blue and white box has prepaid postage on it. Please place it in the mailbox as  soon as possible. Your physician should have your test results approximately 7 days after the  monitor has been mailed back to [place].  Call [facility] Customer Care at [phone] if you have questions regarding  your ZIO XT patch monitor. Call them immediately if you see an orange light  blinking on your  monitor.  If your monitor falls off in less than 4 days, contact our Monitor department at [phone].  If your monitor becomes loose or falls off after 4 days call [employer] at [phone] for  suggestions on securing your monitor   Follow-Up: At [facility], you and your health needs are our priority.  As part of our continuing mission to provide you with exceptional heart care, our providers are all part of one team.  This team includes your primary Cardiologist (physician) and Advanced Practice Providers or APPs (Physician Assistants and Nurse Practitioners) who all work together to provide you with the care you need, when you need it.  Your next appointment:   4 month(s)  Provider:   Epifanio Lesches, MD       Valet parking services will be available as well.

## 2023-07-31 NOTE — Progress Notes (Signed)
 Cardiology Office Note:    Date:  07/31/2023   ID:  Grace George, DOB 1961-02-09, MRN [id number]  PCP:  Myrlene Broker, MD  Cardiologist:  Nicki Guadalajara, MD  Electrophysiologist:  None   Referring MD: Myrlene Broker, *   Chief Complaint  Patient presents with   Chest Pain    History of Present Illness:    Grace George is a 63 y.o. female with a hx of PACs, hypertension, hyperlipidemia who presents for follow-up.  She is previously followed with Dr. Tresa Endo.  Underwent NST in 2013 that showed no evidence of ischemia.  Echocardiogram in 2017 showed EF 55%, G2 DD, no significant valvular disease.  Cardiac monitor 02/2023 showed frequent PACs (16% of beats), frequent supraventricular couplets (7%), and occasional supraventricular triplets (3.3%); also with occasional PVCs (1.8%).  Had 220 episodes of SVT, longest lasting 10 beats.  Exercise Myoview 07/20/2023 showed fair exercise capacity (7.0 METS), normal perfusion, low risk study but noted to have frequent PVCs during exercise.  Also noted to have brief episode of irregular SVT, suspected to be A-fib with RVR, lasted short duration.  Since last clinic visit, she reports she continues to have intermittent chest pain.  Reports pain on left side of chest that she describes as dull aching pain.  Occurs every 2 to 3 days, lasts for few minutes.  She walks for 30 minutes daily, has not noted chest pain while walking but does report has occurred when she has completed her walk.  Also states that it seems to occur when she is stressed.  Reports her palpitations have improved since starting metoprolol.   Past Medical History:  Diagnosis Date   Abnormal Pap smear    Allergic rhinitis    Allergy    Anemia    Anxiety    Dysrhythmia    palpiations per patient   GERD (gastroesophageal reflux disease)    History of shingles 04/2013   History of stress test 06/2011   Abnormal Myocadial perfusion scan demomstrating an attenuation defect  in the anterior region of the myocardium, No ischemia or infaract/scar is seenin the remaining myocardium. Excercise capacity 12 METS.   HTN (hypertension)    Hx of echocardiogram 2009   Showed normal systolic and diastolic function. she does have borderline mitral valve prolapse.   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   Post-operative nausea and vomiting     Past Surgical History:  Procedure Laterality Date   25 GAUGE PARS PLANA VITRECTOMY WITH 20 GAUGE MVR PORT FOR MACULAR HOLE Left 04/14/2023   Procedure: 25 GAUGE PARS PLANA VITRECTOMY WITH 20 GAUGE MVR PORT FOR MACULAR HOLE;  Surgeon: Sherrie George, MD;  Location: [facility] OR;  Service: Ophthalmology;  Laterality: Left;   CESAREAN SECTION     twice   COLONOSCOPY     CRYOABLATION     times 2 for abn paps   ESOPHAGOGASTRODUODENOSCOPY     GAS INSERTION Left 04/14/2023   Procedure: INSERTION OF  C3F8 GAS;  Surgeon: Sherrie George, MD;  Location: [facility] OR;  Service: Ophthalmology;  Laterality: Left;   GAS/FLUID EXCHANGE Left 04/14/2023   Procedure: GAS/FLUID EXCHANGE;  Surgeon: Sherrie George, MD;  Location: [facility] OR;  Service: Ophthalmology;  Laterality: Left;  HYSTEROSCOPY     LASER PHOTO ABLATION Left 04/14/2023   Procedure: LASER PHOTO ABLATION;  Surgeon: Sherrie George, MD;  Location: [facility] OR;  Service: Ophthalmology;  Laterality: Left;   LIGAMENT REPAIR Right 05/24/2020   Procedure: COLLATERAL LIGAMENT REPAIR BOTH SIDES;  Surgeon: Bjorn Pippin, MD;  Location: [facility];  Service: Orthopedics;  Laterality: Right;   PATELLAR TENDON REPAIR Right 05/24/2020   Procedure: PATELLA TENDON REPAIR;  Surgeon: Bjorn Pippin, MD;  Location: [facility];  Service: Orthopedics;  Laterality: Right;   PATELLECTOMY Right 05/24/2020   Procedure:  PATELLECTOMY;  Surgeon: Bjorn Pippin, MD;  Location: [facility];  Service: Orthopedics;  Laterality: Right;   WRIST SURGERY     right    Current Medications: Current Meds  Medication Sig   ALPRAZolam (XANAX) 0.5 MG tablet TAKE 1 TABLET (0.5 MG TOTAL) BY MOUTH 2 (TWO) TIMES DAILY AS NEEDED FOR ANXIETY.   Calcium Carb-Cholecalciferol (CALCIUM 600 + D PO) Take 1 tablet by mouth daily.   cyanocobalamin (VITAMIN B12) 1000 MCG tablet Take 1,000 mcg by mouth daily.   levocetirizine (XYZAL) 5 MG tablet TAKE 1 TABLET BY MOUTH EVERY DAY IN THE EVENING   pantoprazole (PROTONIX) 40 MG tablet Take 1 tablet (40 mg total) by mouth as needed.   prednisoLONE acetate (PRED FORTE) 1 % ophthalmic suspension Place 1 drop into the left eye 4 (four) times daily.   sodium chloride (OCEAN) 0.65 % SOLN nasal spray Place 1 spray into both nostrils as needed for congestion.   Wheat Dextrin (BENEFIBER) POWD Take 10 mLs by mouth daily.   [DISCONTINUED] metoprolol succinate (TOPROL XL) 25 MG 24 hr tablet Take 0.5 tablets (12.5 mg total) by mouth daily.   [DISCONTINUED] simvastatin (ZOCOR) 40 MG tablet TAKE 1 TABLET BY MOUTH EVERY DAY AT 6PM     Allergies:   Egg-derived products and Sulfa antibiotics   Social History   Socioeconomic History   Marital status: Married    Spouse name: Not on file   Number of children: 2   Years of education: Not on file   Highest education level: Not on file  Occupational History    Employer: [employer]  Tobacco Use   Smoking status: Never    Passive exposure: Never   Smokeless tobacco: Never  Substance and Sexual Activity   Alcohol use: No    Alcohol/week: 0.0 standard drinks of alcohol   Drug use: No   Sexual activity: Not Currently    Partners: Male    Birth control/protection: Post-menopausal  Other Topics Concern   Not on file  Social History Narrative   Married, 2 kids   Employed - [employer] - [employer] - in [place]   Never tobacco,  drugs   No EtOH      Are you right handed or left handed? Right Handed    Are you currently employed ? No    What is your current occupation? No    Do you live at home alone? No   Who lives with you? Lives with husband    What type of home do you live in: 1 story or 2 story? Lives in a 2 story home        Social Drivers of Health   Financial Resource Strain: Not on file  Food Insecurity: No Food Insecurity (04/14/2023)   Hunger Vital Sign    Worried About Running Out of Food in the  Last Year: Never true    Ran Out of Food in the Last Year: Never true  Transportation Needs: No Transportation Needs (04/14/2023)   PRAPARE - Administrator, Civil Service (Medical): No    Lack of Transportation (Non-Medical): No  Physical Activity: Not on file  Stress: Not on file  Social Connections: Not on file     Family History: The patient's family history includes Breast cancer in her mother; Congestive Heart Failure in her father; Diabetes in her mother and sister; Heart disease in her brother and mother; Hypertension in her mother and sister; Neuropathy in her father; Other in her brother; Prostate cancer in her father.  ROS:   Please see the history of present illness.     All other systems reviewed and are negative.  EKGs/Labs/Other Studies Reviewed:    The following studies were reviewed today:   EKG:   07/31/2023: Sinus rhythm with PACs specific T wave flattening, rate 69  Recent Labs: 10/24/2022: ALT 18 02/12/2023: Magnesium 2.1; TSH 3.860 04/14/2023: BUN 8; Creatinine, Ser 0.63; Hemoglobin 13.5; Platelets 177; Potassium 3.5; Sodium 140  Recent Lipid Panel    Component Value Date/Time   CHOL 134 10/24/2022 1126   TRIG 47 10/24/2022 1126   HDL 68 10/24/2022 1126   CHOLHDL 2.0 10/24/2022 1126   CHOLHDL 2 12/07/2018 0909   VLDL 10.4 12/07/2018 0909   LDLCALC 55 10/24/2022 1126    Physical Exam:    VS:  BP (!) 146/70   Pulse 69   Ht 5\' 3"  (1.6 m)   Wt 152 lb  3.2 oz (69 kg)   LMP 03/28/2010   SpO2 99%   BMI 26.96 kg/m     Wt Readings from Last 3 Encounters:  07/31/23 152 lb 3.2 oz (69 kg)  07/20/23 150 lb (68 kg)  07/14/23 150 lb 9.6 oz (68.3 kg)     GEN:  Well nourished, well developed in no acute distress HEENT: Normal NECK: No JVD; No carotid bruits LYMPHATICS: No lymphadenopathy CARDIAC: Irregular, normal rate, no murmurs, rubs, gallops RESPIRATORY:  Clear to auscultation without rales, wheezing or rhonchi  ABDOMEN: Soft, non-tender, non-distended MUSCULOSKELETAL:  No edema; No deformity  SKIN: Warm and dry NEUROLOGIC:  Alert and oriented x 3 PSYCHIATRIC:  Normal affect   ASSESSMENT:    1. Chest pain, unspecified type   2. PSVT (paroxysmal supraventricular tachycardia) (HCC)   3. PAC (premature atrial contraction)   4. Essential hypertension   5. Hypercholesterolemia    PLAN:    Chest pain: Atypical in description.  Exercise Myoview 07/20/2023 showed fair exercise capacity (7.0 METS), normal perfusion, low risk study but noted to have frequent PVCs during exercise.  Also noted to have brief episode of irregular SVT, suspected to be A-fib with RVR, lasted short duration.  SVT/frequent PACs: Cardiac monitor 02/2023 showed frequent PACs (16% of beats), frequent supraventricular couplets (7%), and occasional supraventricular triplets (3.3%); also with occasional PVCs (1.8%).  Had 220 episodes of SVT, longest lasting 10 beats. - Started on Toprol-XL 12.5 mg daily, will increase to Toprol-XL 25 mg daily - Recommend repeating monitor now that on metoprolol, will check Zio patch x 7 days.  She has Apple Watch, encouraged to use for longer-term monitoring - Update echocardiogram  Hyperlipidemia: LDL 55 on 09/2022.  On simvastatin 40 mg daily  RTC in 4 months  Medication Adjustments/Labs and Tests Ordered: Current medicines are reviewed at length with the patient today.  Concerns regarding medicines are outlined  above.  Orders  Placed This Encounter  Procedures   LONG TERM MONITOR (3-14 DAYS)   EKG 12-Lead   ECHOCARDIOGRAM COMPLETE   Meds ordered this encounter  Medications   simvastatin (ZOCOR) 40 MG tablet    Sig: Take 1 tablet (40 mg total) by mouth daily at 6 PM.    Dispense:  90 tablet    Refill:  1   metoprolol succinate (TOPROL XL) 25 MG 24 hr tablet    Sig: Take 1 tablet (25 mg total) by mouth daily.    Dispense:  90 tablet    Refill:  1    Patient Instructions  Medication Instructions:  Increase: Metoprolol succinate (Toprol XL) to 25 mg once daily  Lab Work: None  Testing/Procedures: Your physician has requested that you have an echocardiogram. Echocardiography is a painless test that uses sound waves to create images of your heart. It provides your doctor with information about the size and shape of your heart and how well your heart's chambers and valves are working. This procedure takes approximately one hour. There are no restrictions for this procedure. Please do NOT wear cologne, perfume, aftershave, or lotions (deodorant is allowed). Please arrive 15 minutes prior to your appointment time.   ZIO XT- Long Term Monitor Instructions  Your physician has requested you wear a ZIO patch monitor for 7 days.  This is a single patch monitor. [employer] supplies one patch monitor per enrollment. Additional stickers are not available. Please do not apply patch if you will be having a Nuclear Stress Test,  Echocardiogram, Cardiac CT, MRI, or Chest Xray during the period you would be wearing the  monitor. The patch cannot be worn during these tests. You cannot remove and re-apply the  ZIO XT patch monitor.  Your ZIO patch monitor will be mailed 3 day USPS to your address on file. It may take 3-5 days  to receive your monitor after you have been enrolled.  Once you have received your monitor, please review the enclosed instructions. Your monitor  has already been registered assigning a specific  monitor serial # to you.  Billing and Patient Assistance Program Information  We have supplied [employer] with any of your insurance information on file for billing purposes. [employer] offers a sliding scale Patient Assistance Program for patients that do not have  insurance, or whose insurance does not completely cover the cost of the ZIO monitor.  You must apply for the Patient Assistance Program to qualify for this discounted rate.  To apply, please call [employer] at [phone], select option 4, select option 2, ask to apply for  Patient Assistance Program. Meredeth Ide will ask your household income, and how many people  are in your household. They will quote your out-of-pocket cost based on that information.  [employer] will also be able to set up a 57-month, interest-free payment plan if needed.  Applying the monitor   Shave hair from upper left chest.  Hold abrader disc by orange tab. Rub abrader in 40 strokes over the upper left chest as  indicated in your monitor instructions.  Clean area with 4 enclosed alcohol pads. Let dry.  Apply patch as indicated in monitor instructions. Patch will be placed under collarbone on left  side of chest with arrow pointing upward.  Rub patch adhesive wings for 2 minutes. Remove white label marked "1". Remove the white  label marked "2". Rub patch adhesive wings for 2 additional minutes.  While looking in a mirror, press and release  button in center of patch. A small green light will  flash 3-4 times. This will be your only indicator that the monitor has been turned on.  Do not shower for the first 24 hours. You may shower after the first 24 hours.  Press the button if you feel a symptom. You will hear a small click. Record Date, Time and  Symptom in the Patient Logbook.  When you are ready to remove the patch, follow instructions on the last 2 pages of Patient  Logbook. Stick patch monitor onto the last page of Patient Logbook.  Place Patient Logbook in the  blue and white box. Use locking tab on box and tape box closed  securely. The blue and white box has prepaid postage on it. Please place it in the mailbox as  soon as possible. Your physician should have your test results approximately 7 days after the  monitor has been mailed back to [facility].  Call [facility] Customer Care at [phone] if you have questions regarding  your ZIO XT patch monitor. Call them immediately if you see an orange light blinking on your  monitor.  If your monitor falls off in less than 4 days, contact our Monitor department at [phone].  If your monitor becomes loose or falls off after 4 days call [employer] at [phone] for  suggestions on securing your monitor   Follow-Up: At [facility], you and your health needs are our priority.  As part of our continuing mission to provide you with exceptional heart care, our providers are all part of one team.  This team includes your primary Cardiologist (physician) and Advanced Practice Providers or APPs (Physician Assistants and Nurse Practitioners) who all work together to provide you with the care you need, when you need it.  Your next appointment:   4 month(s)  Provider:   Epifanio Lesches, MD       Valet parking services will be available as well.      Signed, Little Ishikawa, MD  07/31/2023 5:05 PM    [facility]

## 2023-08-02 IMAGING — DX DG CHEST 1V PORT
1 series · 1 of 1 positions shown · non-contrast
Comparison: July 30, 2015

CLINICAL DATA: Elevated blood pressure with anxiety and headache.

EXAM:
PORTABLE CHEST 1 VIEW

[chest ap]
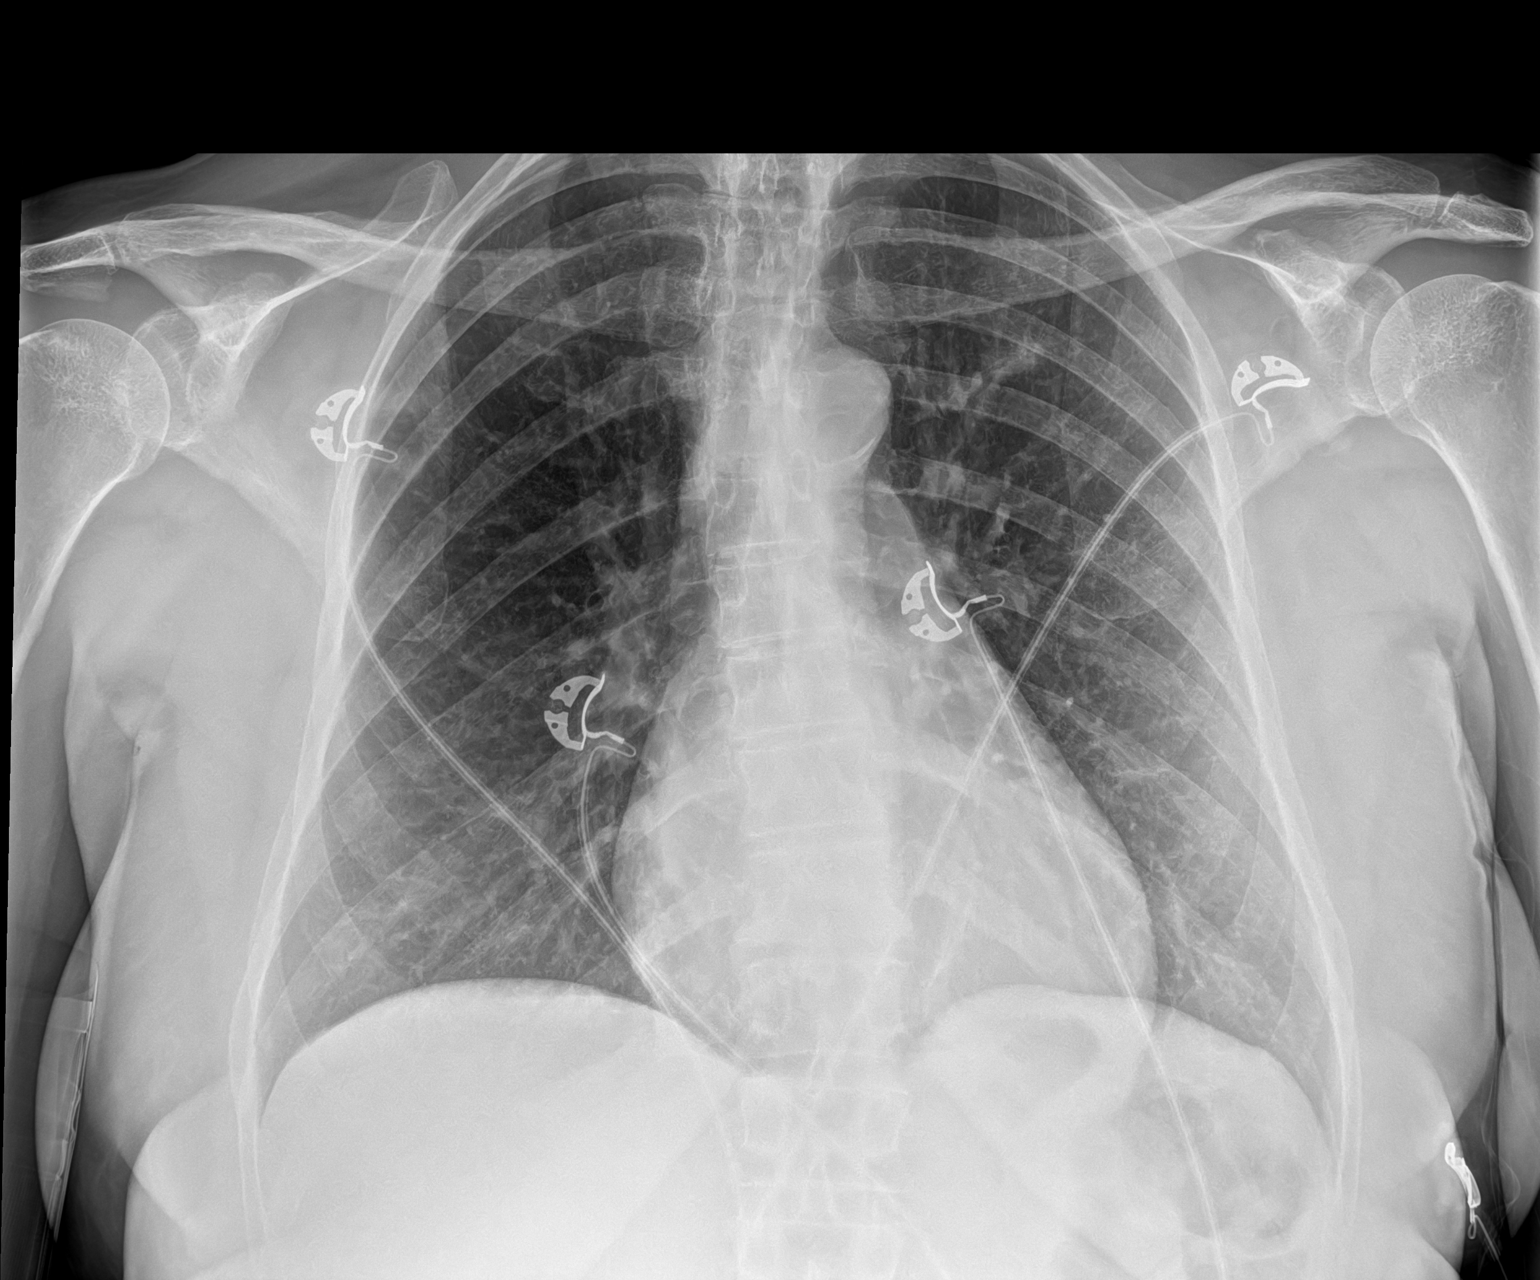

[1 of 1 positions shown; findings below may reference images not displayed]

FINDINGS: The heart size and mediastinal contours are within normal limits.
There is mild calcification of the aortic arch. Both lungs are
clear. The visualized skeletal structures are unremarkable.
IMPRESSION: No active disease.

## 2023-08-03 ENCOUNTER — Encounter: Payer: Self-pay | Admitting: Cardiology

## 2023-08-04 NOTE — Telephone Encounter (Signed)
 Recommend increasing to 1 whole pill a day of metoprolol and check BP and heart rate twice a day for 1 week and let us know results

## 2023-08-05 ENCOUNTER — Ambulatory Visit: Payer: Self-pay | Admitting: Cardiology

## 2023-08-05 NOTE — Telephone Encounter (Signed)
 Again, would recommend trying the Toprol-XL 25 mg daily.  With BP 111/62 there is plenty of room to add an extra 12.5 mg of metoprolol, metoprolol is not a very good blood pressure medicine and does not lower blood pressure very much.  She is on a very low dose of this medicine.  Recommend starting the Toprol-XL 25 mg daily and wearing her heart monitor as we discussed

## 2023-09-02 ENCOUNTER — Encounter (INDEPENDENT_AMBULATORY_CARE_PROVIDER_SITE_OTHER): Admitting: Ophthalmology

## 2023-09-02 ENCOUNTER — Telehealth: Payer: Self-pay | Admitting: *Deleted

## 2023-09-02 DIAGNOSIS — I471 Supraventricular tachycardia, unspecified: Secondary | ICD-10-CM | POA: Diagnosis not present

## 2023-09-02 NOTE — Telephone Encounter (Signed)
 The patient has been notified of the result and verbalized understanding.  All questions (if any) were answered.  Patient states would like for Dr Alda Amas to call her and discuss. She did except appointment but she ( Sounded tearful ) Patient stated she really would like to talk to Dr Alda Amas prior to the visit. Rn informed patient that Dr Alda Amas is not present where RN is located But RN will give him the message. Bebe Bourdon, RN 09/02/2023 2:41 PM

## 2023-09-02 NOTE — Telephone Encounter (Signed)
-----   Message from Grace George sent at 09/02/2023  6:33 AM EDT ----- New atrial flutter seen, also with low resting heart rate.  Can we add her on on 5/12 at 9:20 AM?

## 2023-09-02 NOTE — Telephone Encounter (Signed)
 See result note, new atrial flutter.  Recommend scheduling appointment with her at 920 on 5/12.  Okay to take 12.5 mg of metoprolol 

## 2023-09-02 NOTE — Telephone Encounter (Signed)
 I spoke with patient, discussed atrial flutter seen briefly on monitor and will plan for appointment on Monday

## 2023-09-06 NOTE — Progress Notes (Unsigned)
 Cardiology Office Note:    Date:  09/07/2023   ID:  [name], DOB Sep 21, 1960, MRN [id number]  PCP:  Adelia Homestead, MD  Cardiologist:  Magnus Schuller, MD  Electrophysiologist:  None   Referring MD: Adelia Homestead, *   Chief Complaint  Patient presents with   Atrial Flutter    History of Present Illness:    Grace George is a 63 y.o. female with a hx of PACs, hypertension, hyperlipidemia who presents for follow-up.  She is previously followed with Dr. Loetta Ringer.  Underwent NST in 2013 that showed no evidence of ischemia.  Echocardiogram in 2017 showed EF 55%, G2 DD, no significant valvular disease.  Cardiac monitor 02/2023 showed frequent PACs (16% of beats), frequent supraventricular couplets (7%), and occasional supraventricular triplets (3.3%); also with occasional PVCs (1.8%).  Had 220 episodes of SVT, longest lasting 10 beats.  Exercise Myoview  07/20/2023 showed fair exercise capacity (7.0 METS), normal perfusion, low risk study but noted to have frequent PVCs during exercise.  Also noted to have brief episode of irregular SVT, suspected to be A-fib with RVR, lasted short duration.  Zio patch x 7 days 07/2023 showed less than 1% atrial flutter burden with longest episode lasting 1 minute 35 seconds with average rate 80 bpm, 10 episodes of SVT with longest episode lasting 25 seconds, frequent PACs (10% of beats), occasional PVCs (4% of beats).  Echocardiogram 09/07/2023 shows EF 60 to 65%, normal RV function, mild left atrial enlargement, no significant valvular disease.  Since last clinic visit, she reports she is doing well.  Denies any chest pain or dyspnea.  Continues to have intermittent palpitations.  Felt more tired on Toprol -XL 25 mg daily, feels better since back on 12.5 mg.  She denies any bleeding issues.   Past Medical History:  Diagnosis Date   Abnormal Pap smear    Allergic rhinitis    Allergy    Anemia    Anxiety    Dysrhythmia    palpiations per patient    GERD (gastroesophageal reflux disease)    History of shingles 04/2013   History of stress test 06/2011   Abnormal Myocadial perfusion scan demomstrating an attenuation defect in the anterior region of the myocardium, No ischemia or infaract/scar is seenin the remaining myocardium. Excercise capacity 12 METS.   HTN (hypertension)    Hx of echocardiogram 2009   Showed normal systolic and diastolic function. she does have borderline mitral valve prolapse.   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   Post-operative nausea and vomiting     Past Surgical History:  Procedure Laterality Date   25 GAUGE PARS PLANA VITRECTOMY WITH 20 GAUGE MVR PORT FOR MACULAR HOLE Left 04/14/2023   Procedure: 25 GAUGE PARS PLANA VITRECTOMY WITH 20 GAUGE MVR PORT FOR MACULAR HOLE;  Surgeon: Rexene Catching, MD;  Location: [facility] OR;  Service: Ophthalmology;  Laterality: Left;   CESAREAN SECTION     twice   COLONOSCOPY     CRYOABLATION     times 2 for abn paps   ESOPHAGOGASTRODUODENOSCOPY     GAS INSERTION Left 04/14/2023   Procedure: INSERTION OF  C3F8 GAS;  Surgeon: Rexene Catching, MD;  Location: [facility] OR;  Service: Ophthalmology;  Laterality: Left;  GAS/FLUID EXCHANGE Left 04/14/2023   Procedure: GAS/FLUID EXCHANGE;  Surgeon: Rexene Catching, MD;  Location: [facility] OR;  Service: Ophthalmology;  Laterality: Left;   HYSTEROSCOPY     LASER PHOTO ABLATION Left 04/14/2023   Procedure: LASER PHOTO ABLATION;  Surgeon: Rexene Catching, MD;  Location: [facility] OR;  Service: Ophthalmology;  Laterality: Left;   LIGAMENT REPAIR Right 05/24/2020   Procedure: COLLATERAL LIGAMENT REPAIR BOTH SIDES;  Surgeon: Micheline Ahr, MD;  Location: [facility];  Service: Orthopedics;  Laterality: Right;   PATELLAR TENDON REPAIR Right 05/24/2020   Procedure:  PATELLA TENDON REPAIR;  Surgeon: Micheline Ahr, MD;  Location: [facility];  Service: Orthopedics;  Laterality: Right;   PATELLECTOMY Right 05/24/2020   Procedure: PATELLECTOMY;  Surgeon: Micheline Ahr, MD;  Location: [facility];  Service: Orthopedics;  Laterality: Right;   WRIST SURGERY     right    Current Medications: Current Meds  Medication Sig   ALPRAZolam  (XANAX ) 0.5 MG tablet TAKE 1 TABLET (0.5 MG TOTAL) BY MOUTH 2 (TWO) TIMES DAILY AS NEEDED FOR ANXIETY.   apixaban (ELIQUIS) 5 MG TABS tablet Take 1 tablet (5 mg total) by mouth 2 (two) times daily.   Calcium  Carb-Cholecalciferol  (CALCIUM  600 + D PO) Take 1 tablet by mouth daily.   cyanocobalamin  (VITAMIN B12) 1000 MCG tablet Take 1,000 mcg by mouth daily.   levocetirizine (XYZAL ) 5 MG tablet TAKE 1 TABLET BY MOUTH EVERY DAY IN THE EVENING   metoprolol  succinate (TOPROL  XL) 25 MG 24 hr tablet Take 0.5 tablets (12.5 mg total) by mouth daily.   pantoprazole  (PROTONIX ) 40 MG tablet Take 1 tablet (40 mg total) by mouth as needed.   prednisoLONE  acetate (PRED FORTE ) 1 % ophthalmic suspension Place 1 drop into the left eye 4 (four) times daily.   simvastatin  (ZOCOR ) 40 MG tablet Take 1 tablet (40 mg total) by mouth daily at 6 PM.   sodium chloride  (OCEAN) 0.65 % SOLN nasal spray Place 1 spray into both nostrils as needed for congestion.   Wheat Dextrin (BENEFIBER) POWD Take 10 mLs by mouth daily.   [DISCONTINUED] metoprolol  succinate (TOPROL  XL) 25 MG 24 hr tablet Take 1 tablet (25 mg total) by mouth daily.     Allergies:   Egg-derived products and Sulfa antibiotics   Social History   Socioeconomic History   Marital status: Married    Spouse name: Not on file   Number of children: 2   Years of education: Not on file   Highest education level: Not on file  Occupational History    Employer: [employer]  Tobacco Use   Smoking status: Never    Passive exposure: Never   Smokeless tobacco: Never   Substance and Sexual Activity   Alcohol use: No    Alcohol/week: 0.0 standard drinks of alcohol   Drug use: No   Sexual activity: Not Currently    Partners: Male    Birth control/protection: Post-menopausal  Other Topics Concern   Not on file  Social History Narrative   Married, 2 kids   Employed - [employer] - in [place]   Never tobacco, drugs   No EtOH      Are you right handed or left handed? Right Handed    Are you currently employed ? No    What is your current occupation? No    Do you live at home alone? No   Who lives  with you? Lives with husband    What type of home do you live in: 1 story or 2 story? Lives in a 2 story home        Social Drivers of Health   Financial Resource Strain: Not on file  Food Insecurity: No Food Insecurity (04/14/2023)   Hunger Vital Sign    Worried About Running Out of Food in the Last Year: Never true    Ran Out of Food in the Last Year: Never true  Transportation Needs: No Transportation Needs (04/14/2023)   PRAPARE - Administrator, Civil Service (Medical): No    Lack of Transportation (Non-Medical): No  Physical Activity: Not on file  Stress: Not on file  Social Connections: Not on file     Family History: The patient's family history includes Breast cancer in her mother; Congestive Heart Failure in her father; Diabetes in her mother and sister; Heart disease in her brother and mother; Hypertension in her mother and sister; Neuropathy in her father; Other in her brother; Prostate cancer in her father.  ROS:   Please see the history of present illness.     All other systems reviewed and are negative.  EKGs/Labs/Other Studies Reviewed:    The following studies were reviewed today:   EKG:   07/31/2023: Sinus rhythm with PACs specific T wave flattening, rate 69  Recent Labs: 10/24/2022: ALT 18 02/12/2023: Magnesium  2.1; TSH 3.860 04/14/2023: BUN 8; Creatinine, Ser 0.63; Hemoglobin 13.5; Platelets  177; Potassium 3.5; Sodium 140  Recent Lipid Panel    Component Value Date/Time   CHOL 134 10/24/2022 1126   TRIG 47 10/24/2022 1126   HDL 68 10/24/2022 1126   CHOLHDL 2.0 10/24/2022 1126   CHOLHDL 2 12/07/2018 0909   VLDL 10.4 12/07/2018 0909   LDLCALC 55 10/24/2022 1126    Physical Exam:    VS:  BP (!) 148/78 (BP Location: Left Arm, Patient Position: Sitting)   Pulse (!) 53   Ht 5\' 3"  (1.6 m)   Wt 150 lb 12.8 oz (68.4 kg)   LMP 03/28/2010   SpO2 99%   BMI 26.71 kg/m     Wt Readings from Last 3 Encounters:  09/07/23 150 lb 12.8 oz (68.4 kg)  07/31/23 152 lb 3.2 oz (69 kg)  07/20/23 150 lb (68 kg)     GEN:  Well nourished, well developed in no acute distress HEENT: Normal NECK: No JVD; No carotid bruits LYMPHATICS: No lymphadenopathy CARDIAC: Irregular, normal rate, no murmurs, rubs, gallops RESPIRATORY:  Clear to auscultation without rales, wheezing or rhonchi  ABDOMEN: Soft, non-tender, non-distended MUSCULOSKELETAL:  No edema; No deformity  SKIN: Warm and dry NEUROLOGIC:  Alert and oriented x 3 PSYCHIATRIC:  Normal affect   ASSESSMENT:    1. Atrial flutter, unspecified type (HCC)   2. Essential hypertension   3. Hypercholesterolemia   4. SVT (supraventricular tachycardia) (HCC)   5. Chest pain, unspecified type     PLAN:    Atrial flutter: Zio patch x 7 days 07/2023 showed less than 1% atrial flutter burden with longest episode lasting 1 minute 35 seconds with average rate 80 bpm, 10 episodes of SVT with longest episode lasting 25 seconds, frequent PACs (10% of beats), occasional PVCs (4% of beats).  CHA2DS2-VASc 2 (hypertension, female).  Echocardiogram 09/07/2023 shows EF 60 to 65%, normal RV function, mild left atrial enlargement, no significant valvular disease. - Start Eliquis 5 mg twice daily -Continue toprol  XL 12.5 mg daily  Chest  pain: Atypical in description.  Exercise Myoview  07/20/2023 showed fair exercise capacity (7.0 METS), normal perfusion, low  risk study but noted to have frequent PVCs during exercise.  Also noted to have brief episode of irregular SVT, suspected to be A-fib with RVR, lasted short duration.  SVT/frequent PACs: Cardiac monitor 02/2023 showed frequent PACs (16% of beats), frequent supraventricular couplets (7%), and occasional supraventricular triplets (3.3%); also with occasional PVCs (1.8%).  Had 220 episodes of SVT, longest lasting 10 beats.  Echocardiogram 09/07/2023 shows EF 60 to 65%, normal RV function, mild left atrial enlargement, no significant valvular disease. - Started on Toprol -XL 12.5 mg daily, increased to 25 mg daily but reports did not tolerate, now on 12.5 mg daily  Hyperlipidemia: LDL 55 on 09/2022.  On simvastatin  40 mg daily  Hypertension: On Toprol -XL 12.5 mg daily.  BP elevated in clinic today but reports has been under good control at home.  Asked to check BP daily for next week and let us  know results  RTC in 6 months  Medication Adjustments/Labs and Tests Ordered: Current medicines are reviewed at length with the patient today.  Concerns regarding medicines are outlined above.  Orders Placed This Encounter  Procedures   Lipid panel   Basic Metabolic Panel (BMET)   CBC w/Diff/Platelet   Meds ordered this encounter  Medications   apixaban (ELIQUIS) 5 MG TABS tablet    Sig: Take 1 tablet (5 mg total) by mouth 2 (two) times daily.    Dispense:  90 tablet    Refill:  3   metoprolol  succinate (TOPROL  XL) 25 MG 24 hr tablet    Sig: Take 0.5 tablets (12.5 mg total) by mouth daily.    Dispense:  90 tablet    Refill:  3    Patient Instructions  Medication Instructions:  Start Eliquis 5mg  twice a day Decrease Toprol  to 12.5 mg daily *If you need a refill on your cardiac medications before your next appointment, please call your pharmacy*  Lab Work: Lipid, cbc, bmet today If you have labs (blood work) drawn today and your tests are completely normal, you will receive your results only  by: MyChart Message (if you have MyChart) OR A paper copy in the mail If you have any lab test that is abnormal or we need to change your treatment, we will call you to review the results.  Testing/Procedures: none  Follow-Up: At [facility], you and your health needs are our priority.  As part of our continuing mission to provide you with exceptional heart care, our providers are all part of one team.  This team includes your primary Cardiologist (physician) and Advanced Practice Providers or APPs (Physician Assistants and Nurse Practitioners) who all work together to provide you with the care you need, when you need it.  Your next appointment:   Scheduled 8/5  @ 9:00  Provider:   Dr. Alda Amas  We recommend signing up for the patient portal called "MyChart".  Sign up information is provided on this After Visit Summary.  MyChart is used to connect with patients for Virtual Visits (Telemedicine).  Patients are able to view lab/test results, encounter notes, upcoming appointments, etc.  Non-urgent messages can be sent to your provider as well.   To learn more about what you can do with MyChart, go to [email or web address].   Other Instructions Check blood pressure once a day for one week send those reading via mychart       Signed, Wendie Hamburg, MD  09/07/2023 2:42 PM    [facility]

## 2023-09-07 ENCOUNTER — Encounter: Payer: Self-pay | Admitting: Cardiology

## 2023-09-07 ENCOUNTER — Ambulatory Visit (HOSPITAL_COMMUNITY): Attending: Cardiology

## 2023-09-07 ENCOUNTER — Ambulatory Visit: Admitting: Cardiology

## 2023-09-07 VITALS — BP 148/78 | HR 53 | Ht 63.0 in | Wt 150.8 lb

## 2023-09-07 DIAGNOSIS — E7800 Pure hypercholesterolemia, unspecified: Secondary | ICD-10-CM | POA: Diagnosis not present

## 2023-09-07 DIAGNOSIS — I471 Supraventricular tachycardia, unspecified: Secondary | ICD-10-CM | POA: Diagnosis not present

## 2023-09-07 DIAGNOSIS — I10 Essential (primary) hypertension: Secondary | ICD-10-CM | POA: Insufficient documentation

## 2023-09-07 DIAGNOSIS — I4892 Unspecified atrial flutter: Secondary | ICD-10-CM | POA: Insufficient documentation

## 2023-09-07 DIAGNOSIS — R079 Chest pain, unspecified: Secondary | ICD-10-CM

## 2023-09-07 DIAGNOSIS — I491 Atrial premature depolarization: Secondary | ICD-10-CM | POA: Diagnosis present

## 2023-09-07 LAB — ECHOCARDIOGRAM COMPLETE
Area-P 1/2: 3.98 cm2
S' Lateral: 2.8 cm

## 2023-09-07 MED ORDER — APIXABAN 5 MG PO TABS: 5.0000 mg | ORAL_TABLET | Freq: Two times a day (BID) | ORAL | 3 refills | Status: AC

## 2023-09-07 MED ORDER — METOPROLOL SUCCINATE ER 25 MG PO TB24: 12.5000 mg | ORAL_TABLET | Freq: Every day | ORAL | 3 refills | Status: DC

## 2023-09-07 MED ORDER — APIXABAN 5 MG PO TABS: 5.0000 mg | ORAL_TABLET | Freq: Two times a day (BID) | ORAL | 3 refills | Status: DC

## 2023-09-07 NOTE — Patient Instructions (Addendum)
 Medication Instructions:  Start Eliquis 5mg  twice a day Decrease Toprol  to 12.5 mg daily *If you need a refill on your cardiac medications before your next appointment, please call your pharmacy*  Lab Work: Lipid, cbc, bmet today If you have labs (blood work) drawn today and your tests are completely normal, you will receive your results only by: MyChart Message (if you have MyChart) OR A paper copy in the mail If you have any lab test that is abnormal or we need to change your treatment, we will call you to review the results.  Testing/Procedures: none  Follow-Up: At [facility], you and your health needs are our priority.  As part of our continuing mission to provide you with exceptional heart care, our providers are all part of one team.  This team includes your primary Cardiologist (physician) and Advanced Practice Providers or APPs (Physician Assistants and Nurse Practitioners) who all work together to provide you with the care you need, when you need it.  Your next appointment:   Scheduled 8/5  @ 9:00  Provider:   Dr. Alda Amas  We recommend signing up for the patient portal called "MyChart".  Sign up information is provided on this After Visit Summary.  MyChart is used to connect with patients for Virtual Visits (Telemedicine).  Patients are able to view lab/test results, encounter notes, upcoming appointments, etc.  Non-urgent messages can be sent to your provider as well.   To learn more about what you can do with MyChart, go to [email or web address].   Other Instructions Check blood pressure once a day for one week send those reading via mychart

## 2023-09-08 ENCOUNTER — Ambulatory Visit: Payer: Self-pay | Admitting: *Deleted

## 2023-09-08 LAB — LIPID PANEL
Chol/HDL Ratio: 2.1 ratio (ref 0.0–4.4)
Cholesterol, Total: 156 mg/dL (ref 100–199)
HDL: 74 mg/dL (ref >39)
LDL Chol Calc (NIH): 70 mg/dL (ref 0–99)
Triglycerides: 61 mg/dL (ref 0–149)
VLDL Cholesterol Cal: 12 mg/dL (ref 5–40)

## 2023-09-08 LAB — CBC WITH DIFFERENTIAL/PLATELET
Basophils Absolute: 0 10*3/uL (ref 0.0–0.2)
Basos: 1 %
EOS (ABSOLUTE): 0.1 10*3/uL (ref 0.0–0.4)
Eos: 1 %
Hematocrit: 43.4 % (ref 34.0–46.6)
Hemoglobin: 13.5 g/dL (ref 11.1–15.9)
Immature Grans (Abs): 0 10*3/uL (ref 0.0–0.1)
Immature Granulocytes: 0 %
Lymphocytes Absolute: 1 10*3/uL (ref 0.7–3.1)
Lymphs: 24 %
MCH: 29.2 pg (ref 26.6–33.0)
MCHC: 31.1 g/dL — ABNORMAL LOW (ref 31.5–35.7)
MCV: 94 fL (ref 79–97)
Monocytes Absolute: 0.2 10*3/uL (ref 0.1–0.9)
Monocytes: 6 %
Neutrophils Absolute: 2.9 10*3/uL (ref 1.4–7.0)
Neutrophils: 68 %
Platelets: 152 10*3/uL (ref 150–450)
RBC: 4.63 x10E6/uL (ref 3.77–5.28)
RDW: 12.7 % (ref 11.7–15.4)
WBC: 4.3 10*3/uL (ref 3.4–10.8)

## 2023-09-08 LAB — BASIC METABOLIC PANEL WITH GFR
BUN/Creatinine Ratio: 13 (ref 12–28)
BUN: 9 mg/dL (ref 8–27)
CO2: 21 mmol/L (ref 20–29)
Calcium: 10.4 mg/dL — ABNORMAL HIGH (ref 8.7–10.3)
Chloride: 105 mmol/L (ref 96–106)
Creatinine, Ser: 0.71 mg/dL (ref 0.57–1.00)
Glucose: 90 mg/dL (ref 70–99)
Potassium: 4.3 mmol/L (ref 3.5–5.2)
Sodium: 144 mmol/L (ref 134–144)
eGFR: 95 mL/min/{1.73_m2} (ref >59)

## 2023-09-09 ENCOUNTER — Encounter (INDEPENDENT_AMBULATORY_CARE_PROVIDER_SITE_OTHER): Admitting: Ophthalmology

## 2023-09-09 DIAGNOSIS — I10 Essential (primary) hypertension: Secondary | ICD-10-CM

## 2023-09-09 DIAGNOSIS — H43811 Vitreous degeneration, right eye: Secondary | ICD-10-CM

## 2023-09-09 DIAGNOSIS — H35033 Hypertensive retinopathy, bilateral: Secondary | ICD-10-CM | POA: Diagnosis not present

## 2023-09-10 NOTE — Telephone Encounter (Signed)
 Called and spoke to patient.  Informed patient that it is important to take Eliquis twice a day to be effected.  Eliqius has little to do with heart rate or blood pressure.   Patient states she looked up medication- it was written can effect pulse , blood pressure and she was going to call the pharmacy , but decide to send a Message to Dr Alda Amas.  Patient states she is getting  heart rate from smartwatch , tells her the  pulse.  RN  explained again the importance of taking Eliquis twice a day. Patient is aware the message will sent Dr Alda Amas for a response.    She verbalized understanding to take Eliquis twice a day.

## 2023-09-13 NOTE — Telephone Encounter (Signed)
 Agree, she needs to Eliquis  twice daily, will not lower heart rate

## 2023-09-24 ENCOUNTER — Encounter: Payer: Self-pay | Admitting: Internal Medicine

## 2023-09-24 ENCOUNTER — Telehealth: Payer: Self-pay | Admitting: Internal Medicine

## 2023-09-24 DIAGNOSIS — L659 Nonscarring hair loss, unspecified: Secondary | ICD-10-CM

## 2023-09-24 NOTE — Telephone Encounter (Signed)
 Copied from CRM [id number]. Topic: Clinical - Request for Lab/Test Order >> Sep 24, 2023  9:15 AM Freya Jesus wrote: Reason for CRM: Patient is requesting lab work for vitamin D . Requesting a phone call to schedule once order is placed.

## 2023-09-29 ENCOUNTER — Other Ambulatory Visit (INDEPENDENT_AMBULATORY_CARE_PROVIDER_SITE_OTHER)

## 2023-09-29 DIAGNOSIS — L659 Nonscarring hair loss, unspecified: Secondary | ICD-10-CM | POA: Diagnosis not present

## 2023-09-29 LAB — VITAMIN D 25 HYDROXY (VIT D DEFICIENCY, FRACTURES): VITD: 32.73 ng/mL (ref 30.00–100.00)

## 2023-09-30 ENCOUNTER — Ambulatory Visit: Payer: Self-pay | Admitting: Internal Medicine

## 2023-10-22 ENCOUNTER — Other Ambulatory Visit: Payer: Self-pay | Admitting: Internal Medicine

## 2023-10-22 DIAGNOSIS — J309 Allergic rhinitis, unspecified: Secondary | ICD-10-CM

## 2023-10-29 ENCOUNTER — Encounter: Payer: Self-pay | Admitting: Cardiology

## 2023-11-27 LAB — HM MAMMOGRAPHY

## 2023-11-29 NOTE — Progress Notes (Unsigned)
 Cardiology Office Note:    Date:  12/01/2023   ID:  Grace George, DOB 1960/05/11, MRN [id number]  PCP:  Rollene Almarie LABOR, MD  Cardiologist:  Debby Sor, MD (Inactive)  Electrophysiologist:  None   Referring MD: Rollene Almarie LABOR, *   Chief Complaint  Patient presents with   Atrial Flutter    History of Present Illness:    Grace George is a 63 y.o. female with a hx of PACs, hypertension, hyperlipidemia who presents for follow-up.  She is previously followed with Dr. Sor.  Underwent NST in 2013 that showed no evidence of ischemia.  Echocardiogram in 2017 showed EF 55%, G2 DD, no significant valvular disease.  Cardiac monitor 02/2023 showed frequent PACs (16% of beats), frequent supraventricular couplets (7%), and occasional supraventricular triplets (3.3%); also with occasional PVCs (1.8%).  Had 220 episodes of SVT, longest lasting 10 beats.  Exercise Myoview  07/20/2023 showed fair exercise capacity (7.0 METS), normal perfusion, low risk study but noted to have frequent PVCs during exercise.  Also noted to have brief episode of irregular SVT, suspected to be A-fib with RVR, lasted short duration.  Zio patch x 7 days 07/2023 showed less than 1% atrial flutter burden with longest episode lasting 1 minute 35 seconds with average rate 80 bpm, 10 episodes of SVT with longest episode lasting 25 seconds, frequent PACs (10% of beats), occasional PVCs (4% of beats).  Echocardiogram 09/07/2023 shows EF 60 to 65%, normal RV function, mild left atrial enlargement, no significant valvular disease.  Since last clinic visit, she is doing okay.  Reports palpitations have improved.  Reports lightheadedness but denies any syncope.  Denies any chest pain, dyspnea, lower extremity edema.  Reports BP 100s over 60s when checked at home.  She is taking Eliquis , denies any bleeding issues.   Past Medical History:  Diagnosis Date   Abnormal Pap smear    Allergic rhinitis    Allergy    Anemia     Anxiety    Dysrhythmia    palpiations per patient   GERD (gastroesophageal reflux disease)    History of shingles 04/2013   History of stress test 06/2011   Abnormal Myocadial perfusion scan demomstrating an attenuation defect in the anterior region of the myocardium, No ischemia or infaract/scar is seenin the remaining myocardium. Excercise capacity 12 METS.   HTN (hypertension)    Hx of echocardiogram 2009   Showed normal systolic and diastolic function. she does have borderline mitral valve prolapse.   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   Post-operative nausea and vomiting     Past Surgical History:  Procedure Laterality Date   25 GAUGE PARS PLANA VITRECTOMY WITH 20 GAUGE MVR PORT FOR MACULAR HOLE Left 04/14/2023   Procedure: 25 GAUGE PARS PLANA VITRECTOMY WITH 20 GAUGE MVR PORT FOR MACULAR HOLE;  Surgeon: Alvia Norleen BIRCH, MD;  Location: [facility] OR;  Service: Ophthalmology;  Laterality: Left;   CESAREAN SECTION     twice   COLONOSCOPY     CRYOABLATION     times 2 for abn paps   ESOPHAGOGASTRODUODENOSCOPY     GAS INSERTION Left 04/14/2023   Procedure: INSERTION OF  C3F8 GAS;  Surgeon: Alvia Norleen BIRCH, MD;  Location: [facility] OR;  Service: Ophthalmology;  Laterality: Left;   GAS/FLUID EXCHANGE Left 04/14/2023   Procedure: GAS/FLUID EXCHANGE;  Surgeon: Alvia Norleen BIRCH, MD;  Location: [facility] OR;  Service: Ophthalmology;  Laterality: Left;   HYSTEROSCOPY     LASER PHOTO ABLATION Left 04/14/2023   Procedure: LASER PHOTO ABLATION;  Surgeon: Alvia Norleen BIRCH, MD;  Location: [facility] OR;  Service: Ophthalmology;  Laterality: Left;   LIGAMENT REPAIR Right 05/24/2020   Procedure: COLLATERAL LIGAMENT REPAIR BOTH SIDES;  Surgeon: Cristy Bonner DASEN, MD;  Location: [facility];  Service: Orthopedics;  Laterality: Right;    PATELLAR TENDON REPAIR Right 05/24/2020   Procedure: PATELLA TENDON REPAIR;  Surgeon: Cristy Bonner DASEN, MD;  Location: [facility];  Service: Orthopedics;  Laterality: Right;   PATELLECTOMY Right 05/24/2020   Procedure: PATELLECTOMY;  Surgeon: Cristy Bonner DASEN, MD;  Location: [facility];  Service: Orthopedics;  Laterality: Right;   WRIST SURGERY     right    Current Medications: Current Meds  Medication Sig   ALPRAZolam  (XANAX ) 0.5 MG tablet TAKE 1 TABLET (0.5 MG TOTAL) BY MOUTH 2 (TWO) TIMES DAILY AS NEEDED FOR ANXIETY.   apixaban  (ELIQUIS ) 5 MG TABS tablet Take 1 tablet (5 mg total) by mouth 2 (two) times daily.   Calcium  Carb-Cholecalciferol  (CALCIUM  600 + D PO) Take 1 tablet by mouth daily.   cyanocobalamin  (VITAMIN B12) 1000 MCG tablet Take 1,000 mcg by mouth daily.   levocetirizine (XYZAL ) 5 MG tablet TAKE 1 TABLET BY MOUTH EVERY DAY IN THE EVENING   metoprolol  succinate (TOPROL  XL) 25 MG 24 hr tablet Take 0.5 tablets (12.5 mg total) by mouth daily.   pantoprazole  (PROTONIX ) 40 MG tablet Take 1 tablet (40 mg total) by mouth as needed.   prednisoLONE  acetate (PRED FORTE ) 1 % ophthalmic suspension Place 1 drop into the left eye 4 (four) times daily.   simvastatin  (ZOCOR ) 40 MG tablet Take 1 tablet (40 mg total) by mouth daily at 6 PM.   Wheat Dextrin (BENEFIBER) POWD Take 10 mLs by mouth daily.     Allergies:   Egg-derived products and Sulfa antibiotics   Social History   Socioeconomic History   Marital status: Married    Spouse name: Not on file   Number of children: 2   Years of education: Not on file   Highest education level: Not on file  Occupational History    Employer: [employer]  Tobacco Use   Smoking status: Never    Passive exposure: Never   Smokeless tobacco: Never  Substance and Sexual Activity   Alcohol use: No    Alcohol/week: 0.0 standard drinks of alcohol   Drug use: No   Sexual activity: Not Currently    Partners: Male     Birth control/protection: Post-menopausal  Other Topics Concern   Not on file  Social History Narrative   Married, 2 kids   Employed - [employer] - [employer] - in [place]   Never tobacco, drugs   No EtOH      Are you right handed or left handed? Right Handed    Are you currently employed ? No    What is your current occupation? No    Do you live at home alone? No   Who lives with you? Lives with husband    What type of home do you live in: 1 story or 2 story? Lives in a 2 story home        Social Drivers of  Health   Financial Resource Strain: Not on file  Food Insecurity: No Food Insecurity (04/14/2023)   Hunger Vital Sign    Worried About Running Out of Food in the Last Year: Never true    Ran Out of Food in the Last Year: Never true  Transportation Needs: No Transportation Needs (04/14/2023)   PRAPARE - Administrator, Civil Service (Medical): No    Lack of Transportation (Non-Medical): No  Physical Activity: Not on file  Stress: Not on file  Social Connections: Not on file     Family History: The patient's family history includes Breast cancer in her mother; Congestive Heart Failure in her father; Diabetes in her mother and sister; Heart disease in her brother and mother; Hypertension in her mother and sister; Neuropathy in her father; Other in her brother; Prostate cancer in her father.  ROS:   Please see the history of present illness.     All other systems reviewed and are negative.  EKGs/Labs/Other Studies Reviewed:    The following studies were reviewed today:   EKG:   07/31/2023: Sinus rhythm with PACs specific T wave flattening, rate 69  Recent Labs: 02/12/2023: Magnesium  2.1; TSH 3.860 09/07/2023: BUN 9; Creatinine, Ser 0.71; Hemoglobin 13.5; Platelets 152; Potassium 4.3; Sodium 144  Recent Lipid Panel    Component Value Date/Time   CHOL 156 09/07/2023 1016   TRIG 61 09/07/2023 1016   HDL 74 09/07/2023 1016   CHOLHDL 2.1 09/07/2023  1016   CHOLHDL 2 12/07/2018 0909   VLDL 10.4 12/07/2018 0909   LDLCALC 70 09/07/2023 1016    Physical Exam:    VS:  BP (!) 144/72   Pulse 71   Ht 5' 3 (1.6 m)   Wt 149 lb 8 oz (67.8 kg)   LMP 03/28/2010   SpO2 100%   BMI 26.48 kg/m     Wt Readings from Last 3 Encounters:  12/01/23 149 lb 8 oz (67.8 kg)  09/07/23 150 lb 12.8 oz (68.4 kg)  07/31/23 152 lb 3.2 oz (69 kg)     GEN:  Well nourished, well developed in no acute distress HEENT: Normal NECK: No JVD; No carotid bruits LYMPHATICS: No lymphadenopathy CARDIAC: Irregular, normal rate, no murmurs, rubs, gallops RESPIRATORY:  Clear to auscultation without rales, wheezing or rhonchi  ABDOMEN: Soft, non-tender, non-distended MUSCULOSKELETAL:  No edema; No deformity  SKIN: Warm and dry NEUROLOGIC:  Alert and oriented x 3 PSYCHIATRIC:  Normal affect   ASSESSMENT:    1. Atrial flutter, unspecified type (HCC)   2. Chest pain, unspecified type   3. SVT (supraventricular tachycardia) (HCC)   4. Essential hypertension   5. Hypercholesterolemia      PLAN:    Atrial flutter: Zio patch x 7 days 07/2023 showed less than 1% atrial flutter burden with longest episode lasting 1 minute 35 seconds with average rate 80 bpm, 10 episodes of SVT with longest episode lasting 25 seconds, frequent PACs (10% of beats), occasional PVCs (4% of beats).  CHA2DS2-VASc 2 (hypertension, female).  Echocardiogram 09/07/2023 shows EF 60 to 65%, normal RV function, mild left atrial enlargement, no significant valvular disease. -Continue Eliquis  5 mg twice daily -Continue toprol  XL 12.5 mg daily - Discussed Kardia mobile device for longer-term monitoring  Chest pain: Atypical in description.  Exercise Myoview  07/20/2023 showed fair exercise capacity (7.0 METS), normal perfusion, low risk study but noted to have frequent PVCs during exercise.  Also noted to have brief episode of irregular SVT, suspected  to be A-fib with RVR, lasted short  duration.  SVT/frequent PACs: Cardiac monitor 02/2023 showed frequent PACs (16% of beats), frequent supraventricular couplets (7%), and occasional supraventricular triplets (3.3%); also with occasional PVCs (1.8%).  Had 220 episodes of SVT, longest lasting 10 beats.  Echocardiogram 09/07/2023 shows EF 60 to 65%, normal RV function, mild left atrial enlargement, no significant valvular disease. - Started on Toprol -XL 12.5 mg daily, increased to 25 mg daily but reports did not tolerate, now on 12.5 mg daily  Hyperlipidemia: LDL 70 on 08/2023.  On simvastatin  40 mg daily  Hypertension: On Toprol -XL 12.5 mg daily.  BP elevated in clinic today but reports has been under good control at home.  Asked her to bring her home monitor to next appointment to calibrate  RTC in 6 months  Medication Adjustments/Labs and Tests Ordered: Current medicines are reviewed at length with the patient today.  Concerns regarding medicines are outlined above.  No orders of the defined types were placed in this encounter.  No orders of the defined types were placed in this encounter.   Patient Instructions  Medication Instructions:  Continue current medication *If you need a refill on your cardiac medications before your next appointment, please call your pharmacy*  Lab Work: none If you have labs (blood work) drawn today and your tests are completely normal, you will receive your results only by: MyChart Message (if you have MyChart) OR A paper copy in the mail If you have any lab test that is abnormal or we need to change your treatment, we will call you to review the results.  Testing/Procedures:  Kardia Mobile AliveCor: Website: [email or web address]  DR. Kate RECOMMENDS YOU PURCHASE   Kardia By AliveCor  INC. FROM THE  GOOGLE/ITUNE  APP PLAY STORE.  THE APP IS FREE , BUT THE  EQUIPMENT HAS A COST. IT ALLOWS YOU TO OBTAIN A RECORDING OF YOUR HEART RATE AND RHYTHM BY PROVIDING A SHORT STRIP  THAT YOU CAN SHARE WITH YOUR PROVIDER.     [facility] - sending an EKG Download app and set up profile. Run EKG - by placing 1-2 fingers on the silver plates After EKG is complete - Download PDF  - Skip password (if you apply a password the provider will need it to view the EKG) Click share button (square with upward arrow) in bottom left corner To send: choose MyChart (first time log into MyChart)  Pop up window about sending ECG Click continue Choose type of message Choose provider Type subject and message Click send (EKG should be attached)  - To send additional EKGs in one message click the paperclip image and bottom of page to attach.       Follow-Up: At [facility], you and your health needs are our priority.  As part of our continuing mission to provide you with exceptional heart care, our providers are all part of one team.  This team includes your primary Cardiologist (physician) and Advanced Practice Providers or APPs (Physician Assistants and Nurse Practitioners) who all work together to provide you with the care you need, when you need it.  Your next appointment:   6 month(s)  Provider:   Dr. Kate We recommend signing up for the patient portal called MyChart.  Sign up information is provided on this After Visit Summary.  MyChart is used to connect with patients for Virtual Visits (Telemedicine).  Patients are able to view lab/test results, encounter notes, upcoming appointments, etc.  Non-urgent messages can be  sent to your provider as well.   To learn more about what you can do with MyChart, go to [email or web address].   Other Instructions        Signed, Lonni LITTIE Nanas, MD  12/01/2023 9:16 AM    [facility]

## 2023-12-01 ENCOUNTER — Ambulatory Visit: Attending: Cardiology | Admitting: Cardiology

## 2023-12-01 ENCOUNTER — Encounter: Payer: Self-pay | Admitting: Cardiology

## 2023-12-01 VITALS — BP 144/72 | HR 71 | Ht 63.0 in | Wt 149.5 lb

## 2023-12-01 DIAGNOSIS — I4892 Unspecified atrial flutter: Secondary | ICD-10-CM | POA: Diagnosis not present

## 2023-12-01 DIAGNOSIS — R079 Chest pain, unspecified: Secondary | ICD-10-CM

## 2023-12-01 DIAGNOSIS — I471 Supraventricular tachycardia, unspecified: Secondary | ICD-10-CM

## 2023-12-01 DIAGNOSIS — I10 Essential (primary) hypertension: Secondary | ICD-10-CM | POA: Diagnosis not present

## 2023-12-01 DIAGNOSIS — E7800 Pure hypercholesterolemia, unspecified: Secondary | ICD-10-CM

## 2023-12-01 NOTE — Patient Instructions (Addendum)
 Medication Instructions:  Continue current medication *If you need a refill on your cardiac medications before your next appointment, please call your pharmacy*  Lab Work: none If you have labs (blood work) drawn today and your tests are completely normal, you will receive your results only by: MyChart Message (if you have MyChart) OR A paper copy in the mail If you have any lab test that is abnormal or we need to change your treatment, we will call you to review the results.  Testing/Procedures:  Kardia Mobile AliveCor: Website: [email or web address]  DR. Kate RECOMMENDS YOU PURCHASE   Kardia By AliveCor  INC. FROM THE  GOOGLE/ITUNE  APP PLAY STORE.  THE APP IS FREE , BUT THE  EQUIPMENT HAS A COST. IT ALLOWS YOU TO OBTAIN A RECORDING OF YOUR HEART RATE AND RHYTHM BY PROVIDING A SHORT STRIP THAT YOU CAN SHARE WITH YOUR PROVIDER.     [facility] - sending an EKG Download app and set up profile. Run EKG - by placing 1-2 fingers on the silver plates After EKG is complete - Download PDF  - Skip password (if you apply a password the provider will need it to view the EKG) Click share button (square with upward arrow) in bottom left corner To send: choose MyChart (first time log into MyChart)  Pop up window about sending ECG Click continue Choose type of message Choose provider Type subject and message Click send (EKG should be attached)  - To send additional EKGs in one message click the paperclip image and bottom of page to attach.       Follow-Up: At [facility], you and your health needs are our priority.  As part of our continuing mission to provide you with exceptional heart care, our providers are all part of one team.  This team includes your primary Cardiologist (physician) and Advanced Practice Providers or APPs (Physician Assistants and Nurse Practitioners) who all work together to provide you with the care you need, when you need it.  Your next  appointment:   6 month(s)  Provider:   Dr. Kate We recommend signing up for the patient portal called MyChart.  Sign up information is provided on this After Visit Summary.  MyChart is used to connect with patients for Virtual Visits (Telemedicine).  Patients are able to view lab/test results, encounter notes, upcoming appointments, etc.  Non-urgent messages can be sent to your provider as well.   To learn more about what you can do with MyChart, go to [email or web address].   Other Instructions

## 2023-12-10 ENCOUNTER — Encounter: Payer: Self-pay | Admitting: Cardiology

## 2024-01-15 ENCOUNTER — Other Ambulatory Visit: Payer: Self-pay | Admitting: Cardiology

## 2024-01-15 DIAGNOSIS — I471 Supraventricular tachycardia, unspecified: Secondary | ICD-10-CM

## 2024-01-26 ENCOUNTER — Encounter: Payer: Self-pay | Admitting: Cardiology

## 2024-01-27 ENCOUNTER — Ambulatory Visit: Admitting: Radiology

## 2024-01-27 ENCOUNTER — Other Ambulatory Visit (HOSPITAL_COMMUNITY)
Admission: RE | Admit: 2024-01-27 | Discharge: 2024-01-27 | Disposition: A | Source: Ambulatory Visit | Attending: Radiology | Admitting: Radiology

## 2024-01-27 ENCOUNTER — Encounter: Payer: Self-pay | Admitting: Radiology

## 2024-01-27 VITALS — BP 132/74 | HR 94 | Ht 63.5 in | Wt 143.0 lb

## 2024-01-27 DIAGNOSIS — Z1331 Encounter for screening for depression: Secondary | ICD-10-CM | POA: Diagnosis not present

## 2024-01-27 DIAGNOSIS — Z13820 Encounter for screening for osteoporosis: Secondary | ICD-10-CM

## 2024-01-27 DIAGNOSIS — Z01419 Encounter for gynecological examination (general) (routine) without abnormal findings: Secondary | ICD-10-CM | POA: Insufficient documentation

## 2024-01-27 NOTE — Progress Notes (Signed)
 LANORA REVERON 10-06-1960 [id number]   History: Postmenopausal 63 y.o. presents for annual exam. Increased stress caring for husband. Otherwise doing well.   Gynecologic History Postmenopausal Last Pap: 8/22. Results were: normal Last mammogram: 11/27/23. Results were: normal Last colonoscopy: 2020   Obstetric History OB History  Gravida Para Term Preterm AB Living  2 2 2   2   SAB IAB Ectopic Multiple Live Births      2    # Outcome Date GA Lbr Len/2nd Weight Sex Type Anes PTL Lv  2 Term     M CS-Classical   LIV  1 Term     M CS-Classical   LIV       01/27/2024   10:23 AM 01/15/2023   10:05 AM 11/10/2022   10:43 AM  Depression screen PHQ 2/9  Decreased Interest 3 2 3   Down, Depressed, Hopeless 2 2 2   PHQ - 2 Score 5 4 5   Altered sleeping 2 2 2   Tired, decreased energy 2 2 2   Change in appetite 0 0 0  Feeling bad or failure about yourself  0 1 1  Trouble concentrating 2 2 3   Moving slowly or fidgety/restless 0 0 0  Suicidal thoughts 0 0 1  PHQ-9 Score 11 11 14   Difficult doing work/chores Not difficult at all Somewhat difficult      The following portions of the patient's history were reviewed and updated as appropriate: allergies, current medications, past family history, past medical history, past social history, past surgical history, and problem list.  Review of Systems Pertinent items noted in HPI and remainder of comprehensive ROS otherwise negative.  Past medical history, past surgical history, family history and social history were all reviewed and documented in the EPIC chart.  Exam:  Vitals:   01/27/24 1021  BP: 132/74  Pulse: 94  SpO2: 96%  Weight: 143 lb (64.9 kg)  Height: 5' 3.5 (1.613 m)   Body mass index is 24.93 kg/m.  General appearance:  Normal Thyroid :  Symmetrical, normal in size, without palpable masses or nodularity. Respiratory  Auscultation:  Clear without wheezing or rhonchi Cardiovascular  Auscultation:  Regular rate, without  rubs, murmurs or gallops  Edema/varicosities:  Not grossly evident Abdominal  Soft,nontender, without masses, guarding or rebound.  Liver/spleen:  No organomegaly noted  Hernia:  None appreciated  Skin  Inspection:  Grossly normal Breasts: Examined lying and sitting.   Right: Without masses, retractions, nipple discharge or axillary adenopathy.   Left: Without masses, retractions, nipple discharge or axillary adenopathy. Genitourinary   Inguinal/mons:  Normal without inguinal adenopathy  External genitalia:  Normal appearing vulva with no masses, tenderness, or lesions  BUS/Urethra/Skene's glands:  Normal  Vagina:  Normal appearing with normal color and discharge, no lesions. Atrophy: moderate   Cervix:  Normal appearing without discharge or lesions  Uterus:  Normal in size, shape and contour.  Midline and mobile, nontender  Adnexa/parametria:     Rt: Normal in size, without masses or tenderness.   Lt: Normal in size, without masses or tenderness.  Anus and perineum: Normal    Darice Hoit, CMA present for exam  Assessment/Plan:   1. Well woman exam with routine gynecological exam (Primary) - Cytology - PAP( [facility])  2. Screening for osteoporosis - DG Bone Density; Future  3. Depression screen Positive, declines therapy or meds Reports related to caring for her husband    Return in 1 year for annual or sooner prn.  Candas Deemer B  WHNP-BC, 10:38 AM 01/27/2024

## 2024-01-27 NOTE — Telephone Encounter (Signed)
 Okay to hold metoprolol .  Recommend referral to EP for her atrial flutter/SVT/frequent PACs and not able to tolerate beta-blockers

## 2024-01-27 NOTE — Patient Instructions (Signed)
 Preventive Care 58-64 Years Old, Female  Preventive care refers to lifestyle choices and visits with your health care provider that can promote health and wellness. Preventive care visits are also called wellness exams.  What can I expect for my preventive care visit?  Counseling  Your health care provider may ask you questions about your:  Medical history, including:  Past medical problems.  Family medical history.  Pregnancy history.  Current health, including:  Menstrual cycle.  Method of birth control.  Emotional well-being.  Home life and relationship well-being.  Sexual activity and sexual health.  Lifestyle, including:  Alcohol, nicotine or tobacco, and drug use.  Access to firearms.  Diet, exercise, and sleep habits.  Work and work Astronomer.  Sunscreen use.  Safety issues such as seatbelt and bike helmet use.  Physical exam  Your health care provider will check your:  Height and weight. These may be used to calculate your BMI (body mass index). BMI is a measurement that tells if you are at a healthy weight.  Waist circumference. This measures the distance around your waistline. This measurement also tells if you are at a healthy weight and may help predict your risk of certain diseases, such as type 2 diabetes and high blood pressure.  Heart rate and blood pressure.  Body temperature.  Skin for abnormal spots.  What immunizations do I need?    Vaccines are usually given at various ages, according to a schedule. Your health care provider will recommend vaccines for you based on your age, medical history, and lifestyle or other factors, such as travel or where you work.  What tests do I need?  Screening  Your health care provider may recommend screening tests for certain conditions. This may include:  Lipid and cholesterol levels.  Diabetes screening. This is done by checking your blood sugar (glucose) after you have not eaten for a while (fasting).  Pelvic exam and Pap test.  Hepatitis B test.  Hepatitis C  test.  HIV (human immunodeficiency virus) test.  STI (sexually transmitted infection) testing, if you are at risk.  Lung cancer screening.  Colorectal cancer screening.  Mammogram. Talk with your health care provider about when you should start having regular mammograms. This may depend on whether you have a family history of breast cancer.  BRCA-related cancer screening. This may be done if you have a family history of breast, ovarian, tubal, or peritoneal cancers.  Bone density scan. This is done to screen for osteoporosis.  Talk with your health care provider about your test results, treatment options, and if necessary, the need for more tests.  Follow these instructions at home:  Eating and drinking    Eat a diet that includes fresh fruits and vegetables, whole grains, lean protein, and low-fat dairy products.  Take vitamin and mineral supplements as recommended by your health care provider.  Do not drink alcohol if:  Your health care provider tells you not to drink.  You are pregnant, may be pregnant, or are planning to become pregnant.  If you drink alcohol:  Limit how much you have to 0-1 drink a day.  Know how much alcohol is in your drink. In the [place] one drink equals one 12 oz bottle of beer (355 mL), one 5 oz glass of wine (148 mL), or one 1 oz glass of hard liquor (44 mL).  Lifestyle  Brush your teeth every morning and night with fluoride toothpaste. Floss one time each day.  Exercise for at least  30 minutes 5 or more days each week.  Do not use any products that contain nicotine or tobacco. These products include cigarettes, chewing tobacco, and vaping devices, such as e-cigarettes. If you need help quitting, ask your health care provider.  Do not use drugs.  If you are sexually active, practice safe sex. Use a condom or other form of protection to prevent STIs.  If you do not wish to become pregnant, use a form of birth control. If you plan to become pregnant, see your health care provider for a  prepregnancy visit.  Take aspirin only as told by your health care provider. Make sure that you understand how much to take and what form to take. Work with your health care provider to find out whether it is safe and beneficial for you to take aspirin daily.  Find healthy ways to manage stress, such as:  Meditation, yoga, or listening to music.  Journaling.  Talking to a trusted person.  Spending time with friends and family.  Minimize exposure to UV radiation to reduce your risk of skin cancer.  Safety  Always wear your seat belt while driving or riding in a vehicle.  Do not drive:  If you have been drinking alcohol. Do not ride with someone who has been drinking.  When you are tired or distracted.  While texting.  If you have been using any mind-altering substances or drugs.  Wear a helmet and other protective equipment during sports activities.  If you have firearms in your house, make sure you follow all gun safety procedures.  Seek help if you have been physically or sexually abused.  What's next?  Visit your health care provider once a year for an annual wellness visit.  Ask your health care provider how often you should have your eyes and teeth checked.  Stay up to date on all vaccines.  This information is not intended to replace advice given to you by your health care provider. Make sure you discuss any questions you have with your health care provider.  Document Revised: 10/10/2020 Document Reviewed: 10/10/2020  Elsevier Patient Education  2024 [employer].

## 2024-01-29 ENCOUNTER — Ambulatory Visit: Payer: Self-pay | Admitting: Radiology

## 2024-01-29 LAB — CYTOLOGY - PAP
Comment: NEGATIVE
Diagnosis: NEGATIVE
High risk HPV: NEGATIVE

## 2024-02-01 ENCOUNTER — Encounter: Payer: Self-pay | Admitting: Cardiology

## 2024-02-01 NOTE — Telephone Encounter (Signed)
 See recent telephone encounter- does not look like this has been done yet: Okay to hold metoprolol . Recommend referral to EP for her atrial flutter/SVT/frequent PACs and not able to tolerate beta-blockers

## 2024-02-03 ENCOUNTER — Ambulatory Visit (INDEPENDENT_AMBULATORY_CARE_PROVIDER_SITE_OTHER)

## 2024-02-03 ENCOUNTER — Telehealth: Payer: Self-pay | Admitting: Cardiology

## 2024-02-03 DIAGNOSIS — I4892 Unspecified atrial flutter: Secondary | ICD-10-CM

## 2024-02-03 DIAGNOSIS — Z23 Encounter for immunization: Secondary | ICD-10-CM | POA: Diagnosis not present

## 2024-02-03 DIAGNOSIS — I493 Ventricular premature depolarization: Secondary | ICD-10-CM

## 2024-02-03 DIAGNOSIS — I471 Supraventricular tachycardia, unspecified: Secondary | ICD-10-CM

## 2024-02-03 NOTE — Telephone Encounter (Signed)
  Patient is calling to follow up mychart message she sent last Friday

## 2024-02-03 NOTE — Progress Notes (Signed)
 Pt was given egg free flu vaccine with no complications.

## 2024-02-03 NOTE — Telephone Encounter (Signed)
 Called pt advised of MD response:  See recent telephone encounter- does not look like this has been done yet: Okay to hold metoprolol . Recommend referral to EP for her atrial flutter/SVT/frequent PACs and not able to tolerate beta-blockers    Pt reports last BP 99/57-44 hasn't taken metoprolol  in 3-4 days.  Wants to know if she can take Xanax  with low BP and HR.  Reports is under a lot of stress and feels that she needs med but is scared to take it.  Discussed medication concern with Pharmacist Advised pt with BP and HR being so low don't recommend taking medication can try cognitive behavioral therapy.  Pt expresses understanding.

## 2024-02-04 ENCOUNTER — Ambulatory Visit: Attending: Cardiology | Admitting: Cardiology

## 2024-02-04 ENCOUNTER — Encounter: Payer: Self-pay | Admitting: Cardiology

## 2024-02-04 VITALS — BP 160/68 | HR 134 | Ht 63.5 in | Wt 143.0 lb

## 2024-02-04 DIAGNOSIS — I471 Supraventricular tachycardia, unspecified: Secondary | ICD-10-CM

## 2024-02-04 DIAGNOSIS — I491 Atrial premature depolarization: Secondary | ICD-10-CM

## 2024-02-04 DIAGNOSIS — I4892 Unspecified atrial flutter: Secondary | ICD-10-CM

## 2024-02-04 DIAGNOSIS — I493 Ventricular premature depolarization: Secondary | ICD-10-CM | POA: Diagnosis not present

## 2024-02-04 DIAGNOSIS — R002 Palpitations: Secondary | ICD-10-CM

## 2024-02-04 MED ORDER — PROPAFENONE HCL ER 225 MG PO CP12: 225.0000 mg | ORAL_CAPSULE | Freq: Two times a day (BID) | ORAL | 6 refills | Status: DC

## 2024-02-04 NOTE — Progress Notes (Signed)
  Electrophysiology Office Note:   Date:  02/04/2024  ID:  LAURE LEONE, DOB February 21, 1961, MRN [id number]  Primary Cardiologist: Lonni LITTIE Nanas, MD Primary Heart Failure: None Electrophysiologist: Clenton Esper Gladis Norton, MD      History of Present Illness:   CARSON MECHE is a 63 y.o. female with h/o PACs, hypertension, hyperlipidemia seen today for  for Electrophysiology evaluation of atrial flutter at the request of Lonni Nanas.    She had been doing well until she noted palpitations.  Cardiac monitor November 2024 showed 16% PACs and 1.8% PVCs.  She had a Myoview  May 2025 without ischemia.  She wore another cardiac monitor April 2025 with a less than 1% atrial flutter burden, 10% PACs, 4% PVCs.  Echo showed a normal ejection fraction.  She is under quite a bit of stress.  She has no chest pain, but does note increased palpitations, fatigue, shortness of breath.  Is worse today.  She is taking care of her sick husband at home which is the cause of some of her stress.  There are times that she feels well, but other times that she notes these significant palpitations and fatigue.  Review of systems complete and found to be negative unless listed in HPI.   EP Information / Studies Reviewed:    EKG is ordered today. Personal review as below.        Risk Assessment/Calculations:    CHA2DS2-VASc Score = 2   This indicates a 2.2% annual risk of stroke. The patient's score is based upon: CHF History: 0 HTN History: 1 Diabetes History: 0 Stroke History: 0 Vascular Disease History: 0 Age Score: 0 Gender Score: 1             Physical Exam:   VS:  BP (!) 160/68 (BP Location: Right Arm, Patient Position: Sitting, Cuff Size: Normal)   Pulse (!) 134   Ht 5' 3.5 (1.613 m)   Wt 143 lb (64.9 kg)   LMP 03/28/2010   SpO2 100%   BMI 24.93 kg/m    Wt Readings from Last 3 Encounters:  02/04/24 143 lb (64.9 kg)  01/27/24 143 lb (64.9 kg)  12/01/23 149 lb 8 oz (67.8 kg)      GEN: Well nourished, well developed in no acute distress NECK: No JVD; No carotid bruits CARDIAC: Irregularly irregular rate and rhythm, no murmurs, rubs, gallops RESPIRATORY:  Clear to auscultation without rales, wheezing or rhonchi  ABDOMEN: Soft, non-tender, non-distended EXTREMITIES:  No edema; No deformity   ASSESSMENT AND PLAN:    1.  Typical atrial flutter: Cardiac monitor with a 1% burden.  Longest episode 1 minute 35 seconds.  Minimal episodes.  Due to her short episodes, and low stroke risk, she may be able to stop anticoagulation at her next visit.  2.  PACs/PVCs/SVT: 16% PAC burden, 4% PVC burden.  She is in sinus rhythm today with a very high burden of PACs and PVCs.  She feels that her heart is quite erratic.  She is also under quite a bit of stress.  Cheyenna Pankowski plan for propafenone 225 mg twice daily.  Annaleese Guier have her come back in 2 weeks for an EKG.  Propafenone can be increased if necessary.  3.  Hypertension: Elevated today.  Has been well-controlled in the past.  No changes.  Follow up with EP Team in 3 months  Signed, Madesyn Ast Gladis Norton, MD

## 2024-02-04 NOTE — Patient Instructions (Addendum)
 Medication Instructions:  Your physician has recommended you make the following change in your medication:  START Propafenone 225 mg twice a day  *If you need a refill on your cardiac medications before your next appointment, please call your pharmacy*  Lab Work: None ordered  If you have any lab test that is abnormal or we need to change your treatment, we will call you to review the results.  Testing/Procedures: None ordered  Follow-Up: At [facility], you and your health needs are our priority.  As part of our continuing mission to provide you with exceptional heart care, our providers are all part of one team.  This team includes your primary Cardiologist (physician) and Advanced Practice Providers or APPs (Physician Assistants and Nurse Practitioners) who all work together to provide you with the care you need, when you need it.  Your physician recommends that you schedule a follow-up appointment in: 2 weeks for nurse visit EKG after starting Propafenone.  Your next appointment:   3 month(s)  Provider:   You may see one of the following Advanced Practice Providers on your designated Care Team:   Charlies Arthur, PA-C Michael Andy Tillery, PA-C Suzann Riddle, NP Daphne Barrack, NP Artist Pouch, PA-C   Thank you for choosing [facility]!!   Maeola Domino, RN [phone]   Other Instructions  Propafenone Tablets What is this medication? PROPAFENONE (proe pa FEEN one) prevents and treats a fast or irregular heartbeat (arrhythmia). It is often used to treat a type of arrhythmia known as AFib (atrial fibrillation). It works by slowing down overactive electric signals in the heart, which stabilizes your heart rhythm. It belongs to a group of medications called antiarrhythmics. This medicine may be used for other purposes; ask your health care provider or pharmacist if you have questions. COMMON BRAND NAME(S): Rythmol What should I tell my care team before I take  this medication? They need to know if you have any of these conditions: Brugada syndrome Have had a heart attack Heart failure High or low levels of electrolytes, such as magnesium , potassium, or sodium in your blood Kidney disease Liver disease Low blood pressure Lung or breathing disease, such as asthma or COPD Pacemaker Slow heartbeat An unusual or allergic reaction to propafenone, other medications, foods, dyes, or preservatives Pregnant or trying to get pregnant Breastfeeding How should I use this medication? Take this medication by mouth with water . Take it as directed on the prescription label at the same time every day. You can take it with or without food. If it upsets your stomach, take it with food. Keep taking it unless your care team tells you to stop. Talk to your care team about the use of this medication in children. Special care may be needed. Overdosage: If you think you have taken too much of this medicine contact a poison control center or emergency room at once. NOTE: This medicine is only for you. Do not share this medicine with others. What if I miss a dose? If you miss a dose, take it as soon as you can. If it is almost time for your next dose, take only that dose. Do not take double or extra doses. What may interact with this medication? Do not take this medication with any of the following: Certain medications for fungal infections, such as fluconazole, ketoconazole, posaconazole Certain medications for irregular heartbeat, such as dronedarone Cisapride Idelalisib Nirmatrelvir; ritonavir Pimozide Saquinavir Thioridazine Tipranavir This medication may also interact with the following: Beta blockers,  such as propranolol  Digoxin Grapefruit juice Orlistat Warfarin Other medications may affect the way this medication works. Talk with your care team about all the medications you take. They may suggest changes to your treatment plan to lower the risk of side  effects and to make sure your medications work as intended. This list may not describe all possible interactions. Give your health care provider a list of all the medicines, herbs, non-prescription drugs, or dietary supplements you use. Also tell them if you smoke, drink alcohol, or use illegal drugs. Some items may interact with your medicine. What should I watch for while using this medication? Your condition will be monitored closely when you first begin therapy. Often, this medication is first started in a hospital or other monitored health care setting. Once you are on maintenance therapy, visit your care team for regular checks on your progress. Because your condition and use of this medication carry some risk, it is a good idea to carry an identification card, necklace or bracelet with details of your condition, medications, and care team. This medication may affect your coordination, reaction time, or judgment. Do not drive or operate machinery until you know how this medication affects you. Sit up or stand slowly to reduce the risk of dizzy or fainting spells. Drinking alcohol with this medication can increase the risk of these side effects. If you are going to have surgery, tell your care team that you are taking this medication. What side effects may I notice from receiving this medication? Side effects that you should report to your care team as soon as possible: Allergic reactions--skin rash, itching, hives, swelling of the face, lips, tongue, or throat Heart failure--shortness of breath, swelling of the ankles, feet, or hands, sudden weight gain, unusual weakness or fatigue Heart rhythm changes--fast or irregular heartbeat, dizziness, feeling faint or lightheaded, chest pain, trouble breathing Infection--fever, chills, cough, sore throat Unusual bruising or bleeding Side effects that usually do not require medical attention (report to your care team if they continue or are  bothersome): Change in taste Constipation Dizziness Fatigue Nausea This list may not describe all possible side effects. Call your doctor for medical advice about side effects. You may report side effects to FDA at 1-800-FDA-1088. Where should I keep my medication? Keep out of the reach of children and pets. Store at room temperature between 15 and 30 degrees C (59 and 86 degrees F). Protect from light. Keep container tightly closed. Throw away any unused medication after the expiration date. NOTE: This sheet is a summary. It may not cover all possible information. If you have questions about this medicine, talk to your doctor, pharmacist, or health care provider.  2024 Elsevier/Gold Standard (2022-10-16 00:00:00)

## 2024-02-05 ENCOUNTER — Other Ambulatory Visit: Payer: Self-pay | Admitting: *Deleted

## 2024-02-05 ENCOUNTER — Telehealth: Payer: Self-pay | Admitting: Cardiology

## 2024-02-05 DIAGNOSIS — I493 Ventricular premature depolarization: Secondary | ICD-10-CM

## 2024-02-05 DIAGNOSIS — I471 Supraventricular tachycardia, unspecified: Secondary | ICD-10-CM

## 2024-02-05 NOTE — Telephone Encounter (Signed)
 Pt wanting to know if she can have caffeine while taking this medication.  Advised that she may, but should limit caffeine intake.  Aware that if she has continued elevated heart rates after starting the medication she will need to consider cutting back further on caffeine.  Patient verbalized understanding and agreeable to plan.

## 2024-02-05 NOTE — Telephone Encounter (Signed)
 Pt c/o medication issue:  1. Name of Medication: propafenone (RYTHMOL SR) 225 MG 12 hr capsule   2. How are you currently taking this medication (dosage and times per day)?   3. Are you having a reaction (difficulty breathing--STAT)? No  4. What is your medication issue? Pt states that she has questions regarding medication and would like a c/b. Please advise

## 2024-02-05 NOTE — Progress Notes (Signed)
 Order placed for referral to Electrophysiology per Dr. Kate

## 2024-02-17 ENCOUNTER — Telehealth: Payer: Self-pay | Admitting: Cardiology

## 2024-02-17 NOTE — Telephone Encounter (Signed)
 Pt c/o medication issue:  1. Name of Medication: propafenone (RYTHMOL SR) 225 MG 12 hr capsule   2. How are you currently taking this medication (dosage and times per day)? Take 1 capsule (225 mg total) by mouth 2 (two) times daily.   3. Are you having a reaction (difficulty breathing--STAT)? No   4. What is your medication issue? Pt is having very dark urine since taking medication. She is concerned with this side effect and would like a call back. Please advise.

## 2024-02-18 ENCOUNTER — Ambulatory Visit: Attending: Internal Medicine

## 2024-02-18 VITALS — BP 172/80 | HR 77 | Resp 14 | Ht 63.0 in | Wt 142.6 lb

## 2024-02-18 DIAGNOSIS — I4892 Unspecified atrial flutter: Secondary | ICD-10-CM

## 2024-02-18 DIAGNOSIS — I471 Supraventricular tachycardia, unspecified: Secondary | ICD-10-CM

## 2024-02-18 DIAGNOSIS — I493 Ventricular premature depolarization: Secondary | ICD-10-CM

## 2024-02-18 NOTE — Telephone Encounter (Signed)
 Nurse visit today. DOD consulted.

## 2024-02-18 NOTE — Telephone Encounter (Signed)
 Looks like she was seen today for a RN visit. I would confirm that by dark she means dark yellow as opposed to dark red/black She should see her PCP if no improvement or she has sx of UTI- burning, pain

## 2024-02-18 NOTE — Progress Notes (Signed)
   Nurse Visit   Date of Encounter: 02/18/2024 ID: Grace George, DOB August 20, 1960, MRN [id number]  PCP:  Rollene Almarie LABOR, MD   [facility] Providers Cardiologist:  Lonni LITTIE Nanas, MD Electrophysiologist:  Soyla Gladis Norton, MD      Visit Details   VS:  BP (!) 172/80   Pulse 77   Resp 14   Ht 5' 3 (1.6 m)   Wt 142 lb 9.6 oz (64.7 kg)   LMP 03/28/2010   SpO2 97%   BMI 25.26 kg/m  , BMI Body mass index is 25.26 kg/m.  Wt Readings from Last 3 Encounters:  02/18/24 142 lb 9.6 oz (64.7 kg)  02/04/24 143 lb (64.9 kg)  01/27/24 143 lb (64.9 kg)     Reason for visit: F/u concerns for super dark urine after starting Rythmol SR two weeks ago.  Performed today: Vital signs, EKG, and history reviewed. Patient denied shortness of breath, chest pain, headache. Reports some dizziness, but has not had any falls or caused patient to be able to perform daily living activities. Reports she is ingesting approx 60oz liquids daily, mostly water . Consulted with Dr. Nishan (Doctor of Day) who advised that there are no issues seen, patient to continue Rhythmol and consult with PCP if darkened urine continues. Dr. Nishan also recommended patient monitor BP at home and advise us  if it continued to stay elevated. Patient verbalized understanding and performed teach back of same instructions. Advised patient that if BP readings contiunue to stay 140/40 or above, to please let us  know -  patient reports she does have a way to check her BP at home already. Changes (medications, testing, etc.) : Continue Rythmol as directed, Check BP at home and keep log  Length of Visit: 15 minutes    Medications Adjustments/Labs and Tests Ordered: Orders Placed This Encounter  Procedures   EKG 12-Lead   No orders of the defined types were placed in this encounter.    Signed, Sherran KATHEE Louder, RN  02/18/2024 11:19 AM

## 2024-04-12 ENCOUNTER — Other Ambulatory Visit: Payer: Self-pay | Admitting: Cardiology

## 2024-04-12 DIAGNOSIS — E7800 Pure hypercholesterolemia, unspecified: Secondary | ICD-10-CM

## 2024-04-13 ENCOUNTER — Encounter: Payer: Self-pay | Admitting: Internal Medicine

## 2024-04-14 NOTE — Telephone Encounter (Signed)
 Spoke with pt and has decided that she will wait until after Christmas. Pt stated that she is not feeling that bad and has family/grandchildren coming to visit. Advised pt to call office if she would like to schedule an appt.

## 2024-04-20 ENCOUNTER — Telehealth: Payer: Self-pay | Admitting: Cardiology

## 2024-04-20 NOTE — Telephone Encounter (Signed)
 Called back to speak with pt.  She has not check the [employer] at this time.  Advised again to continue to monitor her vital signs.  If heart rate doesn't decrease to less than 100 bpm and she is having symptoms she needs to report to the closest ED for further evaluation and treatment.  Pt states understanding.  She has been added on to be seen Friday by Daphne Barrack at 10:55 am.  Reviewed date, time and location with pt.

## 2024-04-20 NOTE — Telephone Encounter (Signed)
 Pt c/o BP issue: STAT if pt c/o blurred vision, one-sided weakness or slurred speech.  STAT if BP is GREATER than 180/120 TODAY.  STAT if BP is LESS than 90/60 and SYMPTOMATIC TODAY  1. What is your BP concern? Elevated blood pressure   2. Have you taken any BP medication today?No  3. What are your last 5 BP readings? 157/89 HR 132  4. Are you having any other symptoms (ex. Dizziness, headache, blurred vision, passed out)? Dizziness

## 2024-04-20 NOTE — Progress Notes (Unsigned)
 " Electrophysiology Office Note:   Date:  04/22/2024  ID:  Grace George, DOB 07-Sep-1960, MRN [id number]  Primary Cardiologist: Lonni LITTIE Nanas, MD Primary Heart Failure: None Electrophysiologist: Will Gladis Norton, MD      History of Present Illness:   Grace George is a 63 y.o. female with h/o PAC's, AFL, HTN, HLD, anxiety seen today for acute visit due to elevated HR's.    She was evaluated by Dr. Norton in 01/2024 for AFL seen on cardiac monitor.   Pt called 12/24 reporting elevated HR's and blood pressure. She noted she had missed one dose of Eliquis  in the last 4-5 days.  She had not used her [facility] to check the rhythm. ER precautions were given.    Patient reports she had noted an increase in her blood pressure at home, increase in palpitations over the last few days.  She got in the car to drive to the appt today and felt very anxious and took a xanax .  She reports she has a situational stress at home that is very upsetting at times. She removed herself from the situation yesterday and felt better.  She confirms she is safe at home but does not give further details on the situation. She notes she was previously on metoprolol  and when she was started on propafenone , it was stopped. Her blood pressure has been elevated at times when she would not expect it to be - getting her hair done, sitting on the couch. She reports she has had elevated HR's at time when she was resting and not stressed.  She denies chest pain, dyspnea, PND, orthopnea, nausea, vomiting, dizziness, syncope, edema, weight gain, or early satiety.   Review of systems complete and found to be negative unless listed in HPI.   EP Information / Studies Reviewed:    EKG is ordered today. Personal review as below.  EKG Interpretation Date/Time:  Friday April 22 2024 10:56:37 EST Ventricular Rate:  81 PR Interval:  226 QRS Duration:  78 QT Interval:  370 QTC Calculation: 429 R Axis:   57  Text  Interpretation: Sinus rhythm with 1st degree A-V block Confirmed by Aniceto Jarvis ([id number]) on 04/22/2024 11:00:23 AM    Arrhythmia / AAD / Pertinent EP Studies AFL Cardiac Monitor 07/2023 > 1% AFL with longest episode lasting 35 seconds with ave HR 80 bpm, 10 episodes of SVT with longest lasting 25 seconds with ave rate of 106 bpm, frequent PAC's 10%, occ PVC's 4%  Propafenone  01/2024 >    Risk Assessment/Calculations:    CHA2DS2-VASc Score = 2   This indicates a 2.2% annual risk of stroke. The patient's score is based upon: CHF History: 0 HTN History: 1 Diabetes History: 0 Stroke History: 0 Vascular Disease History: 0 Age Score: 0 Gender Score: 1              Physical Exam:   VS:  BP 138/76   Pulse 81   Ht 5' 3 (1.6 m)   Wt 141 lb 6.4 oz (64.1 kg)   LMP 03/28/2010   SpO2 98%   BMI 25.05 kg/m    Wt Readings from Last 3 Encounters:  04/22/24 141 lb 6.4 oz (64.1 kg)  02/18/24 142 lb 9.6 oz (64.7 kg)  02/04/24 143 lb (64.9 kg)     GEN: Well nourished, well developed in no acute distress NECK: No JVD; No carotid bruits CARDIAC: Regular rate and rhythm, no murmurs, rubs, gallops RESPIRATORY:  Clear to  auscultation without rales, wheezing or rhonchi  ABDOMEN: Soft, non-tender, non-distended EXTREMITIES:  No edema; No deformity   ASSESSMENT AND PLAN:    Typical Atrial Flutter  PAC's / PVC's / SVT  1st Degree AVB  High Risk Medication Monitoring: Propafenone   1% burden on recent monitor, brief episodes, CHA2DS2-VASc 2 -EKG with NSR, 1st degree AVB. EKG's prior were near 200 ms PR -continue propafenone , increase to 300 mg BID  -Kardia mobile review of episode looked like an AT but had artifact -difficult to discern if her symptoms are related to situational stress, if continued symptoms on increased propafenone , consider repeat monitor  -continue OAC for now given increase in symptoms   Secondary Hypercoagulable State  -continue Eliquis  5mg  BID, dose reviewed  and appropriate by age / wt   Hypertension  -initially elevated in clinic > ok on repeat  -asked patient to record BP at home over the next few weeks and bring back to her (already scheduled) follow up appt for review  Situational Stress  -encouraged patient to visit with her PCP to discuss stress management strategies as it appears to be impacting her BP / HR at times  -she is taking an old Xanax  Rx currently to help   Follow up with EP APP 05/07/23 as previously planned   Recall placed for Dr. Kate as well for one year.    Signed, Daphne Barrack, NP-C, AGACNP-BC [facility] - Electrophysiology  04/22/2024, 12:22 PM  "

## 2024-04-20 NOTE — Telephone Encounter (Signed)
 Spoke with patient who is reporting for 3 or 4 days she has had increased HR and elevated BP.  HR has been between 102 and 132.  Currently she reports it jumping from 53 to 97 bpm.  BP has been running in the 150/80 to 90 range.  She reporting taking medications as ordered though she did miss one dose of Elquis 4 or 5 days ago.  Today she took Alprazolam  because she is feeling anxious and that seems to be helping her per her report.  She denies any other s/s such as chest pain/SOB/dizziness, etc currently.  Advised to avoid any types of stimulants.  Pt has KardiaMobile but has not checked it.  She will do this in a few minutes.  Advised I will forward this information for review and further recommendations.  Advised if elevated HR continues and she is having symptoms she will need to be seen in closest ED for further evaluation and treatment.

## 2024-04-21 ENCOUNTER — Other Ambulatory Visit: Payer: Self-pay | Admitting: Internal Medicine

## 2024-04-21 DIAGNOSIS — J309 Allergic rhinitis, unspecified: Secondary | ICD-10-CM

## 2024-04-22 ENCOUNTER — Encounter: Payer: Self-pay | Admitting: Pulmonary Disease

## 2024-04-22 ENCOUNTER — Ambulatory Visit: Attending: Pulmonary Disease | Admitting: Pulmonary Disease

## 2024-04-22 VITALS — BP 138/76 | HR 81 | Ht 63.0 in | Wt 141.4 lb

## 2024-04-22 DIAGNOSIS — I10 Essential (primary) hypertension: Secondary | ICD-10-CM

## 2024-04-22 DIAGNOSIS — R002 Palpitations: Secondary | ICD-10-CM

## 2024-04-22 DIAGNOSIS — I4892 Unspecified atrial flutter: Secondary | ICD-10-CM

## 2024-04-22 DIAGNOSIS — I493 Ventricular premature depolarization: Secondary | ICD-10-CM | POA: Diagnosis not present

## 2024-04-22 DIAGNOSIS — I491 Atrial premature depolarization: Secondary | ICD-10-CM

## 2024-04-22 DIAGNOSIS — I471 Supraventricular tachycardia, unspecified: Secondary | ICD-10-CM

## 2024-04-22 MED ORDER — PROPAFENONE HCL ER 325 MG PO CP12: 325.0000 mg | ORAL_CAPSULE | Freq: Two times a day (BID) | ORAL | 6 refills | Status: DC

## 2024-04-22 NOTE — Patient Instructions (Signed)
 Medication Instructions:  We will increase your propafenone  to 325 mg twice daily (AM / PM)   *If you need a refill on your cardiac medications before your next appointment, please call your pharmacy*  Lab Work: No lab work today If you have labs (blood work) drawn today and your tests are completely normal, you will receive your results only by: MyChart Message (if you have MyChart) OR A paper copy in the mail If you have any lab test that is abnormal or we need to change your treatment, we will call you to review the results.  Testing/Procedures: No testing/procedures were scheduled today  Follow-Up: At [facility], you and your health needs are our priority.  As part of our continuing mission to provide you with exceptional heart care, our providers are all part of one team.  This team includes your primary Cardiologist (physician) and Advanced Practice Providers or APPs (Physician Assistants and Nurse Practitioners) who all work together to provide you with the care you need, when you need it.  Your next appointment:   With [name] on 1/9/5.  Please keep a record of your blood pressure at home and record.  Bring with you to the visit.   Provider:   You may see Will Gladis Norton, MD or one of the following Advanced Practice Providers on your designated Care Team:    Daphne Barrack, NP    We recommend signing up for the patient portal called MyChart.  Sign up information is provided on this After Visit Summary.  MyChart is used to connect with patients for Virtual Visits (Telemedicine).  Patients are able to view lab/test results, encounter notes, upcoming appointments, etc.  Non-urgent messages can be sent to your provider as well.   To learn more about what you can do with MyChart, go to [email or web address].     We will put in a recall for you for Dr. Kate to be seen in August 2026

## 2024-04-29 ENCOUNTER — Telehealth: Payer: Self-pay | Admitting: Cardiology

## 2024-04-29 ENCOUNTER — Telehealth: Payer: Self-pay | Admitting: *Deleted

## 2024-04-29 MED ORDER — PROPAFENONE HCL ER 225 MG PO CP12: 225.0000 mg | ORAL_CAPSULE | Freq: Two times a day (BID) | ORAL | 3 refills | Status: AC

## 2024-04-29 NOTE — Telephone Encounter (Signed)
 Pt c/o medication issue:  1. Name of Medication: propafenone  (RYTHMOL  SR) 325 MG 12 hr capsule   2. How are you currently taking this medication (dosage and times per day)?   Take 1 capsule (325 mg total) by mouth 2 (two) times daily.    3. Are you having a reaction (difficulty breathing--STAT)? Jittery, dizzy   4. What is your medication issue? Pt states this med increase made her feel bad. She wants to know if she can reduce dose. Please advise.

## 2024-04-29 NOTE — Telephone Encounter (Signed)
 Patient verbalized she been taking Propafenone  325mg  twice a day and patient reports  the medication causing jittery and dizziness. Per patient she took  a dose this morning and symptoms started. Per patient she was fine while taking Propafenone  225 and had no symptoms. Patient would like recommendation and advice on decrease dose back to propafenone  225 mg twice a day. Made patient aware that this will be forward to provider for advice and recommendations.

## 2024-04-29 NOTE — Telephone Encounter (Signed)
 Called and made patient aware to go back on Propafenone  225 twice a day per Daphne Barrack, NP. Patient verbalized an understanding.

## 2024-05-06 ENCOUNTER — Ambulatory Visit: Attending: Student | Admitting: Student

## 2024-05-06 ENCOUNTER — Encounter: Payer: Self-pay | Admitting: Student

## 2024-05-06 VITALS — BP 134/86 | HR 90 | Ht 63.0 in | Wt 138.8 lb

## 2024-05-06 DIAGNOSIS — I471 Supraventricular tachycardia, unspecified: Secondary | ICD-10-CM | POA: Diagnosis not present

## 2024-05-06 DIAGNOSIS — R002 Palpitations: Secondary | ICD-10-CM

## 2024-05-06 DIAGNOSIS — I10 Essential (primary) hypertension: Secondary | ICD-10-CM

## 2024-05-06 DIAGNOSIS — I4892 Unspecified atrial flutter: Secondary | ICD-10-CM

## 2024-05-06 NOTE — Patient Instructions (Signed)
 Medication Instructions:  No medication changes today. *If you need a refill on your cardiac medications before your next appointment, please call your pharmacy*  Lab Work: No labwork ordered today. If you have labs (blood work) drawn today and your tests are completely normal, you will receive your results only by: MyChart Message (if you have MyChart) OR A paper copy in the mail If you have any lab test that is abnormal or we need to change your treatment, we will call you to review the results.  Testing/Procedures: No testing ordered today  Follow-Up: At [facility], you and your health needs are our priority.  As part of our continuing mission to provide you with exceptional heart care, our providers are all part of one team.  This team includes your primary Cardiologist (physician) and Advanced Practice Providers or APPs (Physician Assistants and Nurse Practitioners) who all work together to provide you with the care you need, when you need it.  Your next appointment:   3-4 month(s)  Provider:   Will Gladis Norton, MD  We recommend signing up for the patient portal called MyChart.  Sign up information is provided on this After Visit Summary.  MyChart is used to connect with patients for Virtual Visits (Telemedicine).  Patients are able to view lab/test results, encounter notes, upcoming appointments, etc.  Non-urgent messages can be sent to your provider as well.   To learn more about what you can do with MyChart, go to [email or web address].

## 2024-05-06 NOTE — Progress Notes (Signed)
" °  Electrophysiology Office Note:   Date:  05/06/2024  ID:  Grace George, DOB 30-Aug-1960, MRN [id number]  Primary Cardiologist: Lonni LITTIE Nanas, MD Electrophysiologist: Will Gladis Norton, MD   Electrophysiologist:  Soyla Gladis Norton, MD      History of Present Illness:   Grace George is a 64 y.o. female with h/o PAC's, AFL, HTN, HLD, anxiety seen today for routine electrophysiology followup.   Pt called 12/24 reporting elevated HR's and blood pressure. She noted she had missed one dose of Eliquis  in the last 4-5 days. She had not used her [facility] to check the rhythm. ER precautions were given.  Seen 12/26 and Propafenone  increased.  Subsequently called on 04/29/2024 and decreased back down to 225 mg BID due to dizziness.   Since last being seen in our clinic the patient reports doing better. Palpitations have improved as the stress from the holidays have. No chest pain or undue SOB. Has started taking the Xanax  more regularly which has also improved her symptoms.   Review of systems complete and found to be negative unless listed in HPI.   EP Information / Studies Reviewed:    EKG is not ordered today. EKG from 04/22/2024 reviewed which showed NSR with  1AVB at 81 bpm       Arrhythmia/Device History No specialty comments available.   Physical Exam:   VS:  BP 134/86   Pulse 90   Ht 5' 3 (1.6 m)   Wt 138 lb 12.8 oz (63 kg)   LMP 03/28/2010   SpO2 99%   BMI 24.59 kg/m    Wt Readings from Last 3 Encounters:  05/06/24 138 lb 12.8 oz (63 kg)  04/22/24 141 lb 6.4 oz (64.1 kg)  02/18/24 142 lb 9.6 oz (64.7 kg)     GEN: No acute distress NECK: No JVD; No carotid bruits CARDIAC: Regular rate and rhythm, no murmurs, rubs, gallops RESPIRATORY:  Clear to auscultation without rales, wheezing or rhonchi  ABDOMEN: Soft, non-tender, non-distended EXTREMITIES:  No edema; No deformity   ASSESSMENT AND PLAN:    Typical Atrial Flutter  PAC's / PVC's / SVT  1st  Degree AVB  High Risk Medication Monitoring: Propafenone   Secondary hypercoagulable state 1% burden on recent monitor, brief episodes, CHA2DS2-VASc 2 Regular on exam Continue propafenone  225 mg BID  Pt prefers to continue OAC for now and return in several months to reconsider stopping.  Continue Eliquis  5mg  BID, dose reviewed and appropriate by age / wt    HTN Initially elevated, improved on recheck.    Situational Stress  Re-iterated PCP visit to discuss stress management strategies as it appears to be impacting her BP / HR at times  She is taking an old Xanax  Rx currently to help Pt may benefit from SSRI.  Follow up with Dr. Norton in 3-4 months to discuss whether needs OAC long term.   Signed, Ozell Prentice Passey, PA-C  "

## 2024-05-16 ENCOUNTER — Ambulatory Visit: Admitting: Internal Medicine

## 2024-05-16 ENCOUNTER — Encounter: Payer: Self-pay | Admitting: Internal Medicine

## 2024-05-16 VITALS — BP 154/70 | HR 73 | Temp 97.8°F | Ht 63.0 in | Wt 138.2 lb

## 2024-05-16 DIAGNOSIS — J309 Allergic rhinitis, unspecified: Secondary | ICD-10-CM

## 2024-05-16 DIAGNOSIS — Z23 Encounter for immunization: Secondary | ICD-10-CM | POA: Diagnosis not present

## 2024-05-16 DIAGNOSIS — F418 Other specified anxiety disorders: Secondary | ICD-10-CM

## 2024-05-16 MED ORDER — ALPRAZOLAM 0.5 MG PO TABS: 0.5000 mg | ORAL_TABLET | Freq: Every day | ORAL | 1 refills | Status: AC | PRN

## 2024-05-16 MED ORDER — FLUTICASONE PROPIONATE 50 MCG/ACT NA SUSP: 2.0000 | Freq: Every day | NASAL | 6 refills | Status: AC

## 2024-05-16 NOTE — Patient Instructions (Signed)
 We have sent in the alprazolam  for you and the flonase .

## 2024-05-16 NOTE — Addendum Note (Signed)
 Addended by: EZZARD EDSEL HERO on: 05/16/2024 03:22 PM   Modules accepted: Orders

## 2024-05-16 NOTE — Progress Notes (Signed)
 "  Subjective:   Patient ID: Grace George, female    DOB: 1961/04/14, 64 y.o.   MRN: [id number]  Discussed the use of AI scribe software for clinical note transcription with the patient, who gave verbal consent to proceed.  History of Present Illness Grace George is a 64 year old female who presents with worsening allergy symptoms and anxiety.  She has been experiencing significant allergy symptoms, including congestion and drainage, which she attributes to her allergies rather than a cold. She has been using levocetirizine (Xyzal ) but feels it may not be as effective as before. These symptoms have been particularly bothersome recently.  Over the past three months, she has experienced increased anxiety and mood disturbances, which she attributes to life stressors, including her husband's illness. She describes feeling 'like my nerves are just kind of shot' and has been experiencing poor sleep, which exacerbates her mood issues. She has been using alprazolam  intermittently for anxiety, which she started about a month ago, but notes that her symptoms have been worsening over the past three months.  She mentions a history of neuropathy, currently characterized by numbness without pain. She recalls being prescribed duloxetine in the past by a neurologist, which she took daily for a period.  She has a history of using escitalopram  in 2014, which she discontinued due to significant weight gain. She expresses concern about using antidepressants due to this past experience.  No chest pain, breathing difficulties, stomach issues, diarrhea, constipation, heartburn, or blood in stools.  Review of Systems  Constitutional: Negative.   HENT:  Positive for postnasal drip and rhinorrhea.   Eyes: Negative.   Respiratory:  Negative for cough, chest tightness and shortness of breath.   Cardiovascular:  Negative for chest pain, palpitations and leg swelling.  Gastrointestinal:  Negative for abdominal  distention, abdominal pain, constipation, diarrhea, nausea and vomiting.  Musculoskeletal: Negative.   Skin: Negative.   Neurological: Negative.   Psychiatric/Behavioral:  Positive for dysphoric mood and sleep disturbance. The patient is nervous/anxious.     Objective:  Physical Exam Constitutional:      Appearance: She is well-developed.  HENT:     Head: Normocephalic and atraumatic.  Cardiovascular:     Rate and Rhythm: Normal rate and regular rhythm.  Pulmonary:     Effort: Pulmonary effort is normal. No respiratory distress.     Breath sounds: Normal breath sounds. No wheezing or rales.  Abdominal:     General: Bowel sounds are normal. There is no distension.     Palpations: Abdomen is soft.     Tenderness: There is no abdominal tenderness.  Musculoskeletal:     Cervical back: Normal range of motion.  Skin:    General: Skin is warm and dry.  Neurological:     Mental Status: She is alert and oriented to person, place, and time.     Coordination: Coordination normal.     Vitals:   05/16/24 1258 05/16/24 1305  BP: (!) 150/70 (!) 154/70  Pulse: 73   Temp: 97.8 F (36.6 C)   TempSrc: Oral   SpO2: 99%   Weight: 138 lb 3.2 oz (62.7 kg)   Height: 5' 3 (1.6 m)    Prevnar 20 given at visit Assessment and Plan Assessment & Plan Anxiety disorder   She experiences increased anxiety and mood disturbances due to her husband's illness and sleep deprivation. Previous antidepressant lexapro  caused weight gain. Alprazolam  is used as needed but is not ideal for long-term use. Duloxetine was  discussed for anxiety and neuropathy, but she reports only numbness. Non-pharmacological interventions were encouraged. Refilled alprazolam  for as-needed use. Encouraged physical activity and non-pharmacological coping strategies. Discussed potential for counseling or therapy if needed.  Allergic rhinitis   She has persistent sinus congestion and drainage despite levocetirizine use, suggesting  allergic rhinitis. Prescribed Flonase  nasal spray for additional symptom relief.    "

## 2024-08-17 ENCOUNTER — Ambulatory Visit: Admitting: Cardiology

## 2024-09-14 ENCOUNTER — Encounter (INDEPENDENT_AMBULATORY_CARE_PROVIDER_SITE_OTHER): Admitting: Ophthalmology
